# Patient Record
Sex: Female | Born: 1935 | Race: White | Hispanic: No | Smoking: Former smoker
Health system: Southern US, Community
[De-identification: ages and names within clinical notes are randomized; demographics above are authoritative.]

## PROBLEM LIST (undated history)

## (undated) DIAGNOSIS — K224 Dyskinesia of esophagus: Secondary | ICD-10-CM

## (undated) DIAGNOSIS — K222 Esophageal obstruction: Secondary | ICD-10-CM

## (undated) DIAGNOSIS — C4490 Unspecified malignant neoplasm of skin, unspecified: Secondary | ICD-10-CM

## (undated) DIAGNOSIS — Z973 Presence of spectacles and contact lenses: Secondary | ICD-10-CM

## (undated) DIAGNOSIS — E119 Type 2 diabetes mellitus without complications: Secondary | ICD-10-CM

## (undated) DIAGNOSIS — Z972 Presence of dental prosthetic device (complete) (partial): Secondary | ICD-10-CM

## (undated) DIAGNOSIS — K5190 Ulcerative colitis, unspecified, without complications: Secondary | ICD-10-CM

## (undated) DIAGNOSIS — K449 Diaphragmatic hernia without obstruction or gangrene: Secondary | ICD-10-CM

## (undated) DIAGNOSIS — K2900 Acute gastritis without bleeding: Secondary | ICD-10-CM

## (undated) DIAGNOSIS — E785 Hyperlipidemia, unspecified: Secondary | ICD-10-CM

## (undated) DIAGNOSIS — K219 Gastro-esophageal reflux disease without esophagitis: Secondary | ICD-10-CM

## (undated) DIAGNOSIS — I10 Essential (primary) hypertension: Secondary | ICD-10-CM

## (undated) DIAGNOSIS — Z86010 Personal history of colonic polyps: Secondary | ICD-10-CM

## (undated) DIAGNOSIS — K5730 Diverticulosis of large intestine without perforation or abscess without bleeding: Secondary | ICD-10-CM

## (undated) DIAGNOSIS — F0390 Unspecified dementia without behavioral disturbance: Secondary | ICD-10-CM

## (undated) HISTORY — DX: Dyskinesia of esophagus: K22.4

## (undated) HISTORY — DX: Gastro-esophageal reflux disease without esophagitis: K21.9

## (undated) HISTORY — DX: Unspecified dementia, unspecified severity, without behavioral disturbance, psychotic disturbance, mood disturbance, and anxiety: F03.90

## (undated) HISTORY — DX: Diaphragmatic hernia without obstruction or gangrene: K44.9

## (undated) HISTORY — DX: Diverticulosis of large intestine without perforation or abscess without bleeding: K57.30

## (undated) HISTORY — PX: THUMB ARTHROSCOPY: SHX2509

## (undated) HISTORY — PX: TUBAL LIGATION: SHX77

## (undated) HISTORY — PX: BUNIONECTOMY: SHX129

## (undated) HISTORY — DX: Essential (primary) hypertension: I10

## (undated) HISTORY — DX: Acute gastritis without bleeding: K29.00

## (undated) HISTORY — PX: NASAL SINUS SURGERY: SHX719

## (undated) HISTORY — DX: Ulcerative colitis, unspecified, without complications: K51.90

## (undated) HISTORY — DX: Personal history of colonic polyps: Z86.010

## (undated) HISTORY — PX: CATARACT EXTRACTION: SUR2

## (undated) HISTORY — DX: Esophageal obstruction: K22.2

## (undated) HISTORY — DX: Hyperlipidemia, unspecified: E78.5

---

## 1998-09-18 ENCOUNTER — Encounter: Payer: Self-pay | Admitting: Otolaryngology

## 1998-09-18 ENCOUNTER — Ambulatory Visit (HOSPITAL_COMMUNITY): Admission: RE | Admit: 1998-09-18 | Discharge: 1998-09-18 | Payer: Self-pay | Admitting: Otolaryngology

## 1999-02-24 ENCOUNTER — Other Ambulatory Visit: Admission: RE | Admit: 1999-02-24 | Discharge: 1999-02-24 | Payer: Self-pay | Admitting: Gastroenterology

## 1999-05-11 ENCOUNTER — Ambulatory Visit (HOSPITAL_COMMUNITY): Admission: RE | Admit: 1999-05-11 | Discharge: 1999-05-11 | Payer: Self-pay | Admitting: Obstetrics & Gynecology

## 2003-04-05 ENCOUNTER — Other Ambulatory Visit: Admission: RE | Admit: 2003-04-05 | Discharge: 2003-04-05 | Payer: Self-pay | Admitting: Family Medicine

## 2004-09-24 ENCOUNTER — Ambulatory Visit: Payer: Self-pay | Admitting: Gastroenterology

## 2006-03-30 ENCOUNTER — Ambulatory Visit: Payer: Self-pay | Admitting: Gastroenterology

## 2006-04-27 ENCOUNTER — Other Ambulatory Visit: Admission: RE | Admit: 2006-04-27 | Discharge: 2006-04-27 | Payer: Self-pay | Admitting: Family Medicine

## 2006-04-27 ENCOUNTER — Ambulatory Visit: Payer: Self-pay | Admitting: Gastroenterology

## 2006-05-17 ENCOUNTER — Encounter (INDEPENDENT_AMBULATORY_CARE_PROVIDER_SITE_OTHER): Payer: Self-pay | Admitting: Specialist

## 2006-05-17 ENCOUNTER — Ambulatory Visit: Payer: Self-pay | Admitting: Gastroenterology

## 2006-05-17 DIAGNOSIS — K449 Diaphragmatic hernia without obstruction or gangrene: Secondary | ICD-10-CM | POA: Insufficient documentation

## 2006-05-17 DIAGNOSIS — K222 Esophageal obstruction: Secondary | ICD-10-CM | POA: Insufficient documentation

## 2006-05-17 DIAGNOSIS — K5190 Ulcerative colitis, unspecified, without complications: Secondary | ICD-10-CM | POA: Insufficient documentation

## 2006-05-17 DIAGNOSIS — K5730 Diverticulosis of large intestine without perforation or abscess without bleeding: Secondary | ICD-10-CM | POA: Insufficient documentation

## 2006-06-08 ENCOUNTER — Ambulatory Visit: Payer: Self-pay | Admitting: Gastroenterology

## 2006-10-20 ENCOUNTER — Encounter: Admission: RE | Admit: 2006-10-20 | Discharge: 2006-10-20 | Payer: Self-pay | Admitting: Family Medicine

## 2007-02-06 ENCOUNTER — Ambulatory Visit: Payer: Self-pay | Admitting: Internal Medicine

## 2007-03-07 ENCOUNTER — Ambulatory Visit: Payer: Self-pay | Admitting: Internal Medicine

## 2007-03-24 ENCOUNTER — Ambulatory Visit: Payer: Self-pay | Admitting: Cardiology

## 2007-04-11 ENCOUNTER — Ambulatory Visit: Payer: Self-pay | Admitting: Internal Medicine

## 2007-06-20 ENCOUNTER — Ambulatory Visit: Payer: Self-pay | Admitting: Gastroenterology

## 2008-02-12 DIAGNOSIS — I10 Essential (primary) hypertension: Secondary | ICD-10-CM | POA: Insufficient documentation

## 2008-02-12 DIAGNOSIS — E785 Hyperlipidemia, unspecified: Secondary | ICD-10-CM | POA: Insufficient documentation

## 2008-02-12 DIAGNOSIS — K219 Gastro-esophageal reflux disease without esophagitis: Secondary | ICD-10-CM | POA: Insufficient documentation

## 2008-05-08 ENCOUNTER — Ambulatory Visit: Payer: Self-pay | Admitting: Gastroenterology

## 2008-05-14 ENCOUNTER — Ambulatory Visit: Payer: Self-pay | Admitting: Gastroenterology

## 2008-05-20 ENCOUNTER — Encounter: Payer: Self-pay | Admitting: Gastroenterology

## 2008-05-20 ENCOUNTER — Ambulatory Visit: Payer: Self-pay | Admitting: Gastroenterology

## 2008-05-23 ENCOUNTER — Telehealth: Payer: Self-pay | Admitting: Gastroenterology

## 2008-05-27 ENCOUNTER — Encounter: Payer: Self-pay | Admitting: Gastroenterology

## 2008-06-04 ENCOUNTER — Encounter: Payer: Self-pay | Admitting: Gastroenterology

## 2008-06-06 ENCOUNTER — Other Ambulatory Visit: Admission: RE | Admit: 2008-06-06 | Discharge: 2008-06-06 | Payer: Self-pay | Admitting: Family Medicine

## 2008-11-22 ENCOUNTER — Telehealth (INDEPENDENT_AMBULATORY_CARE_PROVIDER_SITE_OTHER): Payer: Self-pay | Admitting: *Deleted

## 2008-11-22 ENCOUNTER — Telehealth: Payer: Self-pay | Admitting: Gastroenterology

## 2008-12-02 ENCOUNTER — Telehealth: Payer: Self-pay | Admitting: Gastroenterology

## 2008-12-13 ENCOUNTER — Telehealth: Payer: Self-pay | Admitting: Gastroenterology

## 2009-05-30 ENCOUNTER — Telehealth: Payer: Self-pay | Admitting: Gastroenterology

## 2009-06-02 ENCOUNTER — Telehealth: Payer: Self-pay | Admitting: Gastroenterology

## 2009-06-12 ENCOUNTER — Ambulatory Visit: Payer: Self-pay | Admitting: Gastroenterology

## 2009-09-02 ENCOUNTER — Telehealth: Payer: Self-pay | Admitting: Gastroenterology

## 2009-10-17 ENCOUNTER — Emergency Department (HOSPITAL_COMMUNITY): Admission: EM | Admit: 2009-10-17 | Discharge: 2009-10-17 | Payer: Self-pay | Admitting: Family Medicine

## 2010-04-08 ENCOUNTER — Telehealth: Payer: Self-pay | Admitting: Gastroenterology

## 2010-06-17 ENCOUNTER — Encounter (INDEPENDENT_AMBULATORY_CARE_PROVIDER_SITE_OTHER): Payer: Self-pay | Admitting: *Deleted

## 2010-06-24 ENCOUNTER — Telehealth: Payer: Self-pay | Admitting: Gastroenterology

## 2010-07-20 ENCOUNTER — Encounter: Payer: Self-pay | Admitting: Gastroenterology

## 2010-07-21 ENCOUNTER — Encounter (INDEPENDENT_AMBULATORY_CARE_PROVIDER_SITE_OTHER): Payer: Self-pay | Admitting: *Deleted

## 2010-07-21 ENCOUNTER — Ambulatory Visit: Payer: Self-pay | Admitting: Gastroenterology

## 2010-09-11 ENCOUNTER — Ambulatory Visit: Payer: Self-pay | Admitting: Gastroenterology

## 2010-09-11 DIAGNOSIS — Z860100 Personal history of colon polyps, unspecified: Secondary | ICD-10-CM

## 2010-09-11 DIAGNOSIS — K2900 Acute gastritis without bleeding: Secondary | ICD-10-CM | POA: Insufficient documentation

## 2010-09-11 HISTORY — DX: Personal history of colonic polyps: Z86.010

## 2010-09-11 HISTORY — DX: Personal history of colon polyps, unspecified: Z86.0100

## 2010-09-11 LAB — CONVERTED CEMR LAB: UREASE: NEGATIVE

## 2010-09-15 ENCOUNTER — Encounter: Payer: Self-pay | Admitting: Gastroenterology

## 2010-09-29 ENCOUNTER — Telehealth: Payer: Self-pay | Admitting: Gastroenterology

## 2010-12-10 NOTE — Letter (Signed)
Summary: [facility] Instructions  [facility]  [place], [place] [address]   Phone: [phone]  Fax: [phone]       JOZLYNN PLAIA    22-Jan-1935    MRN: [id number]        Procedure Day /Date: Friday 09/11/2010     Arrival Time: 1:30pm     Procedure Time: 2:30pm    Location of Procedure:                    X  [facility] (4th Floor)  [place]   Starting 5 days prior to your procedure 09/06/2010 do not eat nuts, seeds, popcorn, corn, beans, peas,  salads, or any raw vegetables.  Do not take any fiber supplements (e.g. Metamucil, Citrucel, and Benefiber).  THE DAY BEFORE YOUR PROCEDURE         DATE: 09/10/2010  DAY: Thursday  1.  Drink clear liquids the entire day-NO SOLID FOOD  2.  Do not drink anything colored red or purple.  Avoid juices with pulp.  No orange juice.  3.  Drink at least 64 oz. (8 glasses) of fluid/clear liquids during the day to prevent dehydration and help the prep work efficiently.  CLEAR LIQUIDS INCLUDE: Water Jello Ice Popsicles Tea (sugar ok, no milk/cream) Powdered fruit flavored drinks Coffee (sugar ok, no milk/cream) Gatorade Juice: apple, white grape, white cranberry  Lemonade Clear bullion, consomm, broth Carbonated beverages (any kind) Strained chicken noodle soup Hard Candy                             4.  In the morning, mix first dose of MoviPrep solution:    Empty 1 Pouch A and 1 Pouch B into the disposable container    Add lukewarm drinking water to the top line of the container. Mix to dissolve    Refrigerate (mixed solution should be used within 24 hrs)  5.  Begin drinking the prep at 5:00 p.m. The MoviPrep container is divided by 4 marks.   Every 15 minutes drink the solution down to the next mark (approximately 8 oz) until the full liter is complete.   6.  Follow completed prep with 16 oz of clear liquid of your choice (Nothing red or purple).   Continue to drink clear liquids until bedtime.  7.  Before going to bed, mix second dose of MoviPrep solution:    Empty 1 Pouch A and 1 Pouch B into the disposable container    Add lukewarm drinking water to the top line of the container. Mix to dissolve    Refrigerate  THE DAY OF YOUR PROCEDURE      DATE: 09/11/2010 DAY: Friday  Beginning at 9:30am (5 hours before procedure):         1. Every 15 minutes, drink the solution down to the next mark (approx 8 oz) until the full liter is complete.  2. Follow completed prep with 16 oz. of clear liquid of your choice.    3. You may drink clear liquids until 12:30pm (2 HOURS BEFORE PROCEDURE).   MEDICATION INSTRUCTIONS  Unless otherwise instructed, you should take regular prescription medications with a small sip of water   as early as possible the morning of your procedure.          OTHER INSTRUCTIONS  You will need a responsible adult at least 74 years of age to accompany  you and drive you home.   This person must remain in the waiting room during your procedure.  Wear loose fitting clothing that is easily removed.  Leave jewelry and other valuables at home.  However, you may wish to bring a book to read or  an iPod/MP3 player to listen to music as you wait for your procedure to start.  Remove all body piercing jewelry and leave at home.  Total time from sign-in until discharge is approximately 2-3 hours.  You should go home directly after your procedure and rest.  You can resume normal activities the  day after your procedure.  The day of your procedure you should not:   Drive   Make legal decisions   Operate machinery   Drink alcohol   Return to work  You will receive specific instructions about eating, activities and medications before you leave.    The above instructions have been reviewed and explained to me by   _______________________    I fully understand and can verbalize these instructions  _____________________________ Date _________

## 2010-12-10 NOTE — Assessment & Plan Note (Signed)
Summary: 1 yr follow up/dfs   History of Present Illness Visit Type: Follow-up Visit Primary GI MD: Verl Blalock MD [place] Primary Provider: Serita Grammes, MD Chief Complaint: pt needs refill on Domperidone.  Pt also states she is having a lot of acid reflux on Lansoprazole History of Present Illness:   Progressive solid food dysphagia despite daily Prevacid and domperidone. 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 She denies abdominal pain, melena or hematochezia. Recent blood work has been performed at Dr. Wilburt Finlay. Last endoscopy and colonoscopy were in 2007.   GI Review of Systems    Reports acid reflux.      Denies abdominal pain, belching, bloating, chest pain, dysphagia with liquids, dysphagia with solids, heartburn, loss of appetite, nausea, vomiting, vomiting blood, weight loss, and  weight gain.        Denies anal fissure, black tarry stools, change in bowel habit, constipation, diarrhea, diverticulosis, fecal incontinence, heme positive stool, hemorrhoids, irritable bowel syndrome, jaundice, light color stool, liver problems, rectal bleeding, and  rectal pain.    Current Medications (verified): 1)  Pentasa 250 Mg  Cpcr (Mesalamine) .... Take Three By Mouth  A Day. 2)  Centrum Silver   Tabs (Multiple Vitamins-Minerals) .... One By Mouth Once Daily 3)  Calcium 500 Mg  Tabs (Calcium Carbonate) .... One By Mouth Once Daily 4)  Losartan Potassium 100 Mg Tabs (Losartan Potassium) .... Take 1 Tablet By Mouth Once A Day 5)  Domperidone 80m .... One By Mouth Three Times A Day Before Meals 6)  Lansoprazole 30 Mg Cpdr (Lansoprazole) .... Take One By Mouth Once Daily 7)  Lipitor 20 Mg Tabs (Atorvastatin Calcium) .... Take 1 Tablet By Mouth Once A Day 8)  Amlodipine Besylate 5 Mg Tabs (Amlodipine Besylate) .... Take 1 Tablet By Mouth Once A Day 9)  Vitamin C 500 Mg Tabs (Ascorbic Acid) .... Take 1 Tablet By Mouth Once A Day 10)  Vitamin D 1000 Unit  Tabs (Cholecalciferol) .... Take 1 Tablet By Mouth Once A Day 11)  Reclast 5 Mg/1043mSoln (Zoledronic Acid) .... Take One Injection Once Yearly  Allergies (verified): 1)  ! Codeine  Past History:  Past medical, surgical, family and social histories (including risk factors) reviewed for relevance to current acute and chronic problems.  Past Medical History: Reviewed history from 02/12/2008 and no changes required. 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 No FH of Colon Cancer: Family History of Thyroid Cancer: Brother ? mets to brain Family History of Diabetes: Mother, Sister  Social History: Reviewed history from 05/14/2008 and no changes required. married, 1 boy, 1 girl Occupation: semi-retired Patient is a former smoker.  Alcohol Use - no Daily Caffeine Use 1 cup/day Illicit Drug Use - no Patient does not get regular exercise.   Review of Systems       The patient complains of arthritis/joint pain.  The patient denies allergy/sinus, anemia, anxiety-new, back pain, blood in urine, breast changes/lumps, confusion, cough, coughing up blood, depression-new, fainting, fatigue, fever, headaches-new, hearing problems, heart murmur, heart rhythm changes, itching, menstrual pain, muscle pains/cramps, night sweats, nosebleeds, pregnancy symptoms, shortness of breath, skin rash, sleeping problems, sore throat, swelling of feet/legs, swollen lymph glands, thirst - excessive, urination - excessive, urination changes/pain, urine leakage,  vision changes, and voice change.         Recently prescribed Celebrex for hand arthritis.  Vital Signs:  Patient  profile:   76 year old female Height:      63 inches Weight:      139 pounds BMI:     24.71 Pulse rate:   76 / minute Pulse rhythm:   regular BP sitting:   130 / 72  (left arm) Cuff size:   regular  Vitals Entered By: Abelino Derrick CMA Deborra Medina) (July 21, 2010 11:39 AM)  Physical Exam  General:  Well developed, well nourished, no acute distress.healthy appearing.   Head:  Normocephalic and atraumatic. Eyes:  PERRLA, no icterus.exam deferred to patient's ophthalmologist.   Lungs:  Clear throughout to auscultation. Heart:  Regular rate and rhythm; no murmurs, rubs,  or bruits. Abdomen:  Soft, nontender and nondistended. No masses, hepatosplenomegaly or hernias noted. Normal bowel sounds. Rectal:  deferred until time of colonoscopy.   Msk:  arthritic changes.   Extremities:  No clubbing, cyanosis, edema or deformities noted. Neurologic:  Alert and  oriented x4;  grossly normal neurologically. Psych:  Alert and cooperative. Normal mood and affect.   Impression & Recommendations:  Problem # 1:  GERD (ICD-530.81) Assessment Deteriorated Change to Dexilant 60 mg a day and add scheduled followup endoscopy and esophageal dilatation. She also can stop her domperidone since she has had no response to this medication. Orders: Colon/Endo (Colon/Endo)  Problem # 2:  COLITIS, ULCERATIVE (ICD-556.9) Assessment: Improved Schedule Colonoscopy with dysplasia screening biopsies. Continued Pentasa at current doses. Labs requested from Dr. Raul Del office. I see no problem with her using p.o. Celebrex a conventional doses. Orders: Colon/Endo (Colon/Endo)  Patient Instructions: 1)  Copy sent to : Serita Grammes, MD 2)  Stop Domperidone and Prevacid.  3)  Try the samples of Dexilant 51m one by mouth once daily, samples provided. 4)  Your procedure has been scheduled for 09/11/2010, please follow the seperate instructions.  5)  Your prescription(s) have been sent to you pharmacy.  6)  The  medication list was reviewed and reconciled.  All changed / newly prescribed medications were explained.  A complete medication list was provided to the patient / caregiver. Prescriptions: MOVIPREP 100 GM  SOLR (PEG-KCL-NACL-NASULF-NA ASC-C) As per prep instructions.  #1 x 0   Entered by:   LBernita BuffyCMA (A[place]. Bernard   Authorized by:   DSable FeilMD [facility]  Signed by:   LBernita BuffyCMA ([place] on 07/21/2010   Method used:   [facility]ronically to        [facility]       [address] [place]  [address]      Ph: [phone]      Fax: [phone]  RxID:   [id number]

## 2010-12-10 NOTE — Progress Notes (Signed)
Summary: rx refill   Phone Note From Pharmacy Call back at [phone]   Caller: susie [facility] [place]  Call For: [facility]  Summary of Call: Patient needs rx refill for Domperidon Initial call taken by: Ronalee Red,  June 24, 2010 1:17 PM  Follow-up for Phone Call        Rx faxed to [facility] [place] at [phone].   Pt needs to sch OV.   Appt scheduled.  Follow-up by: Alberteen Spindle RN,  June 24, 2010 3:06 PM    Prescriptions: DOMPERIDONE 10MG one by mouth three times a day before meals  #100 x 3   Entered by:   Alberteen Spindle RN   Authorized by:   Sable Feil MD [facility]   Signed by:   Alberteen Spindle RN on 06/24/2010   Method used:   Printed then faxed to ...       [facility] (retail)       [address] [place], [place]  [address]       Ph: [phone] or [phone]       Fax: [phone]   RxID:   [id number]

## 2010-12-10 NOTE — Procedures (Signed)
Summary: Colonoscopy  Patient: Erica Carter Note: All result statuses are Final unless otherwise noted.  Tests: (1) Colonoscopy (COL)   COL Colonoscopy           [facility].     [place], [place]  [address]           COLONOSCOPY PROCEDURE REPORT           PATIENT:  Erica Carter, Erica Carter  MR#:  [id number]     BIRTHDATE:  [birth year], 75 yrs. old  GENDER:  female     ENDOSCOPIST:  Loralee Pacas. Sharlett Iles, MD, [facility]     REF. BY:  Shon Baton, M.D.     [name], M.D.     PROCEDURE DATE:  09/11/2010     PROCEDURE:  Colonoscopy with biopsy     ASA CLASS:  Class II     INDICATIONS:  CHRONIC ULCERATIVE COLITIS.DYSPLASIA SCREENING EXAM.           MEDICATIONS:   Fentanyl 50 mcg IV, Versed 6 mg IV           DESCRIPTION OF PROCEDURE:   After the risks benefits and     alternatives of the procedure were thoroughly explained, informed     consent was obtained.  Digital rectal exam was performed and     revealed no abnormalities.   The LB CF-H180AL [id number] endoscope     was introduced through the anus and advanced to the cecum, which     was identified by both the appendix and ileocecal valve, limited     by a redundant colon, poor preparation.    The quality of the prep     was adequate, using MoviPrep.  The instrument was then slowly     withdrawn as the colon was fully examined.     <<PROCEDUREIMAGES>>           FINDINGS:  pseudopolyps in the right colon. MULTIPLE FLAT     PSEUDOPOLYPS BIOPSIED.SEE PICTURES.NO ACTIVE COLITIS.  other     finding. ATROPHIC SCARRED MUCOSA IN TV AND LEFT     COLON.BIOPSIED.MINIMAL INFLAMMATION NOTED AND SCATTERED     PSEUDOPOLYPS ALSO NOTED.  Severe diverticulosis was found in the     sigmoid to descending colon segments.   Retroflexed views in the     rectum revealed internal hemorrhoids.    The scope was then     withdrawn from the patient and the procedure completed.           COMPLICATIONS:  None     ENDOSCOPIC  IMPRESSION:     1) Pseudopolyps in the right colon     2) Other finding     3) Severe diverticulosis in the sigmoid to descending colon     segments     4) Internal hemorrhoids     CHRONIC UC.R/O DYSPLASIA.     RECOMMENDATIONS:     1) high fiber diet     2) Await pathology results     3) Continue current medications     4) Repeat Colonoscopy in 3 years.     REPEAT EXAM:  No           ______________________________     Loralee Pacas. Sharlett Iles, MD, [facility]           CC:           n.     eSIGNED:   Loralee Pacas.  Patterson at 09/11/2010 03:07 PM           Elicia Lamp, [id number]  Note: An exclamation mark (!) indicates a result that was not dispersed into the flowsheet. Document Creation Date: 09/11/2010 3:08 PM _______________________________________________________________________  (1) Order result status: Final Collection or observation date-time: 09/11/2010 14:53 Requested date-time:  Receipt date-time:  Reported date-time:  Referring Physician:   Ordering Physician: Verl Blalock [id number]) Specimen Source:  Source: Tawanna Cooler Order Number: [id number] Lab site:   Appended Document: Colonoscopy     Procedures Next Due Date:    Colonoscopy: 09/2015

## 2010-12-10 NOTE — Progress Notes (Signed)
Summary: Lansoprazole Refill   Phone Note From Pharmacy   Summary of Call: [facility] fax received requesting refill on lansoprazole 30 mg #90. Initial call taken by: Alberteen Spindle RN,  April 08, 2010 3:47 PM    Prescriptions: LANSOPRAZOLE 30 MG CPDR (LANSOPRAZOLE) take one by mouth once daily  #90 x 4   Entered by:   Alberteen Spindle RN   Authorized by:   Sable Feil MD [facility]   Signed by:   Alberteen Spindle RN on 04/08/2010   Method used:   Electronically to        [facility] (mail-order)             ,          Ph: [phone]       Fax: [phone]   RxID:   [id number]

## 2010-12-10 NOTE — Letter (Signed)
Summary: Patient [facility] Biopsy Results  [facility]  [place], [place] [address]   Phone: [phone]  Fax: [phone]        September 15, 2010 MRN: [id number]    [place], [place]  [address]    Dear Ms. Jaworski,  I am pleased to inform you that the biopsies taken during your recent endoscopic examination did not show any evidence of cancer upon pathologic examination.  Additional information/recommendations:  __No further action is needed at this time.  Please follow-up with      your primary care physician for your other healthcare needs.  __ Please call [phone] to schedule a return visit to review      your condition.  x__ Continue with the treatment plan as outlined on the day of your      exam.  __ You should have a repeat endoscopic examination for this problem              in _ months/years.   Please call us if you are having persistent problems or have questions about your condition that have not been fully answered at this time.  Sincerely,  Sable Feil MD [facility]  This letter has been electronically signed by your physician.  Appended Document: Patient Notice-Endo Biopsy Results letter mailed

## 2010-12-10 NOTE — Progress Notes (Signed)
Summary: rx ?'s   Phone Note Call from Patient Call back at Home Phone [phone]   Caller: Patient Call For: Dr. Sharlett Iles Reason for Call: Talk to Nurse Summary of Call: ?'s regarding Pentasa refill Initial call taken by: Lucien Mons,  September 29, 2010 11:12 AM  Follow-up for Phone Call        rx resent to [facility]. Follow-up by: Bernita Buffy CMA Deborra Medina),  September 29, 2010 11:33 AM    Prescriptions: PENTASA 250 MG  CPCR (MESALAMINE) take three by mouth  a day.  #270 Capsule x 2   Entered by:   Bernita Buffy CMA ([place])   Authorized by:   Sable Feil MD [facility]   Signed by:   Bernita Buffy CMA ([place]) on 09/29/2010   Method used:   Electronically to        [facility] (retail)             ,          Ph: [phone]       Fax: [phone]   RxID:   [id number]

## 2010-12-10 NOTE — Letter (Signed)
Summary: Colonoscopy Date Change Letter  [facility]  [place], [place] [address]   Phone: [phone]  Fax: [phone]      June 17, 2010 MRN: [id number]   [place], [place]  [address]   Dear Ms. Wheeler,   Previously you were recommended to have a repeat colonoscopy around this time. Your chart was recently reviewed by Dr. Sharlett Iles of [facility]. Follow up colonoscopy is now recommended in July 2012. This revised recommendation is based on current, nationally recognized guidelines for colorectal cancer screening and polyp surveillance. These guidelines are endorsed by the [place] Task Force on Colorectal Cancer as well as numerous other major medical organizations.  Please understand that our recommendation assumes that you do not have any new symptoms such as bleeding, a change in bowel habits, anemia, or significant abdominal discomfort. If you do have any concerning GI symptoms or want to discuss the guideline recommendations, please call to arrange an office visit at your earliest convenience. Otherwise we will keep you in our reminder system and contact you 1-2 months prior to the date listed above to schedule your next colonoscopy.  Thank you,   Loralee Pacas. Sharlett Iles, M.D.  [facility] [phone]

## 2010-12-10 NOTE — Letter (Signed)
Summary: Patient Notice- Colon Biospy Results  [name]  [place], [place] [address]   Phone: [phone]  Fax: [phone]        September 15, 2010 MRN: [id number]    [place], [place]  [address]    Dear Erica Carter,  I am pleased to inform you that the biopsies taken during your recent colonoscopy did not show any evidence of cancer upon pathologic examination.  Additional information/recommendations:  __No further action is needed at this time.  Please follow-up with      your primary care physician for your other healthcare needs.  __Please call [phone] to schedule a return visit to review      your condition.  _x_Continue with the treatment plan as outlined on the day of your      exam.  _x_You should have a repeat colonoscopy examination for this problem           in 5_ years.  Please call us if you are having persistent problems or have questions about your condition that have not been fully answered at this time.  Sincerely,  Sable Feil MD [facility]   This letter has been electronically signed by your physician.  Appended Document: Patient Notice- Colon Biospy Results letter mailed

## 2010-12-10 NOTE — Procedures (Signed)
Summary: Upper Endoscopy  Patient: [name] Note: All result statuses are Final unless otherwise noted.  Tests: (1) Upper Endoscopy (EGD)   EGD Upper Endoscopy       [facility].     [place], [place]  [address]           ENDOSCOPY PROCEDURE REPORT           PATIENT:  Erica, Carter  MR#:  [id number]     BIRTHDATE:  Nov 10, 1934, 75 yrs. old  GENDER:  female           ENDOSCOPIST:  Loralee Pacas. Sharlett Iles, MD, [facility]     Referred by:           PROCEDURE DATE:  09/11/2010     PROCEDURE:  EGD with biopsy, [address], Maloney Dilation of Esophagus     ASA CLASS:  Class II     INDICATIONS:  dysphagia           MEDICATIONS:   There was residual sedation effect present from     prior procedure., Versed 2 mg IV     TOPICAL ANESTHETIC:  Exactacain Spray           DESCRIPTION OF PROCEDURE:   After the risks benefits and     alternatives of the procedure were thoroughly explained, informed     consent was obtained.  The LB GIF-H180 [id number] endoscope was     introduced through the mouth and advanced to the esophagus lower,     without limitations.  The instrument was slowly withdrawn as the     mucosa was fully examined.     <<PROCEDUREIMAGES>>           A sessile polyp was found in the antrum. PROMINANT ANTRAL     INFLAMMATORY POLYP BIOPSIED.CLO BX. ALSO DONE.  Normal duodenal     folds were noted.  The esophagus and gastroesophageal junction     were completely normal in appearance. 3 CM. HH NOTED Dilation with     maloney dilator 37m TOLERATED WELL.    Retroflexed views revealed     no abnormalities.    The scope was then withdrawn from the patient     and the procedure completed.           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     RECOMMENDATIONS:     1) Await biopsy results     2) Clear liquids  until, then soft foods rest iof day. Resume     prior diet tomorrow.     3) Rx CLO if positive     4) continue current medications           REPEAT EXAM:  No           ______________________________     DLoralee Pacas PSharlett Iles MD, FMarval Regal          CC:  KMayra Neer MD           n.     eLorrin MaisMarland Kitchen  DLoralee Pacas Patterson at 09/11/2010 03:14 PM           VElicia Carter [phone] Note: An exclamation mark (Marland Kitchen  indicates a result that was not dispersed into the flowsheet. Document Creation Date: 09/11/2010 3:14 PM _______________________________________________________________________  (1) Order result status: Final Collection or observation date-time: 09/11/2010 15:01 Requested date-time:  Receipt date-time:  Reported date-time:  Referring Physician:   Ordering Physician: Verl Blalock [id number]) Specimen Source:  Source: Tawanna Cooler Order Number: [id number] Lab site:

## 2010-12-10 NOTE — Miscellaneous (Signed)
Summary: clo test  Clinical Lists Changes  Problems: Added new problem of GASTRITIS, ACUTE W/O HEMORRHAGE (ICD-535.00) Orders: Added new Test order of TLB-H Pylori Screen Gastric Biopsy (83013-CLOTEST) - Signed

## 2011-01-12 ENCOUNTER — Telehealth: Payer: Self-pay | Admitting: Gastroenterology

## 2011-01-19 NOTE — Progress Notes (Signed)
Summary: Medication   Phone Note Call from Patient Call back at Home Phone [phone]   Caller: Patient Call For: Dr. Sharlett Iles Reason for Call: Talk to Nurse Summary of Call: Ins. will not cover [place]....needs an alternative med.  [place] Initial call taken by: Webb Laws,  January 12, 2011 4:40 PM  Follow-up for Phone Call        changed to Protonix,  Follow-up by: Bernita Buffy CMA Deborra Medina),  January 12, 2011 4:46 PM    New/Updated Medications: PANTOPRAZOLE SODIUM 40 MG TBEC (PANTOPRAZOLE SODIUM) take one by mouth once daily Prescriptions: PANTOPRAZOLE SODIUM 40 MG TBEC (PANTOPRAZOLE SODIUM) take one by mouth once daily  #90 x 3   Entered by:   Bernita Buffy CMA ([place])   Authorized by:   Sable Feil MD [facility]   Signed by:   Bernita Buffy CMA ([place]) on 01/12/2011   Method used:   Electronically to        [facility] (retail)             ,          Ph: [phone]       Fax: [phone]   RxID:   [id number]

## 2011-03-23 NOTE — Assessment & Plan Note (Signed)
[facility]                             PULMONARY OFFICE NOTE   NAME:Erica Carter, Erica Carter                      MRN:          [id number]  DATE:04/11/2007                            DOB:          [birth year]    HISTORY:  A 76 year old white female who states she has been coughing  for over 20 years.  Much better off ACE inhibitors, and on PPI therapy  when I saw her on March 07, 2007, and even better now.  She only coughs  2 or 3 times a week, typically and hour after lying down at night, but  it does not bother her.  She denies any overt sinus or reflux symptoms  presently, fevers, chills, sweats, orthopnea, PND, or leg swelling.   Her only medicine she is taking for reflux is Zegerid at bedtime.  She  does not use anymore cough suppressants.   PHYSICAL EXAMINATION:  She is a pleasant, ambulatory, white female, in  no acute distress.  She had stable vital signs.  Her blood pressure was 120/80, having taken  Benicar 40 one daily since her last visit.  HEENT:  Reveals minimal turbinate edema.  Oropharynx is clear with no  cobblestoning or evidence of postnasal drainage.  NECK:  Supple without cervical adenopathy or tenderness.  Trachea is  midline.  LUNG FIELDS:  Perfectly clear bilaterally to auscultation and  percussion.  There is no cough elicited on inspiratory or expiratory  maneuvers.  HEART:  Regular rate and rhythm without murmur, gallop, or rub.  ABDOMEN:  Soft and benign.  EXTREMITIES:  Warm without calf tenderness, cyanosis, clubbing, or  edema.   Sinus CT scan was reviewed with this patient from Mar 24, 2007, and  shows thickening only with no evidence of acute sinusitis or air fluid  levels.  Chest x-ray from February 06, 2007 reveals minimal scarring in the  right base with mild eventration of the anterior portion of the right  hemidiaphragm.   Barium swallow dated September 18, 1998 today was reviewed, and indicates  moderate esophageal  dysmotility.   Upper endoscopy was reviewed from May 17, 2006, and shows hiatal hernia  as well as a lower esophageal ring (Dr. Lyla Son).   IMPRESSION:  Almost complete resolution of cough by treating the acid  component of reflux aggressively.  I am going to recommend adding Reglan  empirically, 10 mg at bedtime to see if the additional component of  reflux, which is nonacid, can be treated more aggressively, and  eliminate the cough all together.  If not, I would consider adding  Benadryl 25 mg at bedtime for postnasal drip syndrome.   However, because reflux is the only cause for chronic cough, it actually  can cause or destabilize the other 2 problems (asthma and postnasal  drainage).  I want to treat the reflux component aggressively first, and  then add Benadryl secondly.  If the Benadryl eliminates the cough, then  it might be worth having her return here to our allergy department (Dr.  Baird Lyons) to evaluate her for possible allergic-based rhinitis.  I gave her samples of Benicar though for 6 weeks, and asked her to  return to Dr. Roderick Pee for further blood pressure management.  She  wanted to use generics, which are not available, as I understand, in the  ARB market presently, and I told her that there are many different ways  to treat blood pressure, but she probably should avoid ACE inhibitors  given her history.   Pulmonary followup can be p.r.n.     Christena Deem. Melvyn Novas, MD, [facility]  Electronically Signed    MBW/MedQ  DD: 04/11/2007  DT: 04/11/2007  Job #: [id number]   cc:   Osvaldo Human, M.D.

## 2011-03-23 NOTE — Assessment & Plan Note (Signed)
[facility] OFFICE NOTE   NAME:VADENHonestii, Marton                      MRN:          [id number]  DATE:06/20/2007                            DOB:          05/14/1935    Maura is a patient of Dr. Leanora Cover who has come in today because she  continues to have a cough felt secondary to acid reflux.  She is on  Zegerid 40 mg at bedtime and Reglan 10 mg at bedtime.  She also carries  a history of idiopathic inflammatory bowel disease, a negative  colonoscopy in July of 2007.  The patient did apparently have some  pseudopolyps noted at the time of colonoscopy, but all biopsies were  entirely normal.   She is only taking Pentasa 750 mg once a day.  She denies diarrhea,  rectal bleeding, abdominal pain, or systemic complaints.   EXAM:  VITAL SIGNS:  All normal.  General physical exam was not performed.   RECOMMENDATIONS:  1. Continue empiric low-dose immuno-salicylate therapy at current      levels.  2. Since she still has cough symptoms, which is probably an      extraesophageal manifestation of GERD, I will place her on Zegerid      40 mg twice a day 30 minutes before breakfast and supper with      Reglan at 10 mg at bedtime.  3. GI followup in 3 to 4 months' time.     Loralee Pacas. Sharlett Iles, MD, [facility], [facility]  Electronically Signed    DRP/MedQ  DD: 06/20/2007  DT: 06/21/2007  Job #: [id number]   cc:   Christena Deem. Melvyn Novas, MD, FCCP

## 2011-03-26 NOTE — Assessment & Plan Note (Signed)
[facility]                             PULMONARY OFFICE NOTE   NAME:Erica Carter, Erica Carter                      MRN:          [id number]  DATE:02/06/2007                            DOB:          Jul 06, 1935    HISTORY:  This is a 76 year old white female with sinus all her life  and coughing for about 20 years, worse when she lies down at night,  typically dry in nature, associated with acid heartburn for which she  takes Nexium daily without much help. She does have a history of  ulcerative colitis and has seen Dr. Lyla Son before per the chart,  her chart is not available today for review.   The patient states that she is tired of coughing and that is why she  is here now after all these years. It has been gradually worsening to  the point where she has trouble sleeping at night. Within an hour or 2  of lying down she begins having severe coughing paroxysms and  occasionally this takes her voice away as well. She denies any excess  sputum production, fevers, chills, sweats, chest pain, leg swelling, or  dyspnea.   She denies any change in dyspnea or sinus complaints with weather or  environmental changes or any obvious mitigating or alleviating factors.  She definitely is worse when she uses cough drops containing menthol but  denies anything that really else that obviously triggers the cough or  alleviates it.   PAST MEDICAL HISTORY:  Significant for hypertension, for which she is  presently on ACE inhibitors but says these have only been present for a  few years. She is also status post remote sinus surgery, tubal ligation,  and has history of ulcerative colitis.   ALLERGIES:  CODEINE.   MEDICATIONS:  Include; Pentasa, Nexium, lisinopril, Lipitor, Evista,  centrum silver, and calcium.   SOCIAL HISTORY:  She quit smoking 15 years ago. She works as a Scientist, clinical (histocompatibility and immunogenetics) at  [employer] with no unusual travel, pet, or hobby exposure.   FAMILY  HISTORY:  Positive for allergies in her sister. Otherwise  negative for respiratory disease.   REVIEW OF SYSTEMS:  Negative and taken in detail in the worksheet and  negative except outlined above.   PHYSICAL EXAMINATION:  This is a slightly anxious white female in no  acute distress. She is afebrile, stable vital signs.  HEENT: Significant for classic voice fatigue with squeaky quality voice.  Nasal turbinates reveal minimal nonspecific edema/erythema with no  pallor, polyps or cyanosis. Ear canals clear bilaterally.  NECK: Supple without Cervical adenopathy or tenderness, dentition was  intact.  LUNG FIELDS: Completely clear bilaterally to auscultation  and  percussion, with no cough elicited on inspiratory or expiratory  respirations.  There is a regular rate and rhythm without murmur, gallop, rub.  ABDOMEN: Soft, benign.  EXTREMITIES: Warm without calf tenderness, cyanosis, clubbing, or edema.   Chest x-ray is pending.   IMPRESSION:  Chronic rhinitis with perhaps even an element of sinusitis  causing nocturnal cough much worse in the last year or 2, probably due  to the coexistence of reflux and ACE inhibitors. This is a perfect  storm for upper airway instability in that the rhinitis/sinusitis and  reflux serve to inflame the upper airway and the inflammation goes  unchecked because  lisinopril interferes with bradykinin a potent  mediator of upper airway inflammation. Therefore there is no right  answer as to what is causing her cough because the 3 are combining  together to destabilize the upper airway in a synergistic fashion. For  now I thought I would it best to maximize  treatment with reflux by  asking her to take Nexium perfectly regularly before meals twice daily  (and reviewed a diet with her) switch lisinopril to Benicar 40 mg per  day and have her come back with a sinus CT scan if not impressed with  the benefit from the above changes.     Christena Deem. Melvyn Novas, MD,  [facility]  Electronically Signed    MBW/MedQ  DD: 02/06/2007  DT: 02/06/2007  Job #: [id number]   cc:   Osvaldo Human, M.D.

## 2011-03-26 NOTE — Assessment & Plan Note (Signed)
[place] OFFICE NOTE   HEILEY, SHAIKH                      MRN:          [id number]  DATE:06/08/2006                            DOB:          03-23-1935    Erica Carter comes in.  We reviewed her recent colonoscopy and upper endoscopic  examination where I dilated her.  She says she is better since I dilated  here.  Swallowing has been better.  She has been taking Nexium but her main  problem is paying for the medication unfortunately.  I told her since the  biopsies of her colon did not reflect significant inflammatory process,  although there was certainly some old chronic changes but there was no  dysplastic process that would be okay if we cut her down to two Pentasa  b.i.d.  Hopefully this will be satisfactory for her.  She should also  continue on her other medications including the Nexium.  She is taking one  every day in the morning before breakfast.  I told her we could certainly  refill these as needed and I want to see her back within a year unless she  has further difficulty.   IMPRESSION:  1.  Chronic ulcerative colitis.  2.  Gastroesophageal reflux disease with esophageal ring, chronic reflux and      gastritis.   RECOMMENDATIONS:  She is to continue her medications as is.                                   Clarene Reamer, MD   SML/MedQ  DD:  06/08/2006  DT:  06/08/2006  Job #:  [id number]   cc:   Osvaldo Human, MD

## 2011-03-26 NOTE — Assessment & Plan Note (Signed)
[facility]                             PULMONARY OFFICE NOTE   NAME:VADENVernesha, Talbot                      MRN:          [id number]  DATE:03/07/2007                            DOB:          07-May-1935    PULMONARY EXTENDED FOLLOW-UP OFFICE VISIT:   HISTORY:  A 76 year old white female reports she has been coughing for  20 years, better after her last visit here on March 31.  Off of ACE  inhibitors and on PPI therapy regularly.  Initially, at a dose of b.i.d.  Nexium; however, she improved and reduced the Nexium dose down to one  daily and then within the next week, developed a cold with sore  throat, congested cough with yellow mucus production, and nasal  congestion.  She denies any increased dyspnea, pleuritic pain, fevers,  chills, sweats, orthopnea, PND, or leg swelling.   PHYSICAL EXAMINATION:  GENERAL:  She is a pleasant, ambulatory white  female in no acute distress.  VITAL SIGNS:  She is afebrile with normal vital signs.  HEENT:  Unremarkable.  Her oropharynx is clear.  LUNGS:  Perfectly clear bilaterally to auscultation and percussion.  HEART:  Regular rhythm without murmur, rub or gallop.  ABDOMEN:  Soft, benign.  EXTREMITIES:  Warm without calf tenderness, clubbing, cyanosis or edema.   IMPRESSION:  Chronic cough dating back 20 years.  Better but not  completely resolved, after eliminating ACE inhibitors and initiating  high-dose proton pump inhibitor therapy.  My instructions specifically  were for her to maintain the high doses of proton pump inhibitor until  we were sure that the ACE inhibitor was out of her system and that acid  reflux was not a primary driving force behind the cough.  She admits  that when she does take Nexium, she has a hard time remembering to take  it before meals; therefore, I am going to change her instructions today  and gave her in writing the following recommendations:   RECOMMENDATIONS:  1. Continue acid  suppression in the form of Zegerid 40 mg at bedtime.  2. Continue off of ACE inhibitors.  3. Benicar 40 mg daily, noting that her blood pressure is excellent on      this regimen.  4. Control cyclical cough, recommending Mucinex DM 1-2 every 12 hours.  5. Supplement with tramadol 50 mg every 4 hours.  6. Prednisone 10 mg tablets, #14, to be tapered off over 6 days.  7. As she does have apparent acute rhinitis with congestive cough, I      recommended switching to Mucinex DM, as noted above, but also gave      her a 7-day course of doxycycline with the      recommendation that she call back for a sinus CT scan if not seeing      significant improvement on empiric doxycycline b.i.d. for 7 days.     Christena Deem. Melvyn Novas, MD, [facility]  Electronically Signed    MBW/MedQ  DD: 03/07/2007  DT: 03/08/2007  Job #: [id number]   cc:   Osvaldo Human, M.D.

## 2011-06-28 ENCOUNTER — Other Ambulatory Visit: Payer: Self-pay | Admitting: Gastroenterology

## 2011-10-20 ENCOUNTER — Other Ambulatory Visit: Payer: Self-pay | Admitting: Gastroenterology

## 2011-10-22 ENCOUNTER — Other Ambulatory Visit: Payer: Self-pay | Admitting: Gastroenterology

## 2011-10-22 MED ORDER — MESALAMINE ER 250 MG PO CPCR: ORAL_CAPSULE | ORAL | Status: DC

## 2011-10-22 NOTE — Telephone Encounter (Signed)
Advised pt she needs ov and i will send a one month supply.

## 2011-10-26 ENCOUNTER — Encounter: Payer: Self-pay | Admitting: Gastroenterology

## 2011-10-26 ENCOUNTER — Other Ambulatory Visit (INDEPENDENT_AMBULATORY_CARE_PROVIDER_SITE_OTHER): Payer: Medicare Other

## 2011-10-26 ENCOUNTER — Ambulatory Visit (INDEPENDENT_AMBULATORY_CARE_PROVIDER_SITE_OTHER): Payer: Medicare Other | Admitting: Gastroenterology

## 2011-10-26 VITALS — BP 128/72 | HR 80 | Ht 62.0 in | Wt 134.0 lb

## 2011-10-26 DIAGNOSIS — K5190 Ulcerative colitis, unspecified, without complications: Secondary | ICD-10-CM

## 2011-10-26 DIAGNOSIS — Z79899 Other long term (current) drug therapy: Secondary | ICD-10-CM

## 2011-10-26 DIAGNOSIS — Z8719 Personal history of other diseases of the digestive system: Secondary | ICD-10-CM

## 2011-10-26 LAB — IBC PANEL
Iron: 50 ug/dL (ref 42–145)
Saturation Ratios: 14.3 % — ABNORMAL LOW (ref 20.0–50.0)
Transferrin: 250.6 mg/dL (ref 212.0–360.0)

## 2011-10-26 LAB — FERRITIN: Ferritin: 22.3 ng/mL (ref 10.0–291.0)

## 2011-10-26 MED ORDER — MESALAMINE ER 250 MG PO CPCR: ORAL_CAPSULE | ORAL | Status: DC

## 2011-10-26 NOTE — Patient Instructions (Signed)
Your prescription(s) have been sent to you pharmacy.  Please go to the basement today for your labs.

## 2011-10-26 NOTE — Progress Notes (Signed)
This is a very nice 76 year old Caucasian female with chronic ulcerative colitis in remission on Pentasa 750 mg a day. In fact, she has chronic constipation and uses a herbal colon cleanser with good response. She specifically denies rectal bleeding, abdominal pain, or new systemic complaints. Patient does take Protonix 40 mg a day for chronic GERD. She is up-to-date her endoscopy today if colonoscopy, and has no history of colonic dysplasia. Patient takes blood pressure medication and a variety of vitamin and mineral supplements.  Current Medications, Allergies, Past Medical History, Past Surgical History, Family History and Social History were reviewed in [employer] record.  Pertinent Review of Systems Negative   Physical Exam: Awake and alert no acute distress appearing her stated age. I cannot appreciate stigmata of chronic liver disease. Chest is clear, she is in a regular rhythm without murmurs gallops or rubs. There is no abdominal distention, organomegaly, masses or tenderness. Bowel sounds are normal. Peripheral extremities are unremarkable and mental status is normal    Assessment and Plan: Ulcerative colitis in remission, continue current medications. Her GERD is well controlled with PPI therapy which we also will continue. We will see her yearly or when necessary as needed. Encounter Diagnoses  Name Primary?  . Ulcerative colitis, unspecified Yes  . History of gastroesophageal reflux (GERD)

## 2011-10-27 ENCOUNTER — Telehealth: Payer: Self-pay | Admitting: *Deleted

## 2011-10-27 NOTE — Telephone Encounter (Signed)
Message copied by Sheral Flow on Wed Oct 27, 2011  9:05 AM ------      Message from: Sable Feil      Created: Tue Oct 26, 2011  5:31 PM       B12 SHOTS ASAP AND CHRONIC RX.Marland KitchenMarland Kitchen

## 2011-10-29 ENCOUNTER — Other Ambulatory Visit: Payer: Self-pay | Admitting: Gastroenterology

## 2011-10-29 DIAGNOSIS — E538 Deficiency of other specified B group vitamins: Secondary | ICD-10-CM

## 2011-10-29 MED ORDER — CYANOCOBALAMIN 1000 MCG/ML IJ SOLN
1000.0000 ug | INTRAMUSCULAR | Status: AC
  Administered 2011-11-04 – 2011-11-18 (×3): 1000 ug via INTRAMUSCULAR

## 2011-10-29 NOTE — Telephone Encounter (Signed)
Pt aware.

## 2011-11-04 ENCOUNTER — Ambulatory Visit (INDEPENDENT_AMBULATORY_CARE_PROVIDER_SITE_OTHER): Payer: Medicare Other | Admitting: Gastroenterology

## 2011-11-04 DIAGNOSIS — E538 Deficiency of other specified B group vitamins: Secondary | ICD-10-CM

## 2011-11-11 ENCOUNTER — Ambulatory Visit (INDEPENDENT_AMBULATORY_CARE_PROVIDER_SITE_OTHER): Payer: Medicare Other | Admitting: Gastroenterology

## 2011-11-11 DIAGNOSIS — E538 Deficiency of other specified B group vitamins: Secondary | ICD-10-CM

## 2011-11-17 ENCOUNTER — Other Ambulatory Visit: Payer: Self-pay | Admitting: *Deleted

## 2011-11-17 MED ORDER — PANTOPRAZOLE SODIUM 40 MG PO TBEC: 40.0000 mg | DELAYED_RELEASE_TABLET | Freq: Every day | ORAL | Status: DC

## 2011-11-18 ENCOUNTER — Ambulatory Visit (INDEPENDENT_AMBULATORY_CARE_PROVIDER_SITE_OTHER): Payer: Medicare Other | Admitting: Gastroenterology

## 2011-11-18 DIAGNOSIS — E538 Deficiency of other specified B group vitamins: Secondary | ICD-10-CM

## 2011-12-16 ENCOUNTER — Ambulatory Visit (INDEPENDENT_AMBULATORY_CARE_PROVIDER_SITE_OTHER): Payer: Medicare Other | Admitting: Gastroenterology

## 2011-12-16 DIAGNOSIS — E538 Deficiency of other specified B group vitamins: Secondary | ICD-10-CM

## 2011-12-16 MED ORDER — CYANOCOBALAMIN 1000 MCG/ML IJ SOLN
1000.0000 ug | INTRAMUSCULAR | Status: AC
  Administered 2011-12-16 – 2012-05-25 (×6): 1000 ug via INTRAMUSCULAR

## 2012-01-20 ENCOUNTER — Ambulatory Visit (INDEPENDENT_AMBULATORY_CARE_PROVIDER_SITE_OTHER): Payer: Medicare Other | Admitting: Gastroenterology

## 2012-01-20 DIAGNOSIS — E538 Deficiency of other specified B group vitamins: Secondary | ICD-10-CM

## 2012-02-24 ENCOUNTER — Ambulatory Visit (INDEPENDENT_AMBULATORY_CARE_PROVIDER_SITE_OTHER): Payer: Medicare Other | Admitting: Gastroenterology

## 2012-02-24 DIAGNOSIS — E538 Deficiency of other specified B group vitamins: Secondary | ICD-10-CM

## 2012-02-28 ENCOUNTER — Other Ambulatory Visit: Payer: Self-pay | Admitting: Gastroenterology

## 2012-02-28 MED ORDER — MESALAMINE ER 250 MG PO CPCR: ORAL_CAPSULE | ORAL | Status: DC

## 2012-02-28 NOTE — Telephone Encounter (Signed)
rx sent, pt aware.

## 2012-03-23 ENCOUNTER — Ambulatory Visit (INDEPENDENT_AMBULATORY_CARE_PROVIDER_SITE_OTHER): Payer: Medicare Other | Admitting: Gastroenterology

## 2012-03-23 DIAGNOSIS — E538 Deficiency of other specified B group vitamins: Secondary | ICD-10-CM

## 2012-03-29 ENCOUNTER — Telehealth: Payer: Self-pay | Admitting: Gastroenterology

## 2012-03-29 NOTE — Telephone Encounter (Signed)
Pt c/o worsening reflux no matter what she eats. She remains on Pantoprazole. Pt given an appt with Dr Sharlett Iles on 03/31/12 at 08:45am; pt stated understanding.

## 2012-03-29 NOTE — Telephone Encounter (Signed)
lmom for pt to call back. Hx of UC, GERD on Pentasa and Prevacid. Last OV 10/26/11.

## 2012-03-30 ENCOUNTER — Encounter: Payer: Self-pay | Admitting: *Deleted

## 2012-03-31 ENCOUNTER — Ambulatory Visit (INDEPENDENT_AMBULATORY_CARE_PROVIDER_SITE_OTHER): Payer: Medicare Other | Admitting: Gastroenterology

## 2012-03-31 ENCOUNTER — Encounter: Payer: Self-pay | Admitting: Gastroenterology

## 2012-03-31 DIAGNOSIS — R109 Unspecified abdominal pain: Secondary | ICD-10-CM

## 2012-03-31 DIAGNOSIS — K5190 Ulcerative colitis, unspecified, without complications: Secondary | ICD-10-CM

## 2012-03-31 DIAGNOSIS — K219 Gastro-esophageal reflux disease without esophagitis: Secondary | ICD-10-CM

## 2012-03-31 DIAGNOSIS — K3184 Gastroparesis: Secondary | ICD-10-CM

## 2012-03-31 MED ORDER — AMBULATORY NON FORMULARY MEDICATION: Status: DC

## 2012-03-31 MED ORDER — SUCRALFATE 1 GM/10ML PO SUSP: ORAL | Status: DC

## 2012-03-31 NOTE — Patient Instructions (Signed)
Your abdominal ultrasound is scheduled for 04/05/2012 please arrive at 8:15am [facility], make sure you have nothing to eat or drink after midnight.  Take Carafate 2 tsp after meals and at bedtime, [facility]. Take Domperidone before meals and at bedtime, rx sent to [facility] [phone]. Make an office visit to follow up with Dr Sharlett Iles in 1 month.

## 2012-03-31 NOTE — Progress Notes (Signed)
This is a very pleasant 77 year old Caucasian female who has mild ulcerative colitis in remission on Pentasa therapy. She also has chronic acid reflux with documented gastroparesis, but for some reason is off of her domperidone. One month ago she had nausea and vomiting and diarrhea for 24 hours, and has had resultant burning substernal chest pain and continue regurgitation without dysphagia or any other hepatobiliary complaints. She denies right upper quadrant pain or lower gastrointestinal problems. Her appetite is good her weight is stable. Her medications include daily Protonix 40 mg and Pentasa 3 capsules once a day. Endoscopy was performed several years ago and was otherwise fairly unremarkable except for some gastric polyposis. Biopsies for H. pylori were negative. She did have an esophageal stricture that was dilated.  Current Medications, Allergies, Past Medical History, Past Surgical History, Family History and Social History were reviewed in [employer] record.  Pertinent Review of Systems Negative... no history of Raynaud's phenomenum or collagen vascular disease. She is on medications for well-controlled hypertension and hyperlipidemia.   Physical Exam: Healthy-appearing patient in no distress. Low pressure 132/60, pulse 60 and regular, and weight 131 pounds. I cannot appreciate stigmata of chronic liver disease. Chest is clear and she is a regular rhythm without murmurs gallops or rubs. I cannot appreciate hepatosplenomegaly, abdominal masses, tenderness, or significant succussion splash. Peripheral extremities are unremarkable and mental status is normal.    Assessment and Plan: Chronic GERD with recent exacerbation by probable Norovirus infection. She does have a history of gastroparesis which also is probably exacerbated. I have restarted domperidone 10 mg 3 times a day before meals with bedtime dose if needed. Also have placed her on liquid Carafate PC and at  bedtime to be tapered as tolerated, and have changed her to Dexilant 60 mg every morning. Upper abdominal ultrasound exam order to exclude cholelithiasis. She otherwise is to continue her medications as listed and reviewed. She may need followup endoscopy depending on her clinical course. She does have rather severe B12 deficiency, and is on parenteral replacement therapy at this time. Her ulcerative colitis is in remission and we will continue amino salicylates at current doses . Encounter Diagnoses  Name Primary?  . Abdominal pain Yes  . Gastroparesis

## 2012-04-05 ENCOUNTER — Telehealth: Payer: Self-pay | Admitting: Gastroenterology

## 2012-04-05 ENCOUNTER — Ambulatory Visit (HOSPITAL_COMMUNITY)
Admission: RE | Admit: 2012-04-05 | Discharge: 2012-04-05 | Disposition: A | Discharge status: Home / Self Care | Payer: Medicare Other | Source: Ambulatory Visit | Attending: Gastroenterology | Admitting: Gastroenterology

## 2012-04-05 DIAGNOSIS — K3184 Gastroparesis: Secondary | ICD-10-CM

## 2012-04-05 DIAGNOSIS — R109 Unspecified abdominal pain: Secondary | ICD-10-CM | POA: Insufficient documentation

## 2012-04-05 NOTE — Telephone Encounter (Signed)
Number busy

## 2012-04-05 NOTE — Telephone Encounter (Signed)
rx already sent

## 2012-04-25 ENCOUNTER — Ambulatory Visit (INDEPENDENT_AMBULATORY_CARE_PROVIDER_SITE_OTHER): Payer: Medicare Other | Admitting: Gastroenterology

## 2012-04-25 ENCOUNTER — Other Ambulatory Visit: Payer: Self-pay | Admitting: Physician Assistant

## 2012-04-25 DIAGNOSIS — H9319 Tinnitus, unspecified ear: Secondary | ICD-10-CM

## 2012-04-25 DIAGNOSIS — E538 Deficiency of other specified B group vitamins: Secondary | ICD-10-CM

## 2012-04-28 ENCOUNTER — Ambulatory Visit
Admission: RE | Admit: 2012-04-28 | Discharge: 2012-04-28 | Disposition: A | Discharge status: Home / Self Care | Payer: Medicare Other | Source: Ambulatory Visit | Attending: Physician Assistant | Admitting: Physician Assistant

## 2012-04-28 DIAGNOSIS — H9319 Tinnitus, unspecified ear: Secondary | ICD-10-CM

## 2012-04-28 MED ORDER — GADOBENATE DIMEGLUMINE 529 MG/ML IV SOLN
12.0000 mL | Freq: Once | INTRAVENOUS | Status: AC | PRN
  Administered 2012-04-28: 12 mL via INTRAVENOUS

## 2012-05-01 ENCOUNTER — Other Ambulatory Visit: Payer: Self-pay | Admitting: Otolaryngology

## 2012-05-01 DIAGNOSIS — I729 Aneurysm of unspecified site: Secondary | ICD-10-CM

## 2012-05-02 ENCOUNTER — Ambulatory Visit (INDEPENDENT_AMBULATORY_CARE_PROVIDER_SITE_OTHER): Payer: Medicare Other | Admitting: Gastroenterology

## 2012-05-02 ENCOUNTER — Encounter: Payer: Self-pay | Admitting: Gastroenterology

## 2012-05-02 VITALS — BP 132/60 | HR 60 | Ht 62.0 in | Wt 131.0 lb

## 2012-05-02 DIAGNOSIS — K219 Gastro-esophageal reflux disease without esophagitis: Secondary | ICD-10-CM

## 2012-05-02 DIAGNOSIS — E538 Deficiency of other specified B group vitamins: Secondary | ICD-10-CM

## 2012-05-02 DIAGNOSIS — K5190 Ulcerative colitis, unspecified, without complications: Secondary | ICD-10-CM

## 2012-05-02 MED ORDER — DEXLANSOPRAZOLE 60 MG PO CPDR: 60.0000 mg | DELAYED_RELEASE_CAPSULE | Freq: Every day | ORAL | Status: DC

## 2012-05-02 NOTE — Progress Notes (Signed)
This is a 77 year old Caucasian female with chronic GERD who continues with reflux symptoms mostly consisting of excessive mucous in her throat despite daily Protonix. There is no history of dysphagia or other gastrointestinal symptoms. She has chronic ulcerative colitis and is on daily by mouth aminosalicylate. Last colonoscopy a year and a half ago showed multiple pseudopolyps but no dysplasia. She is having regular bowel movements without melena or hematochezia. Her appetite is good her weight is stable. She had profound B12 deficiency and has been on parenteral replacement therapy.  Current Medications, Allergies, Past Medical History, Past Surgical History, Family History and Social History were reviewed in [employer] record.  Pertinent Review of Systems Negative   Physical Exam: Vital signs all normal. Abdominal exam shows no organomegaly, masses or tenderness. Bowel sounds are normal. Chest is clear and she is a regular rhythm without murmurs gallops or rubs. I cannot appreciate stigmata of chronic liver disease.    Assessment and Plan: I am not sure that her current symptoms are reflux related or from postnasal drip associated with chronic allergic URI symptoms. I will try her on Dexilant 60 mg a day for 2 weeks to see if this makes a difference while continuing a standard antireflux regime. I have urged her to continue her oral amino salicylates, and she needs every 3 year dysplasia screening colonoscopy. I have decreased her Carafate when necessary dosage. She is to continue her monthly B12 parenteral replacement therapy. We may need to consider eosinophilic esophagitis if her symptoms worsen. However, she is hasn't undergo repeat endoscopic exam at this time. She has no history of specific food allergies. No diagnosis found.

## 2012-05-02 NOTE — Patient Instructions (Addendum)
We have given you samples of the following medication to take: Dexilant (in place of pantoprazole) Please call us back in 2 weeks to let us know how the [place] works for you at [phone]. Ask to speak to [facility]. CC:Dr [name]

## 2012-05-04 ENCOUNTER — Ambulatory Visit
Admission: RE | Admit: 2012-05-04 | Discharge: 2012-05-04 | Disposition: A | Discharge status: Home / Self Care | Payer: Medicare Other | Source: Ambulatory Visit | Attending: Otolaryngology | Admitting: Otolaryngology

## 2012-05-04 DIAGNOSIS — I729 Aneurysm of unspecified site: Secondary | ICD-10-CM

## 2012-05-05 ENCOUNTER — Other Ambulatory Visit: Payer: Medicare Other

## 2012-05-16 ENCOUNTER — Other Ambulatory Visit: Payer: Medicare Other

## 2012-05-25 ENCOUNTER — Ambulatory Visit (INDEPENDENT_AMBULATORY_CARE_PROVIDER_SITE_OTHER): Payer: Medicare Other | Admitting: Gastroenterology

## 2012-05-25 DIAGNOSIS — E538 Deficiency of other specified B group vitamins: Secondary | ICD-10-CM

## 2012-06-26 ENCOUNTER — Ambulatory Visit (INDEPENDENT_AMBULATORY_CARE_PROVIDER_SITE_OTHER): Payer: Medicare Other | Admitting: Gastroenterology

## 2012-06-26 DIAGNOSIS — E538 Deficiency of other specified B group vitamins: Secondary | ICD-10-CM

## 2012-06-26 MED ORDER — CYANOCOBALAMIN 1000 MCG/ML IJ SOLN
1000.0000 ug | Freq: Once | INTRAMUSCULAR | Status: AC
  Administered 2012-06-26: 1000 ug via INTRAMUSCULAR

## 2012-07-27 ENCOUNTER — Ambulatory Visit (INDEPENDENT_AMBULATORY_CARE_PROVIDER_SITE_OTHER): Payer: Medicare Other | Admitting: Gastroenterology

## 2012-07-27 DIAGNOSIS — E538 Deficiency of other specified B group vitamins: Secondary | ICD-10-CM

## 2012-07-27 MED ORDER — CYANOCOBALAMIN 1000 MCG/ML IJ SOLN
1000.0000 ug | Freq: Once | INTRAMUSCULAR | Status: AC
  Administered 2012-07-27: 1000 ug via INTRAMUSCULAR

## 2012-08-28 ENCOUNTER — Ambulatory Visit (INDEPENDENT_AMBULATORY_CARE_PROVIDER_SITE_OTHER): Payer: Medicare Other | Admitting: Gastroenterology

## 2012-08-28 DIAGNOSIS — E538 Deficiency of other specified B group vitamins: Secondary | ICD-10-CM

## 2012-08-28 MED ORDER — CYANOCOBALAMIN 1000 MCG/ML IJ SOLN
1000.0000 ug | Freq: Once | INTRAMUSCULAR | Status: AC
  Administered 2012-08-28: 1000 ug via INTRAMUSCULAR

## 2012-09-28 ENCOUNTER — Ambulatory Visit (INDEPENDENT_AMBULATORY_CARE_PROVIDER_SITE_OTHER): Payer: Medicare Other | Admitting: Gastroenterology

## 2012-09-28 DIAGNOSIS — E538 Deficiency of other specified B group vitamins: Secondary | ICD-10-CM

## 2012-09-28 MED ORDER — CYANOCOBALAMIN 1000 MCG/ML IJ SOLN
1000.0000 ug | INTRAMUSCULAR | Status: AC
  Administered 2012-09-28 – 2013-01-01 (×2): 1000 ug via INTRAMUSCULAR

## 2012-10-30 ENCOUNTER — Ambulatory Visit (INDEPENDENT_AMBULATORY_CARE_PROVIDER_SITE_OTHER): Payer: Medicare Other | Admitting: Gastroenterology

## 2012-10-30 DIAGNOSIS — E538 Deficiency of other specified B group vitamins: Secondary | ICD-10-CM

## 2012-10-30 MED ORDER — CYANOCOBALAMIN 1000 MCG/ML IJ SOLN
1000.0000 ug | Freq: Once | INTRAMUSCULAR | Status: AC
  Administered 2012-10-30: 1000 ug via INTRAMUSCULAR

## 2012-11-30 ENCOUNTER — Ambulatory Visit (INDEPENDENT_AMBULATORY_CARE_PROVIDER_SITE_OTHER): Payer: Medicare Other | Admitting: Gastroenterology

## 2012-11-30 DIAGNOSIS — E538 Deficiency of other specified B group vitamins: Secondary | ICD-10-CM

## 2012-11-30 MED ORDER — CYANOCOBALAMIN 1000 MCG/ML IJ SOLN
1000.0000 ug | Freq: Once | INTRAMUSCULAR | Status: AC
  Administered 2012-11-30: 1000 ug via INTRAMUSCULAR

## 2012-12-29 ENCOUNTER — Other Ambulatory Visit: Payer: Self-pay | Admitting: *Deleted

## 2012-12-29 MED ORDER — PANTOPRAZOLE SODIUM 40 MG PO TBEC: 40.0000 mg | DELAYED_RELEASE_TABLET | Freq: Every day | ORAL | Status: DC

## 2013-01-01 ENCOUNTER — Ambulatory Visit (INDEPENDENT_AMBULATORY_CARE_PROVIDER_SITE_OTHER): Payer: Medicare Other | Admitting: Gastroenterology

## 2013-01-01 DIAGNOSIS — E538 Deficiency of other specified B group vitamins: Secondary | ICD-10-CM

## 2013-01-03 ENCOUNTER — Encounter: Payer: Self-pay | Admitting: *Deleted

## 2013-01-31 ENCOUNTER — Telehealth: Payer: Self-pay | Admitting: *Deleted

## 2013-01-31 MED ORDER — MESALAMINE ER 250 MG PO CPCR: ORAL_CAPSULE | ORAL | Status: DC

## 2013-01-31 NOTE — Telephone Encounter (Signed)
PATIENT NOTIFIED MEDICATION WAS SENT

## 2013-02-01 NOTE — Telephone Encounter (Signed)
Opened in error

## 2013-09-12 ENCOUNTER — Ambulatory Visit
Admission: RE | Admit: 2013-09-12 | Discharge: 2013-09-12 | Disposition: A | Discharge status: Home / Self Care | Payer: Medicare Other | Source: Ambulatory Visit | Attending: Family Medicine | Admitting: Family Medicine

## 2013-09-12 ENCOUNTER — Other Ambulatory Visit: Payer: Self-pay | Admitting: Family Medicine

## 2013-09-12 DIAGNOSIS — M2550 Pain in unspecified joint: Secondary | ICD-10-CM

## 2013-12-03 ENCOUNTER — Telehealth: Payer: Self-pay | Admitting: Gastroenterology

## 2013-12-03 ENCOUNTER — Telehealth: Payer: Self-pay | Admitting: *Deleted

## 2013-12-03 MED ORDER — MESALAMINE ER 250 MG PO CPCR: ORAL_CAPSULE | ORAL | Status: DC

## 2013-12-03 NOTE — Telephone Encounter (Signed)
Patient called back, appointment has been made RX was sent, patient verbalized understanding to keep appointment for further refills

## 2013-12-03 NOTE — Telephone Encounter (Signed)
Left message for patient to call office back. Patient needs an office visit for further refills.

## 2013-12-18 ENCOUNTER — Ambulatory Visit (INDEPENDENT_AMBULATORY_CARE_PROVIDER_SITE_OTHER): Payer: Medicare Other | Admitting: Gastroenterology

## 2013-12-18 ENCOUNTER — Encounter: Payer: Self-pay | Admitting: Gastroenterology

## 2013-12-18 VITALS — BP 120/70 | HR 88 | Ht 62.0 in | Wt 125.6 lb

## 2013-12-18 DIAGNOSIS — K219 Gastro-esophageal reflux disease without esophagitis: Secondary | ICD-10-CM

## 2013-12-18 DIAGNOSIS — E538 Deficiency of other specified B group vitamins: Secondary | ICD-10-CM

## 2013-12-18 DIAGNOSIS — K5190 Ulcerative colitis, unspecified, without complications: Secondary | ICD-10-CM

## 2013-12-18 MED ORDER — MESALAMINE ER 250 MG PO CPCR: ORAL_CAPSULE | ORAL | Status: DC

## 2013-12-18 MED ORDER — PANTOPRAZOLE SODIUM 40 MG PO TBEC: 40.0000 mg | DELAYED_RELEASE_TABLET | Freq: Every day | ORAL | Status: DC

## 2013-12-18 NOTE — Patient Instructions (Addendum)
We have sent the following medications to your pharmacy for you to pick up at your convenience: Pentasa  Protonix  You will be due for a recall colonoscopy in May 2015 with Dr Hilarie Fredrickson. We will send you a reminder in the mail when it gets closer to that time.

## 2013-12-18 NOTE — Progress Notes (Signed)
This is a 79 year old Caucasian female who has had ulcerative colitis for over 20 years.  She currently is in remission on mesalamine 250 mg CR 3 capsules daily.  She denies any gastrointestinal complaints at this time is having regular bowel movements without melena or hematochezia.  She also denies lower abdominal pain, upper GI or hepatobiliary complaints.  She is on Dexilant 60 mg a day for chronic GERD which seems to be controlling her symptoms.  She is followed by Dr. Brigitte Pulse in primary care.  Last colonoscopy was in November of 2011.  Multiple pseudopolyps and chronic scarring of the colon was noted the time of that exam.  There's been no previous evidence of dysplasia on pathology exam of multiple biopsies..  Her appetite is good, and her weight is stable.  Current Medications, Allergies, Past Medical History, Past Surgical History, Family History and Social History were reviewed in [employer] record.  ROS: All systems were reviewed and are negative unless otherwise stated in the HPI.          Physical Exam: Blood pressure 120/70, pulse 88 and regular, and weight 125 with a BMI of 22.97.  She is a former smoker.  I cannot appreciate stigmata of chronic liver disease.  Chest is clear she appear to be in a regular rhythm without murmurs gallops or rubs.  There is no hepatosplenomegaly, abdominal masses, or tenderness.  Bowel sounds are normal.  Mental status is normal.    Assessment and Plan: We will continue her daily Dexilant and I have reviewed and have reflux maneuvers with the patient.  She seems to be in remission terms of her inflammatory bowel disease, but she is due for dysplasia screening which I have scheduled at her convenience.  I will turn care over to Dr. Hilarie Fredrickson upon my retirement.  She has a labs done by primary care at yearly, and these apparently have been normal.  She is to continue other medications as listed and reviewed.  I've advised to continue  monthly B12 injections because of her severely low serum level II years ago of 96.  CC Dr Lutricia Feil At [facility]

## 2014-03-21 ENCOUNTER — Encounter: Payer: Self-pay | Admitting: Internal Medicine

## 2014-03-25 ENCOUNTER — Ambulatory Visit (INDEPENDENT_AMBULATORY_CARE_PROVIDER_SITE_OTHER): Payer: Medicare Other | Admitting: Internal Medicine

## 2014-03-25 ENCOUNTER — Encounter: Payer: Self-pay | Admitting: Internal Medicine

## 2014-03-25 ENCOUNTER — Ambulatory Visit: Payer: Medicare Other | Admitting: Internal Medicine

## 2014-03-25 VITALS — BP 116/72 | HR 76 | Ht 62.0 in | Wt 126.4 lb

## 2014-03-25 DIAGNOSIS — IMO0001 Reserved for inherently not codable concepts without codable children: Secondary | ICD-10-CM

## 2014-03-25 DIAGNOSIS — K5190 Ulcerative colitis, unspecified, without complications: Secondary | ICD-10-CM

## 2014-03-25 DIAGNOSIS — Z1211 Encounter for screening for malignant neoplasm of colon: Secondary | ICD-10-CM

## 2014-03-25 DIAGNOSIS — K219 Gastro-esophageal reflux disease without esophagitis: Secondary | ICD-10-CM

## 2014-03-25 DIAGNOSIS — R1110 Vomiting, unspecified: Secondary | ICD-10-CM

## 2014-03-25 MED ORDER — MESALAMINE ER 250 MG PO CPCR: ORAL_CAPSULE | ORAL | Status: DC

## 2014-03-25 MED ORDER — PANTOPRAZOLE SODIUM 40 MG PO TBEC: 40.0000 mg | DELAYED_RELEASE_TABLET | Freq: Every day | ORAL | Status: DC

## 2014-03-25 MED ORDER — MOVIPREP 100 G PO SOLR: ORAL | Status: DC

## 2014-03-25 NOTE — Progress Notes (Signed)
Patient ID: Erica Carter, female   DOB: 1935-10-26, 79 y.o.   MRN: [id number] HPI: Erica Carter is a 79 yo female with PMH of long-standing ulcerative colitis diagnosed greater than 20 years ago, GERD, Schatzki's ring, and gastric polyps who is seen in followup. She has been followed and was last seen earlier this year by Dr. Sharlett Iles prior to his retirement.  She reports today she is feeling well. Her colitis continues to be in remission. She reports her bowel movements are regular without blood or melena. She denies diarrhea. She is taking an over-the-counter probiotic which is helped with regularity significantly. She denies abdominal pain. She is using B12 injections monthly for long-standing B12 deficiency. Reports good appetite but is getting over a cold which has affected her smell and taste. She reports regurgitation of food and fluid but no heartburn. She also denies dysphagia or odynophagia. No nausea or vomiting. No early satiety. She has taken pantoprazole 40 mg daily. She was given a sample of Dexilant in the past but is not taking this. She's also had Carafate on her med list but stopped it as she didn't find any improvement. She reports a stable weight recently. She denies hepatobiliary complaint. No fevers or chills. No new rashes, joint pains or mouth sores.  She is taking Pentasa 750 mg once daily  Past Medical History  Diagnosis Date  . Other and unspecified hyperlipidemia   . Unspecified essential hypertension   . Esophageal reflux   . Stricture and stenosis of esophagus   . Hiatal hernia   . Ulcerative colitis, unspecified   . Diverticulosis of colon (without mention of hemorrhage)   . Acute gastritis without mention of hemorrhage   . Personal history of colonic polyps 09/11/2010    hyperplasic    Past Surgical History  Procedure Laterality Date  . Nasal sinus surgery    . Tubal ligation    . Cataract extraction    . Thumb arthroscopy      Current Outpatient  Prescriptions  Medication Sig Dispense Refill  . AMBULATORY NON FORMULARY MEDICATION Domperidone 32m take one tablet by mouth three times a day before meals and at bedtime  240 tablet  3  . amLODipine (NORVASC) 5 MG tablet Take 5 mg by mouth daily.        .Marland Kitchenaspirin 81 MG tablet Take 81 mg by mouth daily.      .Marland Kitchenatorvastatin (LIPITOR) 20 MG tablet Take 20 mg by mouth daily.       . calcium gluconate 500 MG tablet Take 500 mg by mouth daily.        . cholecalciferol (VITAMIN D) 1000 UNITS tablet Take 1,000 Units by mouth daily.        .Marland Kitchenlosartan (COZAAR) 100 MG tablet Take 100 mg by mouth daily.        . mesalamine (PENTASA) 250 MG CR capsule Take three capsules by mouth once a day  270 capsule  4  . Misc Natural Products (COLON CARE PO) Take 1 tablet by mouth daily.        . Multiple Vitamins-Minerals (CENTRUM SILVER PO) Take 1 tablet by mouth daily.        . pantoprazole (PROTONIX) 40 MG tablet Take 1 tablet (40 mg total) by mouth daily.  90 tablet  6  . sucralfate (CARAFATE) 1 GM/10ML suspension Take 2 tsp after meals and at bedtime  3600 mL  1  . vitamin C (ASCORBIC ACID) 500 MG tablet Take  500 mg by mouth daily.        . zoledronic acid (RECLAST) 5 MG/100ML SOLN Inject 5 mg into the vein once.        Marland Kitchen dexlansoprazole (DEXILANT) 60 MG capsule Take 1 capsule (60 mg total) by mouth daily.  20 capsule  0  . MOVIPREP 100 G SOLR Use per prep instruction  1 kit  0   No current facility-administered medications for this visit.    Allergies  Allergen Reactions  . Codeine     REACTION: Mouth swells    Family History  Problem Relation Age of Onset  . Colon cancer Neg Hx   . Thyroid cancer Brother     mets to brain  . Diabetes Mother   . Diabetes Sister     History  Substance Use Topics  . Smoking status: Former Research scientist (life sciences)  . Smokeless tobacco: Never Used  . Alcohol Use: No    ROS: As per history of present illness, otherwise negative  BP 116/72  Pulse 76  Ht _0  (1.575 m)   Wt 126 lb 6.4 oz (57.335 kg)  BMI 23.11 kg/m2 Constitutional: Well-developed and well-nourished. No distress. HEENT: Normocephalic and atraumatic. Oropharynx is clear and moist. No oropharyngeal exudate. Conjunctivae are normal.  No scleral icterus. Neck: Neck supple. Trachea midline. Cardiovascular: Normal rate, regular rhythm and intact distal pulses. No M/R/G Pulmonary/chest: Effort normal and breath sounds normal. No wheezing, rales or rhonchi. Abdominal: Soft, nontender, nondistended. Bowel sounds active throughout. There are no masses palpable. No hepatosplenomegaly. Extremities: no clubbing, cyanosis, or edema Lymphadenopathy: No cervical adenopathy noted. Neurological: Alert and oriented to person place and time. Skin: Skin is warm and dry. No rashes noted. Psychiatric: Normal mood and affect. Behavior is normal.  Colonoscopy -- 2011 -- quiescent colitis no dysplasia, fragments of inflammatory pseudopolyp. repeat colonoscopy recommended 2 years EGD  -- November 2011 -- sessile antral polyp which appeared inflammatory, normal duodenal folds, normal esophagus. 3 cm hiatal hernia. Pathology result = hyperplastic polyp, negative for H. pylori. Negative for dysplasia.   ASSESSMENT/PLAN: 79 yo female with PMH of long-standing ulcerative colitis diagnosed greater than 20 years ago, GERD, Schatzki's ring, and gastric polyps who is seen in followup.  1.  UC, long-standing -- her ulcerative colitis has been in clinical remission and biopsies showed quiescent colitis in 2011. She has been on Pentasa 750 mg daily. We will continue this medication and dose for now. I have recommended surveillance colonoscopy with IBD surveillance biopsies given her long-standing colitis. We discussed the test including risks and benefits and she is agreeable to proceed  2.  GERD/regurgitation -- she is not having heartburn nor any alarm symptoms in the upper GI tract. It seems that her PPI is working well but she is  having regurgitation. We discussed how PPI stops acid reflux but not necessarily regurgitation. She does have a hiatal hernia which also makes regurgitation easier. I have advised her to avoid eating or drinking within 90 minutes of lying down, proximal head of her bed, and follow GERD diet.  There is no pharmacologic therapy or endoscopic therapy to prevent regurgitation. We did discuss Nissen, but she wishes to defer this due to her age, which I think is very reasonable.  She will continue pantoprazole 40 mg daily. If her symptoms worsen as that she notify me if she voices understanding

## 2014-03-25 NOTE — Patient Instructions (Signed)
You have been scheduled for a colonoscopy with propofol. Please follow written instructions given to you at your visit today.  Please pick up your prep kit at the pharmacy within the next 1-3 days. If you use inhalers (even only as needed), please bring them with you on the day of your procedure. Your physician has requested that you go to www.startemmi.com and enter the access code given to you at your visit today. This web site gives a general overview about your procedure. However, you should still follow specific instructions given to you by our office regarding your preparation for the procedure.  We have sent the following medications to your pharmacy for you to pick up at your convenience: Protonix, Pentasa  Diet for Gastroesophageal Reflux Disease, Adult Reflux (acid reflux) is when acid from your stomach flows up into the esophagus. When acid comes in contact with the esophagus, the acid causes irritation and soreness (inflammation) in the esophagus. When reflux happens often or so severely that it causes damage to the esophagus, it is called gastroesophageal reflux disease (GERD). Nutrition therapy can help ease the discomfort of GERD. FOODS OR DRINKS TO AVOID OR LIMIT  Smoking or chewing tobacco. Nicotine is one of the most potent stimulants to acid production in the gastrointestinal tract.  Caffeinated and decaffeinated coffee and black tea.  Regular or low-calorie carbonated beverages or energy drinks (caffeine-free carbonated beverages are allowed).   Strong spices, such as black pepper, white pepper, red pepper, cayenne, curry powder, and chili powder.  Peppermint or spearmint.  Chocolate.  High-fat foods, including meats and fried foods. Extra added fats including oils, butter, salad dressings, and nuts. Limit these to less than 8 tsp per day.  Fruits and vegetables if they are not tolerated, such as citrus fruits or tomatoes.  Alcohol.  Any food that seems to aggravate  your condition. If you have questions regarding your diet, call your caregiver or a registered dietitian. OTHER THINGS THAT MAY HELP GERD INCLUDE:   Eating your meals slowly, in a relaxed setting.  Eating 5 to 6 small meals per day instead of 3 large meals.  Eliminating food for a period of time if it causes distress.  Not lying down until 3 hours after eating a meal.  Keeping the head of your bed raised 6 to 9 inches (15 to 23 cm) by using a foam wedge or blocks under the legs of the bed. Lying flat may make symptoms worse.  Being physically active. Weight loss may be helpful in reducing reflux in overweight or obese adults.  Wear loose fitting clothing EXAMPLE MEAL PLAN This meal plan is approximately 2,000 calories based on [employer] meal planning guidelines. Breakfast   cup cooked oatmeal.  1 cup strawberries.  1 cup low-fat milk.  1 oz almonds. Snack  1 cup cucumber slices.  6 oz yogurt (made from low-fat or fat-free milk). Lunch  2 slice whole-wheat bread.  2 oz sliced [place].  2 tsp mayonnaise.  1 cup blueberries.  1 cup snap peas. Snack  6 whole-wheat crackers.  1 oz string cheese. Dinner   cup brown rice.  1 cup mixed veggies.  1 tsp olive oil.  3 oz grilled fish. Document Released: 10/25/2005 Document Revised: 01/17/2012 Document Reviewed: 09/10/2011 [facility] Patient Information 2014 [place], [place].

## 2014-03-29 ENCOUNTER — Other Ambulatory Visit: Payer: Self-pay | Admitting: Internal Medicine

## 2014-03-29 ENCOUNTER — Encounter: Payer: Self-pay | Admitting: Internal Medicine

## 2014-03-29 ENCOUNTER — Ambulatory Visit (AMBULATORY_SURGERY_CENTER): Payer: Medicare Other | Admitting: Internal Medicine

## 2014-03-29 VITALS — BP 147/83 | HR 66 | Temp 97.6°F | Resp 14 | Ht 62.0 in | Wt 126.0 lb

## 2014-03-29 DIAGNOSIS — K5190 Ulcerative colitis, unspecified, without complications: Secondary | ICD-10-CM

## 2014-03-29 DIAGNOSIS — D126 Benign neoplasm of colon, unspecified: Secondary | ICD-10-CM

## 2014-03-29 DIAGNOSIS — K6389 Other specified diseases of intestine: Secondary | ICD-10-CM

## 2014-03-29 MED ORDER — SODIUM CHLORIDE 0.9 % IV SOLN: 500.0000 mL | INTRAVENOUS | Status: DC

## 2014-03-29 NOTE — Progress Notes (Signed)
Procedure ends, to recovery, report given and VSS. 

## 2014-03-29 NOTE — Op Note (Signed)
[facility]  [address] [place], [address]   COLONOSCOPY PROCEDURE REPORT  PATIENT: Erica Carter, Erica Carter  MR#: [id number] BIRTHDATE: September 14, 1935 , 79  yrs. old GENDER: Female ENDOSCOPIST: Jerene Bears, MD PROCEDURE DATE:  03/29/2014 PROCEDURE:   Colonoscopy with biopsy, Colonoscopy with cold biopsy polypectomy, and Colonoscopy with snare polypectomy First Screening Colonoscopy - Avg.  risk and is 50 yrs.  old or older - No.  Prior Negative Screening - Now for repeat screening. N/A  History of Adenoma - Now for follow-up colonoscopy & has been > or = to 3 yrs.  N/A  Polyps Removed Today? Yes. ASA CLASS:   Class III INDICATIONS:elevated risk screening and High risk patient with previously diagnosed UC pancolitis 8+ years, last colonoscopy Nov 2011 (quiescent colitis). MEDICATIONS: MAC sedation, administered by CRNA and propofol (Diprivan) 3579m IV  DESCRIPTION OF PROCEDURE:   After the risks benefits and alternatives of the procedure were thoroughly explained, informed consent was obtained.  A digital rectal exam revealed no rectal mass.   The LB [id number] endoscope was introduced through the anus and advanced to the cecum, which was identified by both the appendix and ileocecal valve. No adverse events experienced. The quality of the prep was Moviprep fair only after copious irrigation and lavage. The instrument was then slowly withdrawn as the colon was fully examined.    COLON FINDINGS: Moderate melanosis was found throughout the entire examined colon.   Six sessile polyps ranging between 3-742min size were found at the cecum (1), in the ascending colon (1), descending colon (2), and sigmoid colon (2).  Polypectomy was performed with cold forceps (2) and using cold snare (4).  All resections were complete and all polyp tissue was completely retrieved.   Mild diverticulosis was noted in the descending colon and sigmoid colon. There was no evidence of  active colitis in any segment of colon. Surveillance biopsies were performed in 4-quadrant fashion every 10 cm and placed into right and left colon jars.  There was persistent oozing from a single biopsy site in the descending colon and thus 1 hemostatic clip was placed with complete cessation of bleeding. Retroflexed views revealed no abnormalities. The time to cecum=9 minutes 17 seconds.  Withdrawal time=22 minutes 22 seconds.  The scope was withdrawn and the procedure completed. COMPLICATIONS: There were no complications.  ENDOSCOPIC IMPRESSION: 1.   Moderate melanosis was found throughout the entire examined colon 2.   Six sessile polyps ranging between 3-79m95mn size were found at the cecum, in the ascending colon, descending colon, and sigmoid colon; Polypectomy was performed with cold forceps and using cold snare 3.   Mild diverticulosis was noted in the descending colon and sigmoid colon 4.   There was no evidence of active colitis.  Surveillance biopsies were performed in 4-quadrant fashion every 10 cm and placed into right and left colon jars.  Clip on site of persist oozing after biopsy with cold forceps (descending colon)  RECOMMENDATIONS: 1.  Await pathology results 2.  Continue current medications including Pentasa at current dose. 3.  Office visit in 3 months   eSigned:  JayJerene BearsD 03/29/2014 4:35 PM   cc: The Patient and KimMayra NeerD   PATIENT NAME:  VadFiana, Gladu#: [id number]

## 2014-03-29 NOTE — Patient Instructions (Signed)
YOU HAD AN ENDOSCOPIC PROCEDURE TODAY AT [facility]: Refer to the procedure report that was given to you for any specific questions about what was found during the examination.  If the procedure report does not answer your questions, please call your gastroenterologist to clarify.  If you requested that your care partner not be given the details of your procedure findings, then the procedure report has been included in a sealed envelope for you to review at your convenience later.  YOU SHOULD EXPECT: Some feelings of bloating in the abdomen. Passage of more gas than usual.  Walking can help get rid of the air that was put into your GI tract during the procedure and reduce the bloating. If you had a lower endoscopy (such as a colonoscopy or flexible sigmoidoscopy) you may notice spotting of blood in your stool or on the toilet paper. If you underwent a bowel prep for your procedure, then you may not have a normal bowel movement for a few days.  DIET: Your first meal following the procedure should be a light meal and then it is ok to progress to your normal diet.  A half-sandwich or bowl of soup is an example of a good first meal.  Heavy or fried foods are harder to digest and may make you feel nauseous or bloated.  Likewise meals heavy in dairy and vegetables can cause extra gas to form and this can also increase the bloating.  Drink plenty of fluids but you should avoid alcoholic beverages for 24 hours.  ACTIVITY: Your care partner should take you home directly after the procedure.  You should plan to take it easy, moving slowly for the rest of the day.  You can resume normal activity the day after the procedure however you should NOT DRIVE or use heavy machinery for 24 hours (because of the sedation medicines used during the test).    SYMPTOMS TO REPORT IMMEDIATELY: A gastroenterologist can be reached at any hour.  During normal business hours, 8:30 AM to 5:00 PM Monday through Friday,  call [phone].  After hours and on weekends, please call the GI answering service at [phone] who will take a message and have the physician on call contact you.   Following lower endoscopy (colonoscopy or flexible sigmoidoscopy):  Excessive amounts of blood in the stool  Significant tenderness or worsening of abdominal pains  Swelling of the abdomen that is new, acute  Fever of 100F or higher  FOLLOW UP: If any biopsies were taken you will be contacted by phone or by letter within the next 1-3 weeks.  Call your gastroenterologist if you have not heard about the biopsies in 3 weeks.  Our staff will call the home number listed on your records the next business day following your procedure to check on you and address any questions or concerns that you may have at that time regarding the information given to you following your procedure. This is a courtesy call and so if there is no answer at the home number and we have not heard from you through the emergency physician on call, we will assume that you have returned to your regular daily activities without incident.  SIGNATURES/CONFIDENTIALITY: You and/or your care partner have signed paperwork which will be entered into your electronic medical record.  These signatures attest to the fact that that the information above on your After Visit Summary has been reviewed and is understood.  Full responsibility of the confidentiality of this  discharge information lies with you and/or your care-partner.  Recommendations Await pathology results Continue current medications including Pentasa at current dose Office visit in 3 months

## 2014-03-29 NOTE — Progress Notes (Signed)
Called to room to assist during endoscopic procedure.  Patient ID and intended procedure confirmed with present staff. Received instructions for my participation in the procedure from the performing physician.  

## 2014-04-02 ENCOUNTER — Telehealth: Payer: Self-pay

## 2014-04-02 NOTE — Telephone Encounter (Signed)
  Follow up Call-  Call back number 03/29/2014  Post procedure Call Back phone  # [phone]  Permission to leave phone message Yes     Patient questions:  Do you have a fever, pain , or abdominal swelling? no Pain Score  0 *  Have you tolerated food without any problems? yes  Have you been able to return to your normal activities? yes  Do you have any questions about your discharge instructions: Diet   no Medications  no Follow up visit  no  Do you have questions or concerns about your Care? no  Actions: * If pain score is 4 or above: No action needed, pain <4.

## 2014-04-11 ENCOUNTER — Encounter: Payer: Self-pay | Admitting: Internal Medicine

## 2014-04-18 ENCOUNTER — Telehealth: Payer: Self-pay | Admitting: Internal Medicine

## 2014-04-18 NOTE — Telephone Encounter (Signed)
All questions answered about pathology report.  She will call back for any additional questions or concerns

## 2014-06-07 ENCOUNTER — Ambulatory Visit (INDEPENDENT_AMBULATORY_CARE_PROVIDER_SITE_OTHER): Payer: Medicare Other | Admitting: Family Medicine

## 2014-06-07 VITALS — BP 126/74 | HR 83 | Temp 98.1°F | Resp 16 | Ht 61.0 in | Wt 123.0 lb

## 2014-06-07 DIAGNOSIS — IMO0002 Reserved for concepts with insufficient information to code with codable children: Secondary | ICD-10-CM

## 2014-06-07 DIAGNOSIS — T148XXA Other injury of unspecified body region, initial encounter: Secondary | ICD-10-CM

## 2014-06-07 MED ORDER — MUPIROCIN 2 % EX OINT: TOPICAL_OINTMENT | CUTANEOUS | Status: DC

## 2014-06-07 NOTE — Progress Notes (Signed)
Chief Complaint:  Chief Complaint  Patient presents with  . Laceration    left leg, today    HPI: Erica Carter is a 79 y.o. female who is here for  Left leg laceration, she fell today about 2 pm and fell on [address] [place] and has a scrape that would not stop bleeding until she got here on her lower left leg. . She does not hurt, she has some swellign and numbness and tingling. No blood thinners UTD on her tetanus``  Past Medical History  Diagnosis Date  . Other and unspecified hyperlipidemia   . Unspecified essential hypertension   . Esophageal reflux   . Stricture and stenosis of esophagus   . Hiatal hernia   . Ulcerative colitis, unspecified   . Diverticulosis of colon (without mention of hemorrhage)   . Acute gastritis without mention of hemorrhage   . Personal history of colonic polyps 09/11/2010    hyperplasic   Past Surgical History  Procedure Laterality Date  . Nasal sinus surgery    . Tubal ligation    . Cataract extraction    . Thumb arthroscopy     History   Social History  . Marital Status: Married    Spouse Name: N/A    Number of Children: 2  . Years of Education: N/A   Social History Main Topics  . Smoking status: Former Research scientist (life sciences)  . Smokeless tobacco: Never Used  . Alcohol Use: No  . Drug Use: No  . Sexual Activity: None   Other Topics Concern  . None   Social History Narrative  . None   Family History  Problem Relation Age of Onset  . Colon cancer Neg Hx   . Thyroid cancer Brother     mets to brain  . Diabetes Mother   . Diabetes Sister    Allergies  Allergen Reactions  . Codeine     REACTION: Mouth swells   Prior to Admission medications   Medication Sig Start Date End Date Taking? Authorizing Provider  AMBULATORY NON FORMULARY MEDICATION Domperidone 66m take one tablet by mouth three times a day before meals and at bedtime 03/31/12   DSable Feil MD  amLODipine (NORVASC) 5 MG tablet Take 5 mg by mouth daily.       Historical Provider, MD  aspirin 81 MG tablet Take 81 mg by mouth daily.    Historical Provider, MD  atorvastatin (LIPITOR) 20 MG tablet Take 20 mg by mouth daily.     Historical Provider, MD  calcium gluconate 500 MG tablet Take 500 mg by mouth daily.      Historical Provider, MD  cholecalciferol (VITAMIN D) 1000 UNITS tablet Take 1,000 Units by mouth daily.      Historical Provider, MD  dexlansoprazole (DEXILANT) 60 MG capsule Take 1 capsule (60 mg total) by mouth daily. 05/02/12 06/01/12  DSable Feil MD  losartan (COZAAR) 100 MG tablet Take 100 mg by mouth daily.      Historical Provider, MD  mesalamine (PENTASA) 250 MG CR capsule Take three capsules by mouth once a day 03/25/14   JJerene Bears MD  Misc Natural Products (COLON CARE PO) Take 1 tablet by mouth daily.      Historical Provider, MD  Multiple Vitamins-Minerals (CENTRUM SILVER PO) Take 1 tablet by mouth daily.      Historical Provider, MD  pantoprazole (PROTONIX) 40 MG tablet Take 1 tablet (40 mg total) by mouth daily. 03/25/14  Jerene Bears, MD  vitamin C (ASCORBIC ACID) 500 MG tablet Take 500 mg by mouth daily.      Historical Provider, MD     ROS: The patient denies fevers, chills, night sweats, unintentional weight loss, chest pain, palpitations, wheezing, dyspnea on exertion, nausea, vomiting, abdominal pain, dysuria, hematuria, melena, numbness, weakness, or tingling.  All other systems have been reviewed and were otherwise negative with the exception of those mentioned in the HPI and as above.    PHYSICAL EXAM: Filed Vitals:   06/07/14 1753  BP: 126/74  Pulse: 83  Temp: 98.1 F (36.7 C)  Resp: 16   Filed Vitals:   06/07/14 1753  Height: 5' 1"  (1.549 m)  Weight: 123 lb (55.792 kg)   Body mass index is 23.25 kg/(m^2).  General: Alert, no acute distress HEENT:  Normocephalic, atraumatic, oropharynx patent. EOMI, PERRLA Cardiovascular:  Regular rate and rhythm, no rubs murmurs or gallops.  No Carotid bruits,  radial pulse intact. No pedal edema.  Respiratory: Clear to auscultation bilaterally.  No wheezes, rales, or rhonchi.  No cyanosis, no use of accessory musculature GI: No organomegaly, abdomen is soft and non-tender, positive bowel sounds.  No masses. Skin: + skin tear/abrasion  Neurologic: Facial musculature symmetric. Psychiatric: Patient is appropriate throughout our interaction. Lymphatic: No cervical lymphadenopathy Musculoskeletal: Gait intact. 5/5 strength, no e.o fracture, gait is normal Hip internal and external rotation normal Knee ROM nl   LABS: Results for orders placed in visit on 10/26/11  FERRITIN      Result Value Ref Range   Ferritin 22.3  10.0 - 291.0 ng/mL  FOLATE      Result Value Ref Range   Folate >24.8  >5.9 ng/mL  VITAMIN B12      Result Value Ref Range   Vitamin B-12 96 (*) 211 - 911 pg/mL  IBC PANEL      Result Value Ref Range   Iron 50  42 - 145 ug/dL   Transferrin 250.6  212.0 - 360.0 mg/dL   Saturation Ratios 14.3 (*) 20.0 - 50.0 %     EKG/XRAY:   Primary read interpreted by Dr. Marin Comment at [facility].   ASSESSMENT/PLAN: Encounter Diagnoses  Name Primary?  . Abrasion Yes  . Skin tear    Cleaned with surgical scrub, Cut torn skin that was potentially going to not be viable Apply bactroban, nonadhesive dressing, curlex Woudn care as directed Rx bactroban F/u prn, infection precautions given  Gross sideeffects, risk and benefits, and alternatives of medications d/w patient. Patient is aware that all medications have potential sideeffects and we are unable to predict every sideeffect or drug-drug interaction that may occur.  LE, [place], DO 06/09/2014 11:53 AM

## 2014-06-16 ENCOUNTER — Ambulatory Visit (INDEPENDENT_AMBULATORY_CARE_PROVIDER_SITE_OTHER): Payer: Medicare Other | Admitting: Emergency Medicine

## 2014-06-16 VITALS — BP 124/62 | HR 77 | Temp 98.1°F | Resp 20 | Ht 60.79 in | Wt 125.1 lb

## 2014-06-16 DIAGNOSIS — H1030 Unspecified acute conjunctivitis, unspecified eye: Secondary | ICD-10-CM

## 2014-06-16 MED ORDER — TOBRAMYCIN 0.3 % OP SOLN: 2.0000 [drp] | Freq: Four times a day (QID) | OPHTHALMIC | Status: DC

## 2014-06-16 NOTE — Progress Notes (Signed)
[facility] [address], [place] [address] [phone]  Date:  06/16/2014   Name:  Erica Carter   DOB:  [birth year]   MRN:  [id number]  PCP:  [name], MD    Chief Complaint: Conjunctivitis   History of Present Illness:  Erica Carter is a 79 y.o. very pleasant female patient who presents with the following:  Friday night developed a red right eye.  Watery drainage Saturday and Sunday glued shut.  No pain.  Some blurred vision.  No foreign body sensation.   No history of injury.  No contact lenses. No cough or coryza. No sick contacts. No improvement with over the counter medications or other home remedies.  Denies other complaint or health concern today.   Patient Active Problem List   Diagnosis Date Noted  . History of gastroesophageal reflux (GERD) 10/26/2011  . GASTRITIS, ACUTE W/O HEMORRHAGE 09/11/2010  . HYPERLIPIDEMIA 02/12/2008  . HYPERTENSION 02/12/2008  . GERD 02/12/2008  . 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 HIATAL HERNIA 05/17/2006  . COLITIS, ULCERATIVE 05/17/2006  . DIVERTICULOSIS, COLON 05/17/2006    Past Medical History  Diagnosis Date  . Other and unspecified hyperlipidemia   . Unspecified essential hypertension   . Esophageal reflux   . Stricture and stenosis of esophagus   . Hiatal hernia   . Ulcerative colitis, unspecified   . Diverticulosis of colon (without mention of hemorrhage)   . Acute gastritis without mention of hemorrhage   . Personal history of colonic polyps 09/11/2010    hyperplasic    Past Surgical History  Procedure Laterality Date  . Nasal sinus surgery    . Tubal ligation    . Cataract extraction    . Thumb arthroscopy      History  Substance Use Topics  . Smoking status: Former Research scientist (life sciences)  . Smokeless tobacco: Never Used  . Alcohol Use: No    Family History  Problem Relation Age of Onset  . Colon cancer Neg Hx   . Thyroid cancer Brother     mets to brain  . Diabetes Mother   . Diabetes  Sister     Allergies  Allergen Reactions  . Codeine     REACTION: Mouth swells    Medication list has been reviewed and updated.  Current Outpatient Prescriptions on File Prior to Visit  Medication Sig Dispense Refill  . AMBULATORY NON FORMULARY MEDICATION Domperidone 84m take one tablet by mouth three times a day before meals and at bedtime  240 tablet  3  . amLODipine (NORVASC) 5 MG tablet Take 5 mg by mouth daily.        .Marland Kitchenaspirin 81 MG tablet Take 81 mg by mouth daily.      .Marland Kitchenatorvastatin (LIPITOR) 20 MG tablet Take 20 mg by mouth daily.       . calcium gluconate 500 MG tablet Take 500 mg by mouth daily.        . cholecalciferol (VITAMIN D) 1000 UNITS tablet Take 1,000 Units by mouth daily.        .Marland Kitchendexlansoprazole (DEXILANT) 60 MG capsule Take 1 capsule (60 mg total) by mouth daily.  20 capsule  0  . losartan (COZAAR) 100 MG tablet Take 100 mg by mouth daily.        . mesalamine (PENTASA) 250 MG CR capsule Take three capsules by mouth once a day  270 capsule  4  . Misc Natural Products (COLON CARE PO) Take 1  tablet by mouth daily.        . Multiple Vitamins-Minerals (CENTRUM SILVER PO) Take 1 tablet by mouth daily.        . mupirocin ointment (BACTROBAN) 2 % Apply to affected area once daily  30 g  0  . pantoprazole (PROTONIX) 40 MG tablet Take 1 tablet (40 mg total) by mouth daily.  90 tablet  6  . vitamin C (ASCORBIC ACID) 500 MG tablet Take 500 mg by mouth daily.         No current facility-administered medications on file prior to visit.    Review of Systems:  As per HPI, otherwise negative.    Physical Examination: Filed Vitals:   06/16/14 1214  BP: 124/62  Pulse: 77  Temp: 98.1 F (36.7 C)  Resp: 20   Filed Vitals:   06/16/14 1214  Height: 5' 0.79" (1.544 m)  Weight: 125 lb 2 oz (56.756 kg)   Body mass index is 23.81 kg/(m^2). Ideal Body Weight: Weight in (lb) to have BMI = 25: 131.1   GEN: WDWN, NAD, Non-toxic, Alert & Oriented x 3 HEENT:  Atraumatic, Normocephalic.  Right eye conjunctival injection and drainage.  No FB.  PRRERLA EOMI Ears and Nose: No external deformity. EXTR: No clubbing/cyanosis/edema NEURO: Normal gait.  PSYCH: Normally interactive. Conversant. Not depressed or anxious appearing.  Calm demeanor.    Assessment and Plan: Conjunctivitis tobrex  Signed,  Ellison Carwin, MD

## 2014-06-16 NOTE — Patient Instructions (Signed)

## 2015-01-30 ENCOUNTER — Encounter: Payer: Self-pay | Admitting: Internal Medicine

## 2015-01-30 ENCOUNTER — Ambulatory Visit (INDEPENDENT_AMBULATORY_CARE_PROVIDER_SITE_OTHER): Payer: Medicare Other | Admitting: Internal Medicine

## 2015-01-30 VITALS — BP 132/70 | HR 76 | Ht 60.5 in | Wt 128.0 lb

## 2015-01-30 DIAGNOSIS — K5190 Ulcerative colitis, unspecified, without complications: Secondary | ICD-10-CM | POA: Diagnosis not present

## 2015-01-30 DIAGNOSIS — K219 Gastro-esophageal reflux disease without esophagitis: Secondary | ICD-10-CM | POA: Diagnosis not present

## 2015-01-30 MED ORDER — RANITIDINE HCL 150 MG PO TABS: 150.0000 mg | ORAL_TABLET | Freq: Two times a day (BID) | ORAL | Status: DC

## 2015-01-30 MED ORDER — MESALAMINE ER 250 MG PO CPCR: ORAL_CAPSULE | ORAL | Status: DC

## 2015-01-30 NOTE — Patient Instructions (Signed)
Please discontinue Pantoprazole.  We have sent the following medications to your pharmacy for you to pick up at your convenience: Ranitidine 150 mg twice daily Pentasa 3 capsules once daily  Please call our office should you find the ranitidine is less effective for your heartburn than the pantoprazole.  Please follow up with Dr Hilarie Fredrickson in 1 year.  CC:Dr Brigitte Pulse

## 2015-01-30 NOTE — Progress Notes (Signed)
Subjective:    Patient ID: Erica Carter, female    DOB: 19-Dec-1934, 80 y.o.   MRN: [id number]  HPI Erica Carter is a 80 year old female with a past medical history of long-standing ulcerative colitis diagnosed greater than 20 years ago, GERD, Schatzki's ring who seen in follow-up. She was last seen in May 2015. After this visit she return for surveillance colonoscopy on 03/29/2014. This showed moderate melanosis throughout the entire examined colon. There were 6 polyps ranging in size from 3-7 mm removed from the cecum, a cine colon, descending colon and sigmoid. There was mild diverticulosis in the left colon. There was no evidence of active colitis though surveillance biopsies were performed. None of the polyps were adenomatous. Surveillance biopsies showed melanosis but no active inflammation or dysplasia.  She has been maintained on Pentasa 250 mg 3 tablets once daily. She also takes pantoprazole 40 mg daily for reflux. She is using B12 injections monthly for long-standing B12 deficiency. Overall she reports she is feeling well. She's had no symptoms of active colitis. She does use an over-the-counter laxative called "Colon Care" which she reports works very well. (This is likely the cause of melanosis). She denies rectal bleeding or melena. Heartburn has been mostly controlled with pantoprazole 40 mg daily. She admits to very occasional breakthrough reflux for which she will use Tums. No dysphagia or odynophagia.   Review of Systems As per history of present illness, otherwise negative  Current Medications, Allergies, Past Medical History, Past Surgical History, Family History and Social History were reviewed in [employer] record.     Objective:   Physical Exam BP 132/70 mmHg  Pulse 76  Ht 5' 0.5" (1.537 m)  Wt 128 lb (58.06 kg)  BMI 24.58 kg/m2 Constitutional: Well-developed and well-nourished. No distress. HEENT: Normocephalic and atraumatic.  Conjunctivae are  normal.  No scleral icterus. Neck: Neck supple. Trachea midline. Cardiovascular: Normal rate, regular rhythm and intact distal pulses. No M/R/G Pulmonary/chest: Effort normal and breath sounds normal. No wheezing, rales or rhonchi. Abdominal: Soft, nontender, nondistended. Bowel sounds active throughout.  Extremities: no clubbing, cyanosis, or edema Neurological: Alert and oriented to person place and time. Skin: Skin is warm and dry. No rashes noted. Psychiatric: Normal mood and affect. Behavior is normal.  Colonoscopy from May 2015 reviewed with the patient  Colonoscopy -- 2011 -- quiescent colitis no dysplasia, fragments of inflammatory pseudopolyp. repeat colonoscopy recommended 2 years EGD -- November 2011 -- sessile antral polyp which appeared inflammatory, normal duodenal folds, normal esophagus. 3 cm hiatal hernia. Pathology result = hyperplastic polyp, negative for H. pylori. Negative for dysplasia.     Assessment & Plan:  80 year old female with a past medical history of long-standing ulcerative colitis diagnosed greater than 20 years ago, GERD, Schatzki's ring who seen in follow-up.  1. U.C., long-standing, now in remission -- she has maintained clinical and endoscopic/histologic remission. She is only taking Pentasa and I'm not sure that this is still necessary. She feels that this medication is necessary and would like to continue. I see little downside to continuing this medication. I have reminded her to have her renal function checked annually by primary care. Continue Pentasa 750 mg daily. --We discussed how normally colonoscopy is performed for surveillance every 12-24 months in patients with long-standing colitis. She requests discontinuation of surveillance based on age and I think this is reasonable given her age at 88 years and also the fact that there has been no active colitis seen recently  either endoscopically or histologically. --Annual flu vaccine recommended, she  reports she has had Pneumovax  2. GERD/regurgitation -- well-controlled. She has read recently about the long-term potential side effects of PPI. She requests another option. Will change to ranitidine 150 mg twice a day. I asked that she notify us if she has return of heartburn or reflux symptoms. She voices understanding. GERD diet and lifestyle reviewed  Return in one year, sooner if necessary

## 2015-03-03 ENCOUNTER — Other Ambulatory Visit: Payer: Self-pay | Admitting: Internal Medicine

## 2015-11-14 DIAGNOSIS — E538 Deficiency of other specified B group vitamins: Secondary | ICD-10-CM | POA: Diagnosis not present

## 2015-11-24 ENCOUNTER — Encounter: Payer: Self-pay | Admitting: Gastroenterology

## 2015-12-12 DIAGNOSIS — E538 Deficiency of other specified B group vitamins: Secondary | ICD-10-CM | POA: Diagnosis not present

## 2015-12-18 ENCOUNTER — Telehealth: Payer: Self-pay | Admitting: Internal Medicine

## 2015-12-18 DIAGNOSIS — K5190 Ulcerative colitis, unspecified, without complications: Secondary | ICD-10-CM

## 2015-12-18 NOTE — Telephone Encounter (Signed)
Patient needs to schedule an appointment. Left voicemail for patient to call back.

## 2015-12-19 MED ORDER — MESALAMINE ER 250 MG PO CPCR: ORAL_CAPSULE | ORAL | Status: DC

## 2015-12-19 NOTE — Telephone Encounter (Signed)
Rx sent to pharmacy. Patient scheduled for 02/05/16 appointment in office. She verbalizes understanding.

## 2016-01-06 ENCOUNTER — Emergency Department (HOSPITAL_COMMUNITY)
Admission: EM | Admit: 2016-01-06 | Discharge: 2016-01-06 | Disposition: A | Discharge status: Home / Self Care | Payer: Commercial Managed Care - HMO | Attending: Emergency Medicine | Admitting: Emergency Medicine

## 2016-01-06 ENCOUNTER — Emergency Department (HOSPITAL_COMMUNITY): Payer: Commercial Managed Care - HMO

## 2016-01-06 ENCOUNTER — Other Ambulatory Visit: Payer: Self-pay

## 2016-01-06 ENCOUNTER — Encounter (HOSPITAL_COMMUNITY): Payer: Self-pay | Admitting: Cardiology

## 2016-01-06 DIAGNOSIS — Z86010 Personal history of colonic polyps: Secondary | ICD-10-CM | POA: Diagnosis not present

## 2016-01-06 DIAGNOSIS — E785 Hyperlipidemia, unspecified: Secondary | ICD-10-CM | POA: Diagnosis not present

## 2016-01-06 DIAGNOSIS — Z79899 Other long term (current) drug therapy: Secondary | ICD-10-CM | POA: Insufficient documentation

## 2016-01-06 DIAGNOSIS — R05 Cough: Secondary | ICD-10-CM | POA: Insufficient documentation

## 2016-01-06 DIAGNOSIS — Z7982 Long term (current) use of aspirin: Secondary | ICD-10-CM | POA: Insufficient documentation

## 2016-01-06 DIAGNOSIS — I10 Essential (primary) hypertension: Secondary | ICD-10-CM | POA: Insufficient documentation

## 2016-01-06 DIAGNOSIS — R058 Other specified cough: Secondary | ICD-10-CM

## 2016-01-06 DIAGNOSIS — R079 Chest pain, unspecified: Secondary | ICD-10-CM | POA: Diagnosis present

## 2016-01-06 DIAGNOSIS — R0789 Other chest pain: Secondary | ICD-10-CM | POA: Diagnosis not present

## 2016-01-06 DIAGNOSIS — Z87891 Personal history of nicotine dependence: Secondary | ICD-10-CM | POA: Insufficient documentation

## 2016-01-06 DIAGNOSIS — K219 Gastro-esophageal reflux disease without esophagitis: Secondary | ICD-10-CM | POA: Diagnosis not present

## 2016-01-06 DIAGNOSIS — R0602 Shortness of breath: Secondary | ICD-10-CM | POA: Diagnosis not present

## 2016-01-06 LAB — CBC
HCT: 44.8 % (ref 36.0–46.0)
Hemoglobin: 14.4 g/dL (ref 12.0–15.0)
MCH: 28.3 pg (ref 26.0–34.0)
MCHC: 32.1 g/dL (ref 30.0–36.0)
MCV: 88.2 fL (ref 78.0–100.0)
PLATELETS: 307 10*3/uL (ref 150–400)
RBC: 5.08 MIL/uL (ref 3.87–5.11)
RDW: 13.5 % (ref 11.5–15.5)
WBC: 6.6 10*3/uL (ref 4.0–10.5)

## 2016-01-06 LAB — BASIC METABOLIC PANEL
Anion gap: 10 (ref 5–15)
BUN: 13 mg/dL (ref 6–20)
CALCIUM: 9.1 mg/dL (ref 8.9–10.3)
CHLORIDE: 108 mmol/L (ref 101–111)
CO2: 25 mmol/L (ref 22–32)
CREATININE: 0.76 mg/dL (ref 0.44–1.00)
GFR calc Af Amer: >60 mL/min (ref >60)
GFR calc non Af Amer: >60 mL/min (ref >60)
GLUCOSE: 139 mg/dL — AB (ref 65–99)
Potassium: 3.7 mmol/L (ref 3.5–5.1)
Sodium: 143 mmol/L (ref 135–145)

## 2016-01-06 LAB — I-STAT TROPONIN, ED: TROPONIN I, POC: 0 ng/mL (ref 0.00–0.08)

## 2016-01-06 MED ORDER — IBUPROFEN 400 MG PO TABS
400.0000 mg | ORAL_TABLET | Freq: Once | ORAL | Status: AC
  Administered 2016-01-06: 400 mg via ORAL
  Filled 2016-01-06: qty 2

## 2016-01-06 NOTE — ED Notes (Signed)
Pt ambulates independently and with steady gait at time of discharge. Discharge instructions and follow up information reviewed with patient. No other questions or concerns voiced at this time.  

## 2016-01-06 NOTE — Discharge Instructions (Signed)
It was our pleasure to provide your ER care today - we hope that you feel better.  Try taking motrin or aleve as need for symptom relief.  Follow up with your doctor for recheck in the coming week - call office to arrange appointment.  Return to ER if worse, new symptoms, fevers, increased difficulty breathing, recurrent/persistent chest pain, other concern.      Chest Wall Pain Chest wall pain is pain in or around the bones and muscles of your chest. Sometimes, an injury causes this pain. Sometimes, the cause may not be known. This pain may take several weeks or longer to get better. HOME CARE INSTRUCTIONS  Pay attention to any changes in your symptoms. Take these actions to help with your pain:   Rest as told by your health care provider.   Avoid activities that cause pain. These include any activities that use your chest muscles or your abdominal and side muscles to lift heavy items.   If directed, apply ice to the painful area:  Put ice in a plastic bag.  Place a towel between your skin and the bag.  Leave the ice on for 20 minutes, 2-3 times per day.  Take over-the-counter and prescription medicines only as told by your health care provider.  Do not use tobacco products, including cigarettes, chewing tobacco, and e-cigarettes. If you need help quitting, ask your health care provider.  Keep all follow-up visits as told by your health care provider. This is important. SEEK MEDICAL CARE IF:  You have a fever.  Your chest pain becomes worse.  You have new symptoms. SEEK IMMEDIATE MEDICAL CARE IF:  You have nausea or vomiting.  You feel sweaty or light-headed.  You have a cough with phlegm (sputum) or you cough up blood.  You develop shortness of breath.   This information is not intended to replace advice given to you by your health care provider. Make sure you discuss any questions you have with your health care provider.   Document Released: 10/25/2005  Document Revised: 07/16/2015 Document Reviewed: 01/20/2015 Elsevier Interactive Patient Education 2016 Else[employer]    Nonspecific Chest Pain  Chest pain can be caused by many different conditions. There is always a chance that your pain could be related to something serious, such as a heart attack or a blood clot in your lungs. Chest pain can also be caused by conditions that are not life-threatening. If you have chest pain, it is very important to follow up with your health care provider. CAUSES  Chest pain can be caused by:  Heartburn.  Pneumonia or bronchitis.  Anxiety or stress.  Inflammation around your heart (pericarditis) or lung (pleuritis or pleurisy).  A blood clot in your lung.  A collapsed lung (pneumothorax). It can develop suddenly on its own (spontaneous pneumothorax) or from trauma to the chest.  Shingles infection (varicella-zoster virus).  Heart attack.  Damage to the bones, muscles, and cartilage that make up your chest wall. This can include:  Bruised bones due to injury.  Strained muscles or cartilage due to frequent or repeated coughing or overwork.  Fracture to one or more ribs.  Sore cartilage due to inflammation (costochondritis). RISK FACTORS  Risk factors for chest pain may include:  Activities that increase your risk for trauma or injury to your chest.  Respiratory infections or conditions that cause frequent coughing.  Medical conditions or overeating that can cause heartburn.  Heart disease or family history of heart disease.  Conditions or  health behaviors that increase your risk of developing a blood clot.  Having had chicken pox (varicella zoster). SIGNS AND SYMPTOMS Chest pain can feel like:  Burning or tingling on the surface of your chest or deep in your chest.  Crushing, pressure, aching, or squeezing pain.  Dull or sharp pain that is worse when you move, cough, or take a deep breath.  Pain that is also felt in your  back, neck, shoulder, or arm, or pain that spreads to any of these areas. Your chest pain may come and go, or it may stay constant. DIAGNOSIS Lab tests or other studies may be needed to find the cause of your pain. Your health care provider may have you take a test called an ambulatory ECG (electrocardiogram). An ECG records your heartbeat patterns at the time the test is performed. You may also have other tests, such as:  Transthoracic echocardiogram (TTE). During echocardiography, sound waves are used to create a picture of all of the heart structures and to look at how blood flows through your heart.  Transesophageal echocardiogram (TEE).This is a more advanced imaging test that obtains images from inside your body. It allows your health care provider to see your heart in finer detail.  Cardiac monitoring. This allows your health care provider to monitor your heart rate and rhythm in real time.  Holter monitor. This is a portable device that records your heartbeat and can help to diagnose abnormal heartbeats. It allows your health care provider to track your heart activity for several days, if needed.  Stress tests. These can be done through exercise or by taking medicine that makes your heart beat more quickly.  Blood tests.  Imaging tests. TREATMENT  Your treatment depends on what is causing your chest pain. Treatment may include:  Medicines. These may include:  Acid blockers for heartburn.  Anti-inflammatory medicine.  Pain medicine for inflammatory conditions.  Antibiotic medicine, if an infection is present.  Medicines to dissolve blood clots.  Medicines to treat coronary artery disease.  Supportive care for conditions that do not require medicines. This may include:  Resting.  Applying heat or cold packs to injured areas.  Limiting activities until pain decreases. HOME CARE INSTRUCTIONS  If you were prescribed an antibiotic medicine, finish it all even if you  start to feel better.  Avoid any activities that bring on chest pain.  Do not use any tobacco products, including cigarettes, chewing tobacco, or electronic cigarettes. If you need help quitting, ask your health care provider.  Do not drink alcohol.  Take medicines only as directed by your health care provider.  Keep all follow-up visits as directed by your health care provider. This is important. This includes any further testing if your chest pain does not go away.  If heartburn is the cause for your chest pain, you may be told to keep your head raised (elevated) while sleeping. This reduces the chance that acid will go from your stomach into your esophagus.  Make lifestyle changes as directed by your health care provider. These may include:  Getting regular exercise. Ask your health care provider to suggest some activities that are safe for you.  Eating a heart-healthy diet. A registered dietitian can help you to learn healthy eating options.  Maintaining a healthy weight.  Managing diabetes, if necessary.  Reducing stress. SEEK MEDICAL CARE IF:  Your chest pain does not go away after treatment.  You have a rash with blisters on your chest.  You have  a fever. SEEK IMMEDIATE MEDICAL CARE IF:   Your chest pain is worse.  You have an increasing cough, or you cough up blood.  You have severe abdominal pain.  You have severe weakness.  You faint.  You have chills.  You have sudden, unexplained chest discomfort.  You have sudden, unexplained discomfort in your arms, back, neck, or jaw.  You have shortness of breath at any time.  You suddenly start to sweat, or your skin gets clammy.  You feel nauseous or you vomit.  You suddenly feel light-headed or dizzy.  Your heart begins to beat quickly, or it feels like it is skipping beats. These symptoms may represent a serious problem that is an emergency. Do not wait to see if the symptoms will go away. Get medical  help right away. Call your local emergency services (911 in the [place] Do not drive yourself to the hospital.   This information is not intended to replace advice given to you by your health care provider. Make sure you discuss any questions you have with your health care provider.   Document Released: 08/04/2005 Document Revised: 11/15/2014 Document Reviewed: 05/31/2014 Elsevier Interactive Patient Education [employer].

## 2016-01-06 NOTE — ED Notes (Signed)
Pt reports chest pain and SOB that started a couple of days ago. Has gotten worse recently.

## 2016-01-06 NOTE — ED Provider Notes (Signed)
CSN: [id number]     Arrival date & time 01/06/16  1443 History   First MD Initiated Contact with Patient 01/06/16 1516     Chief Complaint  Patient presents with  . Chest Pain     (Consider location/radiation/quality/duration/timing/severity/associated sxs/prior Treatment) Patient is a 81 y.o. female presenting with chest pain. The history is provided by the patient.  Chest Pain Associated symptoms: no abdominal pain, no back pain, no fever, no headache, no shortness of breath and not vomiting   Patient c/o right chest pain intermittently in the past couple weeks. Occurs at rest. No relation to activity or exertion.  States when in bed, if turns onto right side will cause to hurt. No associated sob, nv or diaphoresis. Pain dull, moderate, comes and goes. States localized to small area right lateral chest. No hx trauma or fall. Occasional non prod cough - pt indicates 'I always have it..from acid reflux'. No sore throat. No fever or chills. No leg pain or swelling. No hx cad or fam hx premature cad. No recent surgery, travel, immobilty or trauma. No constant pain, no pleuritic pain.         Past Medical History  Diagnosis Date  . Other and unspecified hyperlipidemia   . Unspecified essential hypertension   . Esophageal reflux   . Stricture and stenosis of esophagus   . Hiatal hernia   . Ulcerative colitis, unspecified   . Diverticulosis of colon (without mention of hemorrhage)   . Acute gastritis without mention of hemorrhage   . Personal history of colonic polyps 09/11/2010    hyperplasic   Past Surgical History  Procedure Laterality Date  . Nasal sinus surgery    . Tubal ligation    . Cataract extraction    . Thumb arthroscopy     Family History  Problem Relation Age of Onset  . Colon cancer Neg Hx   . Thyroid cancer Brother     mets to brain  . Diabetes Mother   . Diabetes Sister    Social History  Substance Use Topics  . Smoking status: Former Research scientist (life sciences)  . Smokeless  tobacco: Never Used  . Alcohol Use: No   OB History    No data available     Review of Systems  Constitutional: Negative for fever and chills.  HENT: Negative for sore throat.   Eyes: Negative for redness.  Respiratory: Negative for shortness of breath.   Cardiovascular: Positive for chest pain. Negative for leg swelling.  Gastrointestinal: Negative for vomiting and abdominal pain.  Genitourinary: Negative for flank pain.  Musculoskeletal: Negative for back pain and neck pain.  Skin: Negative for rash.  Neurological: Negative for headaches.  Hematological: Does not bruise/bleed easily.  Psychiatric/Behavioral: Negative for confusion.      Allergies  Codeine  Home Medications   Prior to Admission medications   Medication Sig Start Date End Date Taking? Authorizing Provider  amLODipine (NORVASC) 10 MG tablet Take 10 mg by mouth daily.  11/18/14   Historical Provider, MD  aspirin 81 MG tablet Take 81 mg by mouth daily.    Historical Provider, MD  atorvastatin (LIPITOR) 20 MG tablet Take 20 mg by mouth daily.     Historical Provider, MD  calcium gluconate 500 MG tablet Take 500 mg by mouth daily.      Historical Provider, MD  cholecalciferol (VITAMIN D) 1000 UNITS tablet Take 1,000 Units by mouth daily.      Historical Provider, MD  Cyanocobalamin (VITAMIN B-12 IJ)  Inject as directed. Pt gets shot once a month    Historical Provider, MD  losartan (COZAAR) 100 MG tablet Take 100 mg by mouth daily.      Historical Provider, MD  mesalamine (PENTASA) 250 MG CR capsule Take three capsules by mouth once a day 12/19/15   Jerene Bears, MD  Misc Natural Products (COLON CARE PO) Take 1 tablet by mouth daily.      Historical Provider, MD  Multiple Vitamins-Minerals (CENTRUM SILVER PO) Take 1 tablet by mouth daily.      Historical Provider, MD  ranitidine (ZANTAC) 150 MG tablet Take 1 tablet (150 mg total) by mouth 2 (two) times daily. 01/30/15   Jerene Bears, MD  vitamin C (ASCORBIC ACID) 500  MG tablet Take 500 mg by mouth daily.      Historical Provider, MD   BP 127/67 mmHg  Pulse 86  Temp(Src) 98.1 F (36.7 C) (Oral)  Resp 18  Wt 58.06 kg  SpO2 99% Physical Exam  Constitutional: She is oriented to person, place, and time. She appears well-developed and well-nourished. No distress.  HENT:  Mouth/Throat: Oropharynx is clear and moist.  Eyes: Conjunctivae are normal. No scleral icterus.  Neck: Neck supple. No tracheal deviation present.  Cardiovascular: Normal rate, regular rhythm, normal heart sounds and intact distal pulses.  Exam reveals no gallop and no friction rub.   No murmur heard. Pulmonary/Chest: Effort normal. No respiratory distress. She exhibits tenderness.  +right lateral chest wall tenderness reproducing symptoms. No mass felt. No crepitus.   Abdominal: Soft. Normal appearance and bowel sounds are normal. She exhibits no distension. There is no tenderness.  Musculoskeletal: She exhibits no edema or tenderness.  Neurological: She is alert and oriented to person, place, and time.  Skin: Skin is warm and dry. No rash noted. She is not diaphoretic.  Psychiatric: She has a normal mood and affect.  Nursing note and vitals reviewed.   ED Course  Procedures (including critical care time) Labs Review   Results for orders placed or performed during the hospital encounter of 93/73/42  Basic metabolic panel  Result Value Ref Range   Sodium 143 135 - 145 mmol/L   Potassium 3.7 3.5 - 5.1 mmol/L   Chloride 108 101 - 111 mmol/L   CO2 25 22 - 32 mmol/L   Glucose, Bld 139 (H) 65 - 99 mg/dL   BUN 13 6 - 20 mg/dL   Creatinine, Ser 0.76 0.44 - 1.00 mg/dL   Calcium 9.1 8.9 - 10.3 mg/dL   GFR calc non Af Amer >60 >60 mL/min   GFR calc Af Amer >60 >60 mL/min   Anion gap 10 5 - 15  CBC  Result Value Ref Range   WBC 6.6 4.0 - 10.5 K/uL   RBC 5.08 3.87 - 5.11 MIL/uL   Hemoglobin 14.4 12.0 - 15.0 g/dL   HCT 44.8 36.0 - 46.0 %   MCV 88.2 78.0 - 100.0 fL   MCH 28.3  26.0 - 34.0 pg   MCHC 32.1 30.0 - 36.0 g/dL   RDW 13.5 11.5 - 15.5 %   Platelets 307 150 - 400 K/uL  I-stat troponin, ED (not at [facility])  Result Value Ref Range   Troponin i, poc 0.00 0.00 - 0.08 ng/mL   Comment 3           Dg Chest 2 View  01/06/2016  CLINICAL DATA:  Chest pain and shortness of breath EXAM: CHEST  2 VIEW COMPARISON:  3/30 1/8 FINDINGS: The heart size and mediastinal contours are within normal limits. Asymmetric elevation of the right hemidiaphragm noted. Both lungs are clear. The visualized skeletal structures are unremarkable. IMPRESSION: No active cardiopulmonary disease. Electronically Signed   By: Kerby Moors M.D.   On: 01/06/2016 15:30       I have personally reviewed and evaluated these images and lab results as part of my medical decision-making.   EKG Interpretation   Date/Time:  Tuesday January 06 2016 15:58:58 EST Ventricular Rate:  74 PR Interval:  164 QRS Duration: 122 QT Interval:  438 QTC Calculation: 486 R Axis:   31 Text Interpretation:  Sinus rhythm Left bundle branch block No previous  tracing Confirmed by Ashok Cordia  MD, Lennette Bihari ([id number]) on 01/06/2016 4:21:34 PM      MDM   Iv ns. Labs.  Reviewed nursing notes and prior charts for additional history.   After symptoms x 2 weeks, trop 0.    Pain intermittent/not pleuritic, no sob - does not appear c/w PE.  Symptoms at rest, no relation to exertion, no unusual doe or fatigue - symptoms do not appear c/w ACS  Pt w localized right lateral chest pain which seems musculoskeletal in etiology.    Motrin po for pain.   Pt currently appears stable for d/c.      Lajean Saver, MD 01/06/16 1623

## 2016-01-09 DIAGNOSIS — E538 Deficiency of other specified B group vitamins: Secondary | ICD-10-CM | POA: Diagnosis not present

## 2016-01-09 DIAGNOSIS — R079 Chest pain, unspecified: Secondary | ICD-10-CM | POA: Diagnosis not present

## 2016-01-09 DIAGNOSIS — D692 Other nonthrombocytopenic purpura: Secondary | ICD-10-CM | POA: Diagnosis not present

## 2016-01-09 DIAGNOSIS — M542 Cervicalgia: Secondary | ICD-10-CM | POA: Diagnosis not present

## 2016-01-09 DIAGNOSIS — K219 Gastro-esophageal reflux disease without esophagitis: Secondary | ICD-10-CM | POA: Diagnosis not present

## 2016-02-05 ENCOUNTER — Ambulatory Visit (INDEPENDENT_AMBULATORY_CARE_PROVIDER_SITE_OTHER): Payer: Commercial Managed Care - HMO | Admitting: Internal Medicine

## 2016-02-05 ENCOUNTER — Telehealth: Payer: Self-pay | Admitting: Internal Medicine

## 2016-02-05 ENCOUNTER — Encounter: Payer: Self-pay | Admitting: Internal Medicine

## 2016-02-05 VITALS — BP 140/84 | HR 84 | Ht 60.5 in | Wt 126.4 lb

## 2016-02-05 DIAGNOSIS — K219 Gastro-esophageal reflux disease without esophagitis: Secondary | ICD-10-CM

## 2016-02-05 DIAGNOSIS — K5190 Ulcerative colitis, unspecified, without complications: Secondary | ICD-10-CM | POA: Diagnosis not present

## 2016-02-05 MED ORDER — RANITIDINE HCL 150 MG PO TABS: 150.0000 mg | ORAL_TABLET | Freq: Two times a day (BID) | ORAL | Status: DC

## 2016-02-05 NOTE — Patient Instructions (Addendum)
We have sent the following medications to your pharmacy for you to pick up at your convenience: Ranitidine 150 mg every 12 hours (increase from once daily dosing)  Decrease Pentasa to twice daily dosing.  Call our office in 2 weeks to let us know how you are doing on the Ranitidine. If your regurgitation and GERD are no better, we may switch you to pantoprazole.   Follow up with Dr Hilarie Fredrickson in 1 year.

## 2016-02-05 NOTE — Telephone Encounter (Signed)
Rx sent 

## 2016-02-06 DIAGNOSIS — E538 Deficiency of other specified B group vitamins: Secondary | ICD-10-CM | POA: Diagnosis not present

## 2016-02-06 NOTE — Progress Notes (Signed)
Subjective:    Patient ID: Erica Carter, female    DOB: 1935/04/02, 81 y.o.   MRN: [id number]  HPI Lewanda Perea is a 81 year old female with a history of long-standing ulcerative colitis diagnosed greater than 20 years ago, GERD, Schatzki's ring is here for follow-up. She was last seen in March 2016. She is here alone today. She reports that her heartburn has been problematic for her recently and this is primarily regurgitation and sour taste in her mouth. She has noticed some hoarseness and change in her seeing voice. She denies dysphagia or odynophagia. Denies nausea or vomiting. No decreased appetite or early satiety. At last visit over concern for possible long-term PPI side effect we switched her to ranitidine. The intention was for 150 twice a day but she's been using this once daily at night. She has not tried any other over-the-counter heartburn remedies. From a colitis standpoint she reports feeling well. She continues to use the over-the-counter laxative called "Colon Care" every other day. She is having regular bowel movements without blood or melena. She denies abdominal pain. She does continue Pentasa 250 mg 3 times daily.  Her last colonoscopy was 03/29/2014. This showed moderate melanosis throughout the entire colon. There were 6 polyps ranging in size from 3-7 mm removed from the cecum, ascending colon, descending colon sigmoid. There was mild diverticulosis in the left colon. There was no evidence of active colitis on surveillance biopsies. None of the polyps were adenomatous.   Review of Systems As per history of present illness, otherwise negative  Current Medications, Allergies, Past Medical History, Past Surgical History, Family History and Social History were reviewed in [employer] record.     Objective:   Physical Exam BP 140/84 mmHg  Pulse 84  Ht 5' 0.5" (1.537 m)  Wt 126 lb 6 oz (57.323 kg)  BMI 24.27 kg/m2 Constitutional: Well-developed  and well-nourished. No distress. HEENT: Normocephalic and atraumatic. Conjunctivae are normal.  No scleral icterus. Neck: Neck supple. Trachea midline. Cardiovascular: Normal rate, regular rhythm and intact distal pulses. No M/R/G Pulmonary/chest: Effort normal and breath sounds normal. No wheezing, rales or rhonchi. Abdominal: Soft, nontender, nondistended. Bowel sounds active throughout.  Extremities: no clubbing, cyanosis, or edema Neurological: Alert and oriented to person place and time. Skin: Skin is warm and dry. No rashes noted. Psychiatric: Normal mood and affect. Behavior is normal.      Assessment & Plan:  81 year old female with a history of long-standing ulcerative colitis diagnosed greater than 20 years ago, GERD, Schatzki's ring is here for follow-up.  1. GERD -- Now with uncontrolled reflux manifested by water brash and hoarseness. We switched from PPI to H2 blocker but unfortunately she's only been using this once daily. I explained that we should use this twice a day. She will try this and if symptoms do not improve, she is asked to notify me and we would plan to resume pantoprazole 40 mg once daily. Previously on PPI symptoms were well-controlled no history of Barrett's esophagus and no alarm symptoms to warrant repeat upper endoscopy at present.  2. UC in remission -- she is maintained clinical and histologic remission. She is on low-dose Pentasa which is expensive. She is asking to decrease the dose to twice daily. We will try this and so she will decrease to Pentasa 250 mg twice a day. We discussed previously her desire to discontinue surveillance colonoscopy based on age and given the endoscopic and histologic findings after her last exam I  feel that this is reasonable. --Notify me of any active colitis symptoms --Annual flu vaccination recommended, previous recipient of Pneumovax  One year follow-up, sooner if necessary 15 minutes spent with the patient today. Greater  than 50% was spent in counseling and coordination of care with the patient

## 2016-02-17 ENCOUNTER — Telehealth: Payer: Self-pay | Admitting: Internal Medicine

## 2016-02-17 MED ORDER — PANTOPRAZOLE SODIUM 40 MG PO TBEC: 40.0000 mg | DELAYED_RELEASE_TABLET | Freq: Every day | ORAL | Status: DC

## 2016-02-17 NOTE — Telephone Encounter (Signed)
I have spoken to patient and she states that the ranitidine twice daily is no longer effective. I advised that per Dr Vena Rua last office note, if the H2 blocker stopped working, he would try her on pantoprazole once daily again. I have sent a prescription for the pantoprazole and patient will let us know if this is not effective either.

## 2016-02-25 ENCOUNTER — Telehealth: Payer: Self-pay | Admitting: Internal Medicine

## 2016-02-25 NOTE — Telephone Encounter (Signed)
A user error has taken place.

## 2016-02-27 ENCOUNTER — Other Ambulatory Visit: Payer: Self-pay | Admitting: Internal Medicine

## 2016-03-05 DIAGNOSIS — E538 Deficiency of other specified B group vitamins: Secondary | ICD-10-CM | POA: Diagnosis not present

## 2016-03-10 ENCOUNTER — Other Ambulatory Visit: Payer: Self-pay | Admitting: *Deleted

## 2016-03-10 MED ORDER — PANTOPRAZOLE SODIUM 40 MG PO TBEC: 40.0000 mg | DELAYED_RELEASE_TABLET | Freq: Every day | ORAL | Status: DC

## 2016-03-12 ENCOUNTER — Encounter: Payer: Self-pay | Admitting: *Deleted

## 2016-03-12 ENCOUNTER — Telehealth: Payer: Self-pay | Admitting: Internal Medicine

## 2016-03-12 NOTE — Telephone Encounter (Signed)
Rx was sent 03/10/16 for #90 w/1 refill to [facility]

## 2016-04-02 DIAGNOSIS — E538 Deficiency of other specified B group vitamins: Secondary | ICD-10-CM | POA: Diagnosis not present

## 2016-04-29 DIAGNOSIS — E538 Deficiency of other specified B group vitamins: Secondary | ICD-10-CM | POA: Diagnosis not present

## 2016-05-04 ENCOUNTER — Other Ambulatory Visit: Payer: Self-pay | Admitting: Internal Medicine

## 2016-06-02 ENCOUNTER — Other Ambulatory Visit: Payer: Self-pay | Admitting: Internal Medicine

## 2016-06-04 DIAGNOSIS — M810 Age-related osteoporosis without current pathological fracture: Secondary | ICD-10-CM | POA: Diagnosis not present

## 2016-06-04 DIAGNOSIS — I129 Hypertensive chronic kidney disease with stage 1 through stage 4 chronic kidney disease, or unspecified chronic kidney disease: Secondary | ICD-10-CM | POA: Diagnosis not present

## 2016-06-04 DIAGNOSIS — K5190 Ulcerative colitis, unspecified, without complications: Secondary | ICD-10-CM | POA: Diagnosis not present

## 2016-06-04 DIAGNOSIS — D692 Other nonthrombocytopenic purpura: Secondary | ICD-10-CM | POA: Diagnosis not present

## 2016-06-04 DIAGNOSIS — N183 Chronic kidney disease, stage 3 (moderate): Secondary | ICD-10-CM | POA: Diagnosis not present

## 2016-06-04 DIAGNOSIS — E1122 Type 2 diabetes mellitus with diabetic chronic kidney disease: Secondary | ICD-10-CM | POA: Diagnosis not present

## 2016-06-04 DIAGNOSIS — K219 Gastro-esophageal reflux disease without esophagitis: Secondary | ICD-10-CM | POA: Diagnosis not present

## 2016-06-04 DIAGNOSIS — Z0000 Encounter for general adult medical examination without abnormal findings: Secondary | ICD-10-CM | POA: Diagnosis not present

## 2016-06-04 DIAGNOSIS — E782 Mixed hyperlipidemia: Secondary | ICD-10-CM | POA: Diagnosis not present

## 2016-06-09 ENCOUNTER — Telehealth: Payer: Self-pay | Admitting: Internal Medicine

## 2016-06-09 NOTE — Telephone Encounter (Signed)
I have contacted Agency. They never received rx we sent to them on 05/04/16 although or system indicates "receipt confirmed by pharmacy." I have given verbal order for Pentasa 250 mg 1 tablet twice daily #180 with 2 refills.

## 2016-06-14 ENCOUNTER — Telehealth: Payer: Self-pay | Admitting: Internal Medicine

## 2016-06-14 NOTE — Telephone Encounter (Signed)
Patient advised that we gave verbal orders to [facility] for Pentasa on 06/09/16. She verbalizes understanding.

## 2016-07-02 DIAGNOSIS — E538 Deficiency of other specified B group vitamins: Secondary | ICD-10-CM | POA: Diagnosis not present

## 2016-07-27 DIAGNOSIS — H52203 Unspecified astigmatism, bilateral: Secondary | ICD-10-CM | POA: Diagnosis not present

## 2016-07-27 DIAGNOSIS — Z961 Presence of intraocular lens: Secondary | ICD-10-CM | POA: Diagnosis not present

## 2016-07-27 DIAGNOSIS — E119 Type 2 diabetes mellitus without complications: Secondary | ICD-10-CM | POA: Diagnosis not present

## 2016-07-30 DIAGNOSIS — Z23 Encounter for immunization: Secondary | ICD-10-CM | POA: Diagnosis not present

## 2016-07-30 DIAGNOSIS — E538 Deficiency of other specified B group vitamins: Secondary | ICD-10-CM | POA: Diagnosis not present

## 2016-08-27 DIAGNOSIS — E538 Deficiency of other specified B group vitamins: Secondary | ICD-10-CM | POA: Diagnosis not present

## 2016-09-06 ENCOUNTER — Other Ambulatory Visit: Payer: Self-pay | Admitting: Internal Medicine

## 2016-09-24 DIAGNOSIS — E538 Deficiency of other specified B group vitamins: Secondary | ICD-10-CM | POA: Diagnosis not present

## 2016-10-18 DIAGNOSIS — Z1231 Encounter for screening mammogram for malignant neoplasm of breast: Secondary | ICD-10-CM | POA: Diagnosis not present

## 2016-10-22 DIAGNOSIS — E538 Deficiency of other specified B group vitamins: Secondary | ICD-10-CM | POA: Diagnosis not present

## 2016-11-20 ENCOUNTER — Other Ambulatory Visit: Payer: Self-pay | Admitting: Internal Medicine

## 2016-11-23 DIAGNOSIS — E538 Deficiency of other specified B group vitamins: Secondary | ICD-10-CM | POA: Diagnosis not present

## 2016-12-09 DIAGNOSIS — G4700 Insomnia, unspecified: Secondary | ICD-10-CM | POA: Diagnosis not present

## 2016-12-09 DIAGNOSIS — D692 Other nonthrombocytopenic purpura: Secondary | ICD-10-CM | POA: Diagnosis not present

## 2016-12-09 DIAGNOSIS — I129 Hypertensive chronic kidney disease with stage 1 through stage 4 chronic kidney disease, or unspecified chronic kidney disease: Secondary | ICD-10-CM | POA: Diagnosis not present

## 2016-12-09 DIAGNOSIS — J069 Acute upper respiratory infection, unspecified: Secondary | ICD-10-CM | POA: Diagnosis not present

## 2016-12-09 DIAGNOSIS — E1122 Type 2 diabetes mellitus with diabetic chronic kidney disease: Secondary | ICD-10-CM | POA: Diagnosis not present

## 2016-12-09 DIAGNOSIS — N183 Chronic kidney disease, stage 3 (moderate): Secondary | ICD-10-CM | POA: Diagnosis not present

## 2016-12-09 DIAGNOSIS — E782 Mixed hyperlipidemia: Secondary | ICD-10-CM | POA: Diagnosis not present

## 2016-12-09 DIAGNOSIS — J301 Allergic rhinitis due to pollen: Secondary | ICD-10-CM | POA: Diagnosis not present

## 2016-12-09 DIAGNOSIS — K5190 Ulcerative colitis, unspecified, without complications: Secondary | ICD-10-CM | POA: Diagnosis not present

## 2016-12-24 DIAGNOSIS — E538 Deficiency of other specified B group vitamins: Secondary | ICD-10-CM | POA: Diagnosis not present

## 2017-01-11 ENCOUNTER — Telehealth: Payer: Self-pay | Admitting: Internal Medicine

## 2017-01-11 NOTE — Telephone Encounter (Signed)
I have spoken to patient to advise that she should contact her insurance company to ask which mesalamine product they prefer she take on her plan. She is to contact us back once she finds this out. She has also scheduled routine follow up with Dr Hilarie Fredrickson in April.

## 2017-01-12 ENCOUNTER — Telehealth: Payer: Self-pay | Admitting: Internal Medicine

## 2017-01-12 MED ORDER — MESALAMINE 1.2 G PO TBEC: 2.4000 g | DELAYED_RELEASE_TABLET | Freq: Every day | ORAL | 0 refills | Status: DC

## 2017-01-12 NOTE — Telephone Encounter (Signed)
Per Dr Hilarie Fredrickson, we can try to send Lialda 2.4 grams daily to see if this is covered. Rx has been sent and patient has been advised of switch to Lialda from Pentasa.

## 2017-01-13 ENCOUNTER — Telehealth: Payer: Self-pay | Admitting: Internal Medicine

## 2017-01-13 NOTE — Telephone Encounter (Signed)
We sent Lialda to her pharmacy yesterday and I spoke to her insurance representative about this yesterday evening. Lets see if her insurance will cover that first before we start changing meds around again.

## 2017-01-21 DIAGNOSIS — E538 Deficiency of other specified B group vitamins: Secondary | ICD-10-CM | POA: Diagnosis not present

## 2017-01-24 ENCOUNTER — Other Ambulatory Visit: Payer: Self-pay | Admitting: Internal Medicine

## 2017-02-10 ENCOUNTER — Other Ambulatory Visit: Payer: Self-pay | Admitting: Internal Medicine

## 2017-02-18 DIAGNOSIS — E538 Deficiency of other specified B group vitamins: Secondary | ICD-10-CM | POA: Diagnosis not present

## 2017-02-24 ENCOUNTER — Encounter: Payer: Self-pay | Admitting: Internal Medicine

## 2017-02-24 ENCOUNTER — Ambulatory Visit (INDEPENDENT_AMBULATORY_CARE_PROVIDER_SITE_OTHER): Payer: Medicare HMO | Admitting: Internal Medicine

## 2017-02-24 VITALS — BP 130/68 | HR 80 | Ht 61.0 in | Wt 131.4 lb

## 2017-02-24 DIAGNOSIS — G4700 Insomnia, unspecified: Secondary | ICD-10-CM

## 2017-02-24 DIAGNOSIS — K219 Gastro-esophageal reflux disease without esophagitis: Secondary | ICD-10-CM | POA: Diagnosis not present

## 2017-02-24 DIAGNOSIS — K5100 Ulcerative (chronic) pancolitis without complications: Secondary | ICD-10-CM | POA: Diagnosis not present

## 2017-02-24 MED ORDER — MESALAMINE 1.2 G PO TBEC: 2.4000 g | DELAYED_RELEASE_TABLET | Freq: Every day | ORAL | 2 refills | Status: DC

## 2017-02-24 MED ORDER — RANITIDINE HCL 150 MG PO TABS: 150.0000 mg | ORAL_TABLET | Freq: Every day | ORAL | 2 refills | Status: DC

## 2017-02-24 MED ORDER — PANTOPRAZOLE SODIUM 40 MG PO TBEC: 40.0000 mg | DELAYED_RELEASE_TABLET | Freq: Every day | ORAL | 2 refills | Status: DC

## 2017-02-24 NOTE — Patient Instructions (Addendum)
Continue pantoprazole 40 mg daily.  We have sent the following medications to your pharmacy for you to pick up at your convenience: raniditine 150 mg every evening.  Continue Lialda 2.4 mg daily.  Talk to your primary care provider regarding insomnia (trouble sleeping).   Please follow up with Dr Hilarie Fredrickson in 1 year.  If you are age 80 or older, your body mass index should be between 23-30. Your Body mass index is 24.83 kg/m. If this is out of the aforementioned range listed, please consider follow up with your Primary Care Provider.  If you are age 38 or younger, your body mass index should be between 19-25. Your Body mass index is 24.83 kg/m. If this is out of the aformentioned range listed, please consider follow up with your Primary Care Provider.

## 2017-02-24 NOTE — Progress Notes (Signed)
Subjective:    Patient ID: Erica Carter, female    DOB: 1935-10-18, 82 y.o.   MRN: [id number]  HPI Erica Carter is a 82 -year-old female with a history of ulcerative colitis diagnosed greater than 20 years ago, GERD who is here for follow-up. She was last seen one year ago. She is here alone today. She reports that she is doing well today. At the time of her last visit we increased her ranitidine to twice daily but she was having breakthrough and ongoing reflux symptoms. She was changed to pantoprazole 40 mg a day and with this symptoms have been much better control. She still at times will have regurgitation at night area this is several days per week. Some mild hoarseness when this occurs but not an ongoing issue. Denies dysphagia and odynophagia. Good appetite. No nausea or vomiting.  From a colitis perspective she reports her bowel movements have been regular. She is using the over-the-counter product "Colon Care" every other day. If she does not use this she feels constipated and if she uses it daily she gets stools that are too loose. She denies rectal bleeding and melena. No abdominal pain. Denies bloating and incomplete defecation. She has been using Lialda 2.4 g a day and she feels that this is definitely helpful. Previously she was taking Pentasa and she feels that Pitt works better and is more affordable.  She has having issue with bladder leakage and using an over-the-counter product which she feels is helping. She reports some insomnia and trouble sleeping. She states she tends to worry at night and works herself up. She is living with her daughter and 31 year old grandson. Her grandson can be stressful for her. She is tried Tylenol PM in the past and thinks it may help some. She also reports nocturnal back pain which also interferes with her sleep.   Review of Systems As per history of present illness, otherwise negative  Current Medications, Allergies, Past Medical History, Past  Surgical History, Family History and Social History were reviewed in [employer] record.     Objective:   Physical Exam BP 130/68   Pulse 80   Ht 5' 1"  (1.549 m)   Wt 131 lb 6.4 oz (59.6 kg)   BMI 24.83 kg/m  Constitutional: Well-developed and well-nourished. No distress. HEENT: Normocephalic and atraumatic. Oropharynx is clear and moist. Conjunctivae are normal.  No scleral icterus. Neck: Neck supple. Trachea midline. Cardiovascular: Normal rate, regular rhythm and intact distal pulses. No M/R/G Pulmonary/chest: Effort normal and breath sounds normal. No wheezing, rales or rhonchi. Abdominal: Soft, nontender, nondistended. Bowel sounds active throughout. There are no masses palpable. No hepatosplenomegaly. Extremities: no clubbing, cyanosis, or edema Neurological: Alert and oriented to person place and time. Skin: Skin is warm and dry. Psychiatric: Normal mood and affect. Behavior is normal.  CBC    Component Value Date/Time   WBC 6.6 01/06/2016 1455   RBC 5.08 01/06/2016 1455   HGB 14.4 01/06/2016 1455   HCT 44.8 01/06/2016 1455   PLT 307 01/06/2016 1455   MCV 88.2 01/06/2016 1455   MCH 28.3 01/06/2016 1455   MCHC 32.1 01/06/2016 1455   RDW 13.5 01/06/2016 1455   CMP     Component Value Date/Time   NA 143 01/06/2016 1455   K 3.7 01/06/2016 1455   CL 108 01/06/2016 1455   CO2 25 01/06/2016 1455   GLUCOSE 139 (H) 01/06/2016 1455   BUN 13 01/06/2016 1455   CREATININE 0.76  01/06/2016 1455   CALCIUM 9.1 01/06/2016 1455   GFRNONAA >60 01/06/2016 1455   GFRAA >60 01/06/2016 1455      Assessment & Plan:  82 -year-old female with a history of ulcerative colitis diagnosed greater than 20 years ago, GERD who is here for follow-up.  1. UC -- and clinical remission and doing well on Lialda. We'll continue Lialda 2.4 g a day. We spoke again today about surveillance colonoscopy and she prefers to discontinue screening based on her age and stability of  her ulcerative colitis. This is reasonable to me. --Continue Lialda 2.4 g a day --Annual flu vaccine recommended, she has received Pneumovax  2. GERD -- improved with reinitiation of PPI. Some nocturnal breakthrough symptoms. Continue pantoprazole 40 mg once daily. Add ranitidine 150 mg in the evening. GERD precautions and dietary modifications discussed.  3. Insomnia -- I encouraged her to speak to her primary care provider, Dr. Brigitte Pulse, about her insomnia for other possible pharmacologic options. She plans to do this at her next follow-up visit which she thinks is in May.  Annual follow-up, sooner if necessary 25 minutes spent with the patient today. Greater than 50% was spent in counseling and coordination of care with the patient

## 2017-03-18 DIAGNOSIS — E538 Deficiency of other specified B group vitamins: Secondary | ICD-10-CM | POA: Diagnosis not present

## 2017-04-15 DIAGNOSIS — E538 Deficiency of other specified B group vitamins: Secondary | ICD-10-CM | POA: Diagnosis not present

## 2017-05-17 DIAGNOSIS — E538 Deficiency of other specified B group vitamins: Secondary | ICD-10-CM | POA: Diagnosis not present

## 2017-06-14 ENCOUNTER — Emergency Department (HOSPITAL_COMMUNITY): Payer: Medicare HMO

## 2017-06-14 ENCOUNTER — Emergency Department (HOSPITAL_COMMUNITY)
Admission: EM | Admit: 2017-06-14 | Discharge: 2017-06-14 | Disposition: A | Discharge status: Home / Self Care | Payer: Medicare HMO | Attending: Emergency Medicine | Admitting: Emergency Medicine

## 2017-06-14 ENCOUNTER — Encounter (HOSPITAL_COMMUNITY): Payer: Self-pay

## 2017-06-14 DIAGNOSIS — M25552 Pain in left hip: Secondary | ICD-10-CM | POA: Diagnosis not present

## 2017-06-14 DIAGNOSIS — M1612 Unilateral primary osteoarthritis, left hip: Secondary | ICD-10-CM | POA: Insufficient documentation

## 2017-06-14 DIAGNOSIS — Z7982 Long term (current) use of aspirin: Secondary | ICD-10-CM | POA: Diagnosis not present

## 2017-06-14 DIAGNOSIS — Z87891 Personal history of nicotine dependence: Secondary | ICD-10-CM | POA: Diagnosis not present

## 2017-06-14 DIAGNOSIS — Z79899 Other long term (current) drug therapy: Secondary | ICD-10-CM | POA: Diagnosis not present

## 2017-06-14 DIAGNOSIS — I10 Essential (primary) hypertension: Secondary | ICD-10-CM | POA: Insufficient documentation

## 2017-06-14 DIAGNOSIS — S79912A Unspecified injury of left hip, initial encounter: Secondary | ICD-10-CM | POA: Diagnosis not present

## 2017-06-14 DIAGNOSIS — M1610 Unilateral primary osteoarthritis, unspecified hip: Secondary | ICD-10-CM

## 2017-06-14 MED ORDER — TRAMADOL HCL 50 MG PO TABS
50.0000 mg | ORAL_TABLET | Freq: Four times a day (QID) | ORAL | Status: AC | PRN
Start: 2017-06-14 — End: 2017-06-14
  Administered 2017-06-14: 50 mg via ORAL
  Filled 2017-06-14: qty 1

## 2017-06-14 MED ORDER — TRAMADOL HCL 50 MG PO TABS: 50.0000 mg | ORAL_TABLET | Freq: Three times a day (TID) | ORAL | 0 refills | Status: DC | PRN

## 2017-06-14 NOTE — ED Notes (Signed)
Case mx notified

## 2017-06-14 NOTE — ED Triage Notes (Signed)
Patient complains of left hip/leg pain since awakening on Sunday. Denies any known trauma. Pain becomes severe with ambulation. Alert and oriented.

## 2017-06-14 NOTE — Discharge Instructions (Signed)
Take the medications as needed for pain. He can also continue ibuprofen. Use a walker to help rest her leg. Follow-up with an orthopedic doctor for further treatment and evaluation.

## 2017-06-14 NOTE — ED Notes (Addendum)
Left  hip pain since Sunday, states has no idea what she did to make it hurt, when she walks it makes her knee hurt

## 2017-06-14 NOTE — Discharge Planning (Signed)
Fuller Mandril, RN, BSN, [place] [phone] Pt qualifies for DME rolling walker.  DME  ordered through [facility].  Edwinna Areola of [facility] notified to deliver rolling walker to pt room prior to D/C home.

## 2017-06-14 NOTE — ED Notes (Signed)
Waiting on walker

## 2017-06-14 NOTE — ED Provider Notes (Signed)
[place] DEPT Provider Note   CSN: [id number] Arrival date & time: 06/14/17  0347     History   Chief Complaint Chief Complaint  Patient presents with  . Hip Pain    HPI Erica Carter is a 82 y.o. female.  HPI Patient presents to the emergency room for evaluation of left hip pain. Patient states symptoms started on Sunday. She does not recall any particular injuries. The pain is primarily in her left hip that she also feels it in her knee. The pain increases whenever she tries to walk around. At rest it does not bother her much. She denies any fevers or chills.  No numbness or weakness. No trouble with back pain. No vomiting or diarrhea. No abdominal pain. Past Medical History:  Diagnosis Date  . Acute gastritis without mention of hemorrhage   . Diverticulosis of colon (without mention of hemorrhage)   . Esophageal reflux   . Hiatal hernia   . Other and unspecified hyperlipidemia   . Personal history of colonic polyps 09/11/2010   hyperplasic  . Stricture and stenosis of esophagus   . Ulcerative colitis, unspecified   . Unspecified essential hypertension     Patient Active Problem List   Diagnosis Date Noted  . History of gastroesophageal reflux (GERD) 10/26/2011  . GASTRITIS, ACUTE W/O HEMORRHAGE 09/11/2010  . HYPERLIPIDEMIA 02/12/2008  . HYPERTENSION 02/12/2008  . GERD 02/12/2008  . 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 HIATAL HERNIA 05/17/2006  . COLITIS, ULCERATIVE 05/17/2006  . DIVERTICULOSIS, COLON 05/17/2006    Past Surgical History:  Procedure Laterality Date  . CATARACT EXTRACTION    . NASAL SINUS SURGERY    . THUMB ARTHROSCOPY    . TUBAL LIGATION      OB History    No data available       Home Medications    Prior to Admission medications   Medication Sig Start Date End Date Taking? Authorizing Provider  amLODipine (NORVASC) 10 MG tablet Take 10 mg by mouth daily.  11/18/14   [provider]  aspirin 81 MG tablet Take 81 mg by mouth  daily.    [provider]  atorvastatin (LIPITOR) 20 MG tablet Take 20 mg by mouth daily.     [provider]  calcium gluconate 500 MG tablet Take 500 mg by mouth daily.      [provider]  cholecalciferol (VITAMIN D) 1000 UNITS tablet Take 1,000 Units by mouth daily.      [provider]  Cyanocobalamin (VITAMIN B-12 IJ) Inject as directed. Pt gets shot once a month    [provider]  losartan (COZAAR) 100 MG tablet Take 100 mg by mouth daily.      [provider]  mesalamine (LIALDA) 1.2 g EC tablet Take 2 tablets (2.4 g total) by mouth daily with breakfast. 02/24/17   Pyrtle, Lajuan Lines, MD  Misc Natural Products (COLON CARE PO) Take 1 tablet by mouth daily.      [provider]  Multiple Vitamins-Minerals (CENTRUM SILVER PO) Take 1 tablet by mouth daily.      [provider]  pantoprazole (PROTONIX) 40 MG tablet Take 1 tablet (40 mg total) by mouth daily. 02/24/17   Pyrtle, Lajuan Lines, MD  ranitidine (ZANTAC) 150 MG tablet Take 1 tablet (150 mg total) by mouth at bedtime. 02/24/17   Pyrtle, Lajuan Lines, MD  traMADol (ULTRAM) 50 MG tablet Take 1 tablet (50 mg total) by mouth every 8 (eight) hours as  needed. 06/14/17   Dorie Rank, MD  vitamin C (ASCORBIC ACID) 500 MG tablet Take 500 mg by mouth daily.      [provider]    Family History Family History  Problem Relation Age of Onset  . Thyroid cancer Brother        mets to brain  . Diabetes Mother   . Diabetes Sister   . Colon cancer Neg Hx     Social History Social History  Substance Use Topics  . Smoking status: Former Research scientist (life sciences)  . Smokeless tobacco: Never Used  . Alcohol use No     Allergies   Codeine   Review of Systems Review of Systems  All other systems reviewed and are negative.    Physical Exam Updated Vital Signs BP 131/83   Pulse 87   Temp 97.9 F (36.6 C) (Oral)   Resp 18   Ht 1.524 m (5')   Wt 62.6 kg (138 lb)   SpO2 97%   BMI 26.95  kg/m   Physical Exam  Constitutional: She appears well-developed and well-nourished. No distress.  HENT:  Head: Normocephalic and atraumatic.  Right Ear: External ear normal.  Left Ear: External ear normal.  Eyes: Conjunctivae are normal. Right eye exhibits no discharge. Left eye exhibits no discharge. No scleral icterus.  Neck: Neck supple. No tracheal deviation present.  Cardiovascular: Normal rate.   Pulmonary/Chest: Effort normal. No stridor. No respiratory distress.  Abdominal: She exhibits no distension.  Musculoskeletal: She exhibits no edema.       Left hip: She exhibits tenderness. She exhibits no swelling, no deformity and no laceration.       Left knee: She exhibits normal range of motion, no swelling, no effusion and no erythema. No medial joint line and no lateral joint line tenderness noted.       Lumbar back: Normal.  Neurological: She is alert. Cranial nerve deficit: no gross deficits.  Skin: Skin is warm and dry. No rash noted.  Psychiatric: She has a normal mood and affect.  Nursing note and vitals reviewed.    ED Treatments / Results   Radiology Dg Hip Unilat With Pelvis 2-3 Views Left  Result Date: 06/14/2017 CLINICAL DATA:  Left leg pain shooting from hip to knee. EXAM: DG HIP (WITH OR WITHOUT PELVIS) 2-3V LEFT COMPARISON:  None. FINDINGS: Bilateral hip osteoarthritis, with axial joint narrowing and marginal spurring. No fracture deformity or evidence of bone lesion. Osteopenia. L4-5 degenerative disc narrowing. Subcentimeter central pelvic calcification, often from fibroid. IMPRESSION: 1. No acute finding. 2. Osteoarthritis with joint narrowing and marginal spurring. Electronically Signed   By: Monte Fantasia M.D.   On: 06/14/2017 09:17    Procedures Procedures (including critical care time)  Medications Ordered in ED Medications  traMADol (ULTRAM) tablet 50 mg (not administered)     Initial Impression / Assessment and Plan / ED Course  I have reviewed  the triage vital signs and the nursing notes.  Pertinent labs & imaging results that were available during my care of the patient were reviewed by me and considered in my medical decision making (see chart for details).   the patient's hip x-ray shows significant osteoarthritis. I think this is the cause of her left hip pain. I do not see any signs to suggest infection. There is no evidence to suggest any vascular issue.  I doubt sciatica or or other radicular pain. Plan on discharge home with prescription for Ultram. She can also continue her ibuprofen. I  have ordered a walker to help her rest her hip. I also suggested outpatient follow-up with an orthopedic doctor.  Final Clinical Impressions(s) / ED Diagnoses   Final diagnoses:  Hip arthritis    New Prescriptions New Prescriptions   TRAMADOL (ULTRAM) 50 MG TABLET    Take 1 tablet (50 mg total) by mouth every 8 (eight) hours as needed.     Dorie Rank, MD 06/14/17 [phone]

## 2017-06-16 ENCOUNTER — Telehealth (INDEPENDENT_AMBULATORY_CARE_PROVIDER_SITE_OTHER): Payer: Self-pay | Admitting: Orthopaedic Surgery

## 2017-06-16 NOTE — Telephone Encounter (Signed)
error 

## 2017-06-17 ENCOUNTER — Ambulatory Visit (INDEPENDENT_AMBULATORY_CARE_PROVIDER_SITE_OTHER): Payer: Medicare HMO | Admitting: Orthopedic Surgery

## 2017-06-17 ENCOUNTER — Encounter (INDEPENDENT_AMBULATORY_CARE_PROVIDER_SITE_OTHER): Payer: Self-pay | Admitting: Orthopedic Surgery

## 2017-06-17 VITALS — Ht 60.0 in | Wt 138.0 lb

## 2017-06-17 DIAGNOSIS — M1612 Unilateral primary osteoarthritis, left hip: Secondary | ICD-10-CM

## 2017-06-17 NOTE — Progress Notes (Signed)
Office Visit Note   Patient: Erica Carter           Date of Birth: 1935-01-25           MRN: [id number] Visit Date: 06/17/2017              Requested by: [name], MD [address] [place], [place] [address] PCP: [name], MD  Chief Complaint  Patient presents with  . Left Hip - Pain      HPI: Patient is an 82 year old woman who has been having mechanical symptoms of her left hip with catching and giving way. Patient has increasing pain with walking and occasionally has some pain over the lateral aspect the left leg with walking. She is currently using a walker. Patient states the pain is primarily posteriorly and laterally to the left hip.  Assessment & Plan: Visit Diagnoses:  1. Unilateral primary osteoarthritis, left hip     Plan: We'll have patient follow-up with Dr. Ninfa Linden next week. Stressed the surgery for total hip arthroplasty discussed postoperative treatment. Patient states she understands and will follow-up next week.  Follow-Up Instructions: Return in about 1 week (around 06/24/2017).   Ortho Exam  Patient is alert, oriented, no adenopathy, well-dressed, normal affect, normal respiratory effort. On examination patient has an antalgic gait with an adductor lurch on the left. Patient uses a walker. On examination she is external rotation of 10 internal rotation of 20. Review of the radiographs shows osteoarthritis of the left hip with joint space narrowing subcondylar sclerosis with periarticular bony spurs.  Imaging: No results found.  Labs: No results found for: HGBA1C, ESRSEDRATE, CRP, LABURIC, REPTSTATUS, GRAMSTAIN, CULT, LABORGA  Orders:  No orders of the defined types were placed in this encounter.  No orders of the defined types were placed in this encounter.    Procedures: No procedures performed  Clinical Data: No additional findings.  ROS:  All other systems negative, except as noted in the HPI. Review of  Systems  Objective: Vital Signs: Ht 5' (1.524 m)   Wt 138 lb (62.6 kg)   BMI 26.95 kg/m   Specialty Comments:  No specialty comments available.  PMFS History: Patient Active Problem List   Diagnosis Date Noted  . Unilateral primary osteoarthritis, left hip 06/17/2017  . History of gastroesophageal reflux (GERD) 10/26/2011  . GASTRITIS, ACUTE W/O HEMORRHAGE 09/11/2010  . HYPERLIPIDEMIA 02/12/2008  . HYPERTENSION 02/12/2008  . GERD 02/12/2008  . 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 HIATAL HERNIA 05/17/2006  . COLITIS, ULCERATIVE 05/17/2006  . DIVERTICULOSIS, COLON 05/17/2006   Past Medical History:  Diagnosis Date  . Acute gastritis without mention of hemorrhage   . Diverticulosis of colon (without mention of hemorrhage)   . Esophageal reflux   . Hiatal hernia   . Other and unspecified hyperlipidemia   . Personal history of colonic polyps 09/11/2010   hyperplasic  . Stricture and stenosis of esophagus   . Ulcerative colitis, unspecified   . Unspecified essential hypertension     Family History  Problem Relation Age of Onset  . Thyroid cancer Brother        mets to brain  . Diabetes Mother   . Diabetes Sister   . Colon cancer Neg Hx     Past Surgical History:  Procedure Laterality Date  . CATARACT EXTRACTION    . NASAL SINUS SURGERY    . THUMB ARTHROSCOPY    . TUBAL LIGATION     Social History  Occupational History  . Not on file.   Social History Main Topics  . Smoking status: Former Research scientist (life sciences)  . Smokeless tobacco: Never Used  . Alcohol use No  . Drug use: No  . Sexual activity: Not on file

## 2017-06-20 ENCOUNTER — Telehealth (INDEPENDENT_AMBULATORY_CARE_PROVIDER_SITE_OTHER): Payer: Self-pay

## 2017-06-20 ENCOUNTER — Ambulatory Visit (INDEPENDENT_AMBULATORY_CARE_PROVIDER_SITE_OTHER): Payer: Medicare HMO | Admitting: Orthopaedic Surgery

## 2017-06-20 ENCOUNTER — Ambulatory Visit (INDEPENDENT_AMBULATORY_CARE_PROVIDER_SITE_OTHER): Payer: Medicare HMO

## 2017-06-20 ENCOUNTER — Telehealth (INDEPENDENT_AMBULATORY_CARE_PROVIDER_SITE_OTHER): Payer: Self-pay | Admitting: Orthopaedic Surgery

## 2017-06-20 DIAGNOSIS — G8929 Other chronic pain: Secondary | ICD-10-CM | POA: Diagnosis not present

## 2017-06-20 DIAGNOSIS — M545 Low back pain: Secondary | ICD-10-CM

## 2017-06-20 MED ORDER — METHYLPREDNISOLONE 4 MG PO TABS: ORAL_TABLET | ORAL | 0 refills | Status: DC

## 2017-06-20 MED ORDER — TIZANIDINE HCL 4 MG PO TABS: 4.0000 mg | ORAL_TABLET | Freq: Three times a day (TID) | ORAL | 0 refills | Status: DC | PRN

## 2017-06-20 MED ORDER — TRAMADOL HCL 50 MG PO TABS: 100.0000 mg | ORAL_TABLET | Freq: Three times a day (TID) | ORAL | 0 refills | Status: DC | PRN

## 2017-06-20 NOTE — Telephone Encounter (Signed)
Please check her chart to see [place] and then both a steroid taper in Zanaflex to her pharmacy

## 2017-06-20 NOTE — Telephone Encounter (Signed)
Yes this was completed already

## 2017-06-20 NOTE — Telephone Encounter (Signed)
Patient called asking for her steroid medication and muscle relaxer. CB # [phone]

## 2017-06-20 NOTE — Telephone Encounter (Signed)
    Patient called asking for her steroid medication and muscle relaxer. CB # [phone]

## 2017-06-20 NOTE — Progress Notes (Signed)
Office Visit Note   Patient: Erica Carter           Date of Birth: Sep 17, 1935           MRN: [id number] Visit Date: 06/20/2017              Requested by: [name], MD [address] [place], [place] [address] PCP: [name], MD   Assessment & Plan: Visit Diagnoses:  1. Chronic bilateral low back pain, with sciatica presence unspecified     Plan: Based on her x-rays and conical exam this is not a left hip issue at all and I would not recommend a hip replacement because this would not take care of the pain that she is having in her posterior pelvis and sciatic region.Given her clinical exam and the severe findings of significant disease in her lumbar spine and MRI is warranted to assess for nerve impingement syndrome that intervention can hopefully be provider that will decrease her pain. She'll continue her walker for now. I'll send in a muscle relaxant and six-day steroid taper as well. All questions were encouraged and answered.  Follow-Up Instructions: Return in about 2 weeks (around 07/04/2017).   Orders:  Orders Placed This Encounter  Procedures  . XR Lumbar Spine 2-3 Views   Meds ordered this encounter  Medications  . methylPREDNISolone (MEDROL) 4 MG tablet    Sig: Medrol dose pack. Take as instructed    Dispense:  21 tablet    Refill:  0  . tiZANidine (ZANAFLEX) 4 MG tablet    Sig: Take 1 tablet (4 mg total) by mouth every 8 (eight) hours as needed for muscle spasms.    Dispense:  60 tablet    Refill:  0  . traMADol (ULTRAM) 50 MG tablet    Sig: Take 2 tablets (100 mg total) by mouth every 8 (eight) hours as needed.    Dispense:  60 tablet    Refill:  0      Procedures: No procedures performed   Clinical Data: No additional findings.   Subjective: No chief complaint on file. The patient comes to me with chief complaint of "hip pain". However she points to the back of her left side of her pelvis and her lumbar spine and sciatic  areas a source for pain. She denies any pain in the groin at all. She does hurt down the side of her leg on the left side as well. She's had x-rays of her pelvis on the system from interview was told she had arthritis in her left hip and was sent to me to consider a hip replacement. However again she denies any pain in the groin at all. She is able to walk her pain is been quite severe. Is been slowly getting worse as well. Again she points to her back in her SI joint area back of her pelvis as a source of her pain.  HPI  Review of Systems She denies any headache, chest pain, shortness of breath, fever, chills, nausea, vomiting.  Objective: Vital Signs: There were no vitals taken for this visit.  Physical Exam She is alert and with 3 and in no acute distress but does ambulate with a walker. Ortho Exam  Her left hip exam is entirely normal. I can easily put her left hip through full internal and external rotation with no pain in the groin at all and no blocks rotation. Her pain is only at the posterior pelvis at the  SI joint sciatic region. Her knee foot and ankle exam are normal. She does have subjective numbness and tingling down the L4-L5 area.  Specialty Comments:  No specialty comments available.  Imaging: Xr Lumbar Spine 2-3 Views  Result Date: 06/20/2017 AP and lateral of her lumbar spine show severe arthritic changes throughout the lumbar spine with significant degenerative scoliosis as well.  I independently reviewed x-rays of her pelvis and left hip and see only minimal arthritic changes. Her hip joint is still very well located. I see no evidence of fracture.  PMFS History: Patient Active Problem List   Diagnosis Date Noted  . Unilateral primary osteoarthritis, left hip 06/17/2017  . History of gastroesophageal reflux (GERD) 10/26/2011  . GASTRITIS, ACUTE W/O HEMORRHAGE 09/11/2010  . HYPERLIPIDEMIA 02/12/2008  . HYPERTENSION 02/12/2008  . GERD 02/12/2008  . SCHATZKI'S  RING 05/17/2006  . HIATAL HERNIA 05/17/2006  . COLITIS, ULCERATIVE 05/17/2006  . DIVERTICULOSIS, COLON 05/17/2006   Past Medical History:  Diagnosis Date  . Acute gastritis without mention of hemorrhage   . Diverticulosis of colon (without mention of hemorrhage)   . Esophageal reflux   . Hiatal hernia   . Other and unspecified hyperlipidemia   . Personal history of colonic polyps 09/11/2010   hyperplasic  . Stricture and stenosis of esophagus   . Ulcerative colitis, unspecified   . Unspecified essential hypertension     Family History  Problem Relation Age of Onset  . Thyroid cancer Brother        mets to brain  . Diabetes Mother   . Diabetes Sister   . Colon cancer Neg Hx     Past Surgical History:  Procedure Laterality Date  . CATARACT EXTRACTION    . NASAL SINUS SURGERY    . THUMB ARTHROSCOPY    . TUBAL LIGATION     Social History   Occupational History  . Not on file.   Social History Main Topics  . Smoking status: Former Research scientist (life sciences)  . Smokeless tobacco: Never Used  . Alcohol use No  . Drug use: No  . Sexual activity: Not on file

## 2017-06-21 ENCOUNTER — Other Ambulatory Visit (INDEPENDENT_AMBULATORY_CARE_PROVIDER_SITE_OTHER): Payer: Self-pay

## 2017-06-21 DIAGNOSIS — M4807 Spinal stenosis, lumbosacral region: Secondary | ICD-10-CM

## 2017-06-22 DIAGNOSIS — D692 Other nonthrombocytopenic purpura: Secondary | ICD-10-CM | POA: Diagnosis not present

## 2017-06-22 DIAGNOSIS — Z0000 Encounter for general adult medical examination without abnormal findings: Secondary | ICD-10-CM | POA: Diagnosis not present

## 2017-06-22 DIAGNOSIS — D519 Vitamin B12 deficiency anemia, unspecified: Secondary | ICD-10-CM | POA: Diagnosis not present

## 2017-06-22 DIAGNOSIS — E782 Mixed hyperlipidemia: Secondary | ICD-10-CM | POA: Diagnosis not present

## 2017-06-22 DIAGNOSIS — E1122 Type 2 diabetes mellitus with diabetic chronic kidney disease: Secondary | ICD-10-CM | POA: Diagnosis not present

## 2017-06-22 DIAGNOSIS — K5190 Ulcerative colitis, unspecified, without complications: Secondary | ICD-10-CM | POA: Diagnosis not present

## 2017-06-22 DIAGNOSIS — M549 Dorsalgia, unspecified: Secondary | ICD-10-CM | POA: Diagnosis not present

## 2017-06-22 DIAGNOSIS — N183 Chronic kidney disease, stage 3 (moderate): Secondary | ICD-10-CM | POA: Diagnosis not present

## 2017-06-22 DIAGNOSIS — I129 Hypertensive chronic kidney disease with stage 1 through stage 4 chronic kidney disease, or unspecified chronic kidney disease: Secondary | ICD-10-CM | POA: Diagnosis not present

## 2017-06-22 DIAGNOSIS — M810 Age-related osteoporosis without current pathological fracture: Secondary | ICD-10-CM | POA: Diagnosis not present

## 2017-06-23 ENCOUNTER — Ambulatory Visit (INDEPENDENT_AMBULATORY_CARE_PROVIDER_SITE_OTHER): Payer: Medicare HMO | Admitting: Orthopaedic Surgery

## 2017-06-27 ENCOUNTER — Telehealth (INDEPENDENT_AMBULATORY_CARE_PROVIDER_SITE_OTHER): Payer: Self-pay | Admitting: Orthopaedic Surgery

## 2017-06-27 ENCOUNTER — Telehealth (INDEPENDENT_AMBULATORY_CARE_PROVIDER_SITE_OTHER): Payer: Self-pay | Admitting: Radiology

## 2017-06-27 ENCOUNTER — Other Ambulatory Visit (INDEPENDENT_AMBULATORY_CARE_PROVIDER_SITE_OTHER): Payer: Self-pay

## 2017-06-27 MED ORDER — METHYLPREDNISOLONE 4 MG PO TABS: ORAL_TABLET | ORAL | 0 refills | Status: DC

## 2017-06-27 NOTE — Telephone Encounter (Signed)
Okay to send another Medrol Dosepak for her to her pharmacy

## 2017-06-27 NOTE — Telephone Encounter (Signed)
Patient aware this is been called into her pharmacy

## 2017-06-27 NOTE — Telephone Encounter (Signed)
Patient called and lmom on triage phone that she is having severe leg pain (did not state which leg). Please call patient back to advise.

## 2017-06-27 NOTE — Telephone Encounter (Signed)
See below, looks like we have scheduled an MRI for her. Do you want to see her in office, anything we can do here?

## 2017-06-27 NOTE — Telephone Encounter (Signed)
Please advise 

## 2017-06-27 NOTE — Telephone Encounter (Signed)
Patient called needing Rx refilled (Methylprednisolone) The number to contact patient is [phone]

## 2017-06-27 NOTE — Telephone Encounter (Signed)
She's been having this pain and there is nothing we can do until we see what the MRI shows.

## 2017-07-04 ENCOUNTER — Ambulatory Visit (INDEPENDENT_AMBULATORY_CARE_PROVIDER_SITE_OTHER): Payer: Medicare HMO | Admitting: Orthopaedic Surgery

## 2017-07-06 ENCOUNTER — Ambulatory Visit
Admission: RE | Admit: 2017-07-06 | Discharge: 2017-07-06 | Disposition: A | Discharge status: Home / Self Care | Payer: Medicare HMO | Source: Ambulatory Visit | Attending: Orthopaedic Surgery | Admitting: Orthopaedic Surgery

## 2017-07-06 DIAGNOSIS — M4807 Spinal stenosis, lumbosacral region: Secondary | ICD-10-CM

## 2017-07-06 DIAGNOSIS — M545 Low back pain: Secondary | ICD-10-CM | POA: Diagnosis not present

## 2017-07-07 ENCOUNTER — Ambulatory Visit (INDEPENDENT_AMBULATORY_CARE_PROVIDER_SITE_OTHER): Payer: Medicare HMO | Admitting: Orthopaedic Surgery

## 2017-07-07 DIAGNOSIS — M5442 Lumbago with sciatica, left side: Secondary | ICD-10-CM

## 2017-07-07 DIAGNOSIS — G8929 Other chronic pain: Secondary | ICD-10-CM

## 2017-07-07 NOTE — Progress Notes (Signed)
The patient is a 82 year old female who was originally sent to me by one of my partners to evaluate her for a left hip replacement. However her pain some new be more sciatic region rating down her leg elevated to her foot. She denies any groin pain and on my exam she has no pain in her hip with full rotation and x-rays showed only minimal arthritic changes. She still describes radicular type of pain service and her for an MRI of her lumbar spine based on my physical exam and essentially normal left hip. She still has pain and she points the lateral aspect of her left leg when she walks around it does radiate into her sciatic region. She still has a groin pain.  On exam I can still easily put her through full internal/external rotation of her left hip without any pain at all in the groin and full stressing of the hip and compression hip causes no pain in the hip joint. She does still have radicular symptoms on the lateral aspect of her left leg only. She's got good strength in her foot and normal sensation. The MRI is reviewed of her lumbar spine it does show disc protrusions at several levels and it doesn't feel that there is significant disease at L3-L4 that is displacing the L4 nerve root. She also has some foraminal stenosis at L5-S1 but more of her symptoms seem to involve the L4 nerve.  I showed her the MRI report and went over this in detail and feel that she would benefit from referral to Dr. Ernestina Patches to consider a left L4 injection. We will work on setting that appointment up. We'll have her follow back to see me in 4 weeks and hopefully the interim she is had an injection in her back.

## 2017-07-08 ENCOUNTER — Other Ambulatory Visit (INDEPENDENT_AMBULATORY_CARE_PROVIDER_SITE_OTHER): Payer: Self-pay

## 2017-07-08 DIAGNOSIS — M5442 Lumbago with sciatica, left side: Secondary | ICD-10-CM

## 2017-07-12 ENCOUNTER — Emergency Department (HOSPITAL_COMMUNITY)
Admission: EM | Admit: 2017-07-12 | Discharge: 2017-07-12 | Disposition: A | Discharge status: Home / Self Care | Payer: Medicare HMO | Attending: Emergency Medicine | Admitting: Emergency Medicine

## 2017-07-12 ENCOUNTER — Emergency Department (HOSPITAL_COMMUNITY): Payer: Medicare HMO

## 2017-07-12 ENCOUNTER — Encounter (HOSPITAL_COMMUNITY): Payer: Self-pay

## 2017-07-12 DIAGNOSIS — I10 Essential (primary) hypertension: Secondary | ICD-10-CM | POA: Diagnosis not present

## 2017-07-12 DIAGNOSIS — R404 Transient alteration of awareness: Secondary | ICD-10-CM | POA: Diagnosis not present

## 2017-07-12 DIAGNOSIS — R55 Syncope and collapse: Secondary | ICD-10-CM

## 2017-07-12 DIAGNOSIS — Z7982 Long term (current) use of aspirin: Secondary | ICD-10-CM | POA: Insufficient documentation

## 2017-07-12 DIAGNOSIS — Z87891 Personal history of nicotine dependence: Secondary | ICD-10-CM | POA: Diagnosis not present

## 2017-07-12 DIAGNOSIS — R42 Dizziness and giddiness: Secondary | ICD-10-CM | POA: Diagnosis not present

## 2017-07-12 DIAGNOSIS — Z79899 Other long term (current) drug therapy: Secondary | ICD-10-CM | POA: Diagnosis not present

## 2017-07-12 DIAGNOSIS — I959 Hypotension, unspecified: Secondary | ICD-10-CM | POA: Diagnosis not present

## 2017-07-12 DIAGNOSIS — J9811 Atelectasis: Secondary | ICD-10-CM | POA: Diagnosis not present

## 2017-07-12 LAB — CBC WITH DIFFERENTIAL/PLATELET
BASOS ABS: 0 10*3/uL (ref 0.0–0.1)
BASOS PCT: 0 %
Eosinophils Absolute: 0.1 10*3/uL (ref 0.0–0.7)
Eosinophils Relative: 2 %
HEMATOCRIT: 37.2 % (ref 36.0–46.0)
HEMOGLOBIN: 12.2 g/dL (ref 12.0–15.0)
Lymphocytes Relative: 20 %
Lymphs Abs: 1.5 10*3/uL (ref 0.7–4.0)
MCH: 28.6 pg (ref 26.0–34.0)
MCHC: 32.8 g/dL (ref 30.0–36.0)
MCV: 87.1 fL (ref 78.0–100.0)
Monocytes Absolute: 0.5 10*3/uL (ref 0.1–1.0)
Monocytes Relative: 7 %
NEUTROS ABS: 5.1 10*3/uL (ref 1.7–7.7)
Neutrophils Relative %: 71 %
Platelets: 221 10*3/uL (ref 150–400)
RBC: 4.27 MIL/uL (ref 3.87–5.11)
RDW: 13.7 % (ref 11.5–15.5)
WBC: 7.2 10*3/uL (ref 4.0–10.5)

## 2017-07-12 LAB — COMPREHENSIVE METABOLIC PANEL
ALK PHOS: 62 U/L (ref 38–126)
ALT: 15 U/L (ref 14–54)
ANION GAP: 6 (ref 5–15)
AST: 25 U/L (ref 15–41)
Albumin: 3 g/dL — ABNORMAL LOW (ref 3.5–5.0)
BILIRUBIN TOTAL: 0.8 mg/dL (ref 0.3–1.2)
BUN: 16 mg/dL (ref 6–20)
CALCIUM: 8.1 mg/dL — AB (ref 8.9–10.3)
CO2: 25 mmol/L (ref 22–32)
Chloride: 110 mmol/L (ref 101–111)
Creatinine, Ser: 1.01 mg/dL — ABNORMAL HIGH (ref 0.44–1.00)
GFR calc non Af Amer: 51 mL/min — ABNORMAL LOW (ref >60)
GFR, EST AFRICAN AMERICAN: 59 mL/min — AB (ref >60)
Glucose, Bld: 128 mg/dL — ABNORMAL HIGH (ref 65–99)
Potassium: 4.3 mmol/L (ref 3.5–5.1)
SODIUM: 141 mmol/L (ref 135–145)
TOTAL PROTEIN: 5.3 g/dL — AB (ref 6.5–8.1)

## 2017-07-12 LAB — I-STAT CHEM 8, ED
BUN: 18 mg/dL (ref 6–20)
CALCIUM ION: 1.12 mmol/L — AB (ref 1.15–1.40)
CHLORIDE: 107 mmol/L (ref 101–111)
Creatinine, Ser: 1 mg/dL (ref 0.44–1.00)
Glucose, Bld: 122 mg/dL — ABNORMAL HIGH (ref 65–99)
HCT: 34 % — ABNORMAL LOW (ref 36.0–46.0)
Hemoglobin: 11.6 g/dL — ABNORMAL LOW (ref 12.0–15.0)
POTASSIUM: 4.4 mmol/L (ref 3.5–5.1)
SODIUM: 142 mmol/L (ref 135–145)
TCO2: 27 mmol/L (ref 22–32)

## 2017-07-12 LAB — I-STAT TROPONIN, ED: Troponin i, poc: 0 ng/mL (ref 0.00–0.08)

## 2017-07-12 LAB — I-STAT CG4 LACTIC ACID, ED: LACTIC ACID, VENOUS: 1.18 mmol/L (ref 0.5–1.9)

## 2017-07-12 LAB — D-DIMER, QUANTITATIVE (NOT AT ARMC): D DIMER QUANT: 0.36 ug{FEU}/mL (ref 0.00–0.50)

## 2017-07-12 MED ORDER — SODIUM CHLORIDE 0.9 % IV BOLUS (SEPSIS)
1000.0000 mL | Freq: Once | INTRAVENOUS | Status: AC
  Administered 2017-07-12: 1000 mL via INTRAVENOUS

## 2017-07-12 NOTE — ED Notes (Signed)
EDP at bedside. Aware of BP

## 2017-07-12 NOTE — ED Notes (Signed)
Writer states they completed orthostatic vital signs and when patient stood at bedside she stated "I'm not dizzy, I feel fine". Patient tolerated well.

## 2017-07-12 NOTE — ED Notes (Signed)
Pt given a [place] sand which and a soda.

## 2017-07-12 NOTE — ED Triage Notes (Signed)
Per EMS, patient was initially called for a stroke as patient was dizzy/confused. Negative stroke screen. Patient initial manual BP 60/40 with a weak radial pulse, upon standing patient almost passed out and no palpable radial pulse or BP. No LOC. No abdominal pain, lungs clear, 500cc bolus given, BP 130/70, maintained for 15 minutes and dropped to 60/42 upon arrival.

## 2017-07-12 NOTE — ED Notes (Signed)
Bed: WA17 Expected date:  Expected time:  Means of arrival:  Comments: EMS- elderly, hypotension

## 2017-07-12 NOTE — ED Provider Notes (Signed)
[facility] DEPT Provider Note   CSN: [id number] Arrival date & time: 07/12/17  1339     History   Chief Complaint Chief Complaint  Patient presents with  . Hypotension  . Dizziness    HPI Erica Carter is a 82 y.o. female.  The history is provided by the patient. No language interpreter was used.  Dizziness   Erica Carter is a 82 y.o. female who presents to the Emergency Department complaining of dizziness.  She presents via EMS for evaluation of dizziness. She was in her routine stiff health when she was unloading groceries from the car when she began to feel significant dizziness and lightheadedness. She felt like she was going to fall. On EMS arrival she had a blood pressure 60/40 with weak radial pulse. She denies any fevers, chest pain, shortness of breath, no headache, nausea, vomiting, leg swelling or pain. No prior similar symptoms. Past Medical History:  Diagnosis Date  . Acute gastritis without mention of hemorrhage   . Diverticulosis of colon (without mention of hemorrhage)   . Esophageal reflux   . Hiatal hernia   . Other and unspecified hyperlipidemia   . Personal history of colonic polyps 09/11/2010   hyperplasic  . Stricture and stenosis of esophagus   . Ulcerative colitis, unspecified   . Unspecified essential hypertension     Patient Active Problem List   Diagnosis Date Noted  . Unilateral primary osteoarthritis, left hip 06/17/2017  . History of gastroesophageal reflux (GERD) 10/26/2011  . GASTRITIS, ACUTE W/O HEMORRHAGE 09/11/2010  . HYPERLIPIDEMIA 02/12/2008  . HYPERTENSION 02/12/2008  . GERD 02/12/2008  . 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 HIATAL HERNIA 05/17/2006  . COLITIS, ULCERATIVE 05/17/2006  . DIVERTICULOSIS, COLON 05/17/2006    Past Surgical History:  Procedure Laterality Date  . CATARACT EXTRACTION    . NASAL SINUS SURGERY    . THUMB ARTHROSCOPY    . TUBAL LIGATION      OB History    No data available       Home  Medications    Prior to Admission medications   Medication Sig Start Date End Date Taking? Authorizing Provider  amLODipine (NORVASC) 10 MG tablet Take 10 mg by mouth daily.  11/18/14  Yes [provider]  aspirin 81 MG tablet Take 81 mg by mouth daily.   Yes [provider]  atorvastatin (LIPITOR) 20 MG tablet Take 20 mg by mouth daily.    Yes [provider]  calcium gluconate 500 MG tablet Take 500 mg by mouth daily.     Yes [provider]  cholecalciferol (VITAMIN D) 1000 UNITS tablet Take 1,000 Units by mouth daily.     Yes [provider]  Cyanocobalamin (VITAMIN B-12 IJ) Inject as directed. Pt gets shot once a month   Yes [provider]  ibuprofen (ADVIL,MOTRIN) 200 MG tablet Take 400 mg by mouth every 6 (six) hours as needed for moderate pain.   Yes [provider]  LORazepam (ATIVAN) 1 MG tablet Take 1 mg by mouth 2 (two) times daily. 07/05/17  Yes [provider]  losartan (COZAAR) 100 MG tablet Take 100 mg by mouth daily.     Yes [provider]  mesalamine (LIALDA) 1.2 g EC tablet Take 2 tablets (2.4 g total) by mouth daily with breakfast. 02/24/17  Yes Pyrtle, Lajuan Lines, MD  Misc Natural Products (COLON CARE PO) Take 1 tablet by mouth daily.     Yes [provider]  Multiple Vitamins-Minerals (CENTRUM SILVER PO) Take 1 tablet by mouth daily.     Yes [provider]  pantoprazole (PROTONIX) 40 MG tablet Take 1 tablet (40 mg total) by mouth daily. 02/24/17  Yes Pyrtle, Lajuan Lines, MD  ranitidine (ZANTAC) 150 MG tablet Take 1 tablet (150 mg total) by mouth at bedtime. 02/24/17  Yes Pyrtle, Lajuan Lines, MD  traMADol (ULTRAM) 50 MG tablet Take 2 tablets (100 mg total) by mouth every 8 (eight) hours as needed. 06/20/17  Yes Mcarthur Rossetti, MD  vitamin C (ASCORBIC ACID) 500 MG tablet Take 500 mg by mouth daily.     Yes [provider]  methylPREDNISolone (MEDROL) 4 MG tablet Medrol dose pack.  Take as instructed Patient not taking: Reported on 07/12/2017 06/27/17   Mcarthur Rossetti, MD  tiZANidine (ZANAFLEX) 4 MG tablet Take 1 tablet (4 mg total) by mouth every 8 (eight) hours as needed for muscle spasms. Patient not taking: Reported on 07/12/2017 06/20/17   Mcarthur Rossetti, MD    Family History Family History  Problem Relation Age of Onset  . Thyroid cancer Brother        mets to brain  . Diabetes Mother   . Diabetes Sister   . Colon cancer Neg Hx     Social History Social History  Substance Use Topics  . Smoking status: Former Research scientist (life sciences)  . Smokeless tobacco: Never Used  . Alcohol use No     Allergies   Codeine   Review of Systems Review of Systems  Neurological: Positive for dizziness.  All other systems reviewed and are negative.    Physical Exam Updated Vital Signs BP (!) 141/85 (BP Location: Left Arm)   Pulse 77   Temp 97.6 F (36.4 C) (Rectal)   Resp 18   Ht 5' (1.524 m)   Wt 60.8 kg (134 lb)   SpO2 100%   BMI 26.17 kg/m   Physical Exam  Constitutional: She is oriented to person, place, and time. She appears well-developed and well-nourished.  HENT:  Head: Normocephalic and atraumatic.  Cardiovascular: Normal rate and regular rhythm.   Murmur heard. Pulmonary/Chest: Effort normal and breath sounds normal. No respiratory distress.  Abdominal: Soft. There is no tenderness. There is no rebound and no guarding.  Musculoskeletal: She exhibits no edema or tenderness.  Neurological: She is alert and oriented to person, place, and time.  Skin: Skin is warm and dry.  Psychiatric: She has a normal mood and affect. Her behavior is normal.  Nursing note and vitals reviewed.    ED Treatments / Results  Labs (all labs ordered are listed, but only abnormal results are displayed) Labs Reviewed  COMPREHENSIVE METABOLIC PANEL - Abnormal; Notable for the following:       Result Value   Glucose, Bld 128 (*)    Creatinine, Ser 1.01 (*)     Calcium 8.1 (*)    Total Protein 5.3 (*)    Albumin 3.0 (*)    GFR calc non Af Amer 51 (*)    GFR calc Af Amer 59 (*)    All other components within normal limits  I-STAT CHEM 8, ED - Abnormal; Notable for the following:    Glucose, Bld 122 (*)    Calcium, Ion 1.12 (*)    Hemoglobin 11.6 (*)    HCT 34.0 (*)    All other components within normal limits  CBC WITH DIFFERENTIAL/PLATELET  D-DIMER, QUANTITATIVE (NOT AT [facility])  I-STAT TROPONIN, ED  I-STAT  CG4 LACTIC ACID, ED    EKG  EKG Interpretation  Date/Time:  Tuesday July 12 2017 13:53:14 EDT Ventricular Rate:  62 PR Interval:    QRS Duration: 116 QT Interval:  479 QTC Calculation: 487 R Axis:   -27 Text Interpretation:  Sinus rhythm Anterior infarct, old Confirmed by Quintella Reichert [phone]) on 07/12/2017 2:31:43 PM       Radiology Dg Chest 2 View  Result Date: 07/12/2017 CLINICAL DATA:  Dizziness and hypertension. EXAM: CHEST  2 VIEW COMPARISON:  January 06, 2016. FINDINGS: The cardiomediastinal silhouette is normal in size. Normal pulmonary vascularity. Bibasilar atelectasis. No focal consolidation, pleural effusion, or pneumothorax. Persistent elevation of the right hemidiaphragm. No acute osseous abnormality. IMPRESSION: No active cardiopulmonary disease. Electronically Signed   By: Titus Dubin M.D.   On: 07/12/2017 15:38    Procedures Procedures (including critical care time) EMERGENCY DEPARTMENT ULTRASOUND  Study: Limited Retroperitoneal Ultrasound of the Abdominal Aorta.  INDICATIONS:Abnormal vital signs Multiple views of the abdominal aorta were obtained in real-time from the diaphragmatic hiatus to the aortic bifurcation in transverse planes with a multi-frequency probe.  PERFORMED BY: Myself IMAGES ARCHIVED?: Yes LIMITATIONS:  Bowel gas INTERPRETATION:  No abdominal aortic aneurysm     Medications Ordered in ED Medications  sodium chloride 0.9 % bolus 1,000 mL (0 mLs Intravenous Stopped 07/12/17  1538)  sodium chloride 0.9 % bolus 1,000 mL (1,000 mLs Intravenous New Bag/Given 07/12/17 1509)     Initial Impression / Assessment and Plan / ED Course  I have reviewed the triage vital signs and the nursing notes.  Pertinent labs & imaging results that were available during my care of the patient were reviewed by me and considered in my medical decision making (see chart for details).    Patient with history of hypertension here for evaluation of dizziness with near syncopal episode and hypotension for EMS. On repeat assessment in the emergency department she is feeling improved with no recurrent lightheadedness. Presentation is not consistent with ACS, PE, stroke, sepsis Discussed with patient home care for hypotensive episode, question dehydration. Discussed importance of PCP follow-up. Will decrease her amlodipine dose. Discussed close return precautions.  Final Clinical Impressions(s) / ED Diagnoses   Final diagnoses:  Near syncope  Hypotensive episode    New Prescriptions New Prescriptions   No medications on file     Quintella Reichert, MD 07/12/17 [phone]

## 2017-07-12 NOTE — Discharge Instructions (Signed)
Your blood pressure was very low when you came to the emergency department today. Decrease your amlodipine (Norvasc) dose and only take a half tablet daily.  Please follow up closely with your doctor for recheck. Your kidney function (creatinine) was slightly elevated at 1 today. This will need to be followed up by your family doctor. Get rechecked immediately if you develop any new or concerning symptoms such as fever, weakness, chest pain, difficulty breathing.

## 2017-07-20 DIAGNOSIS — E538 Deficiency of other specified B group vitamins: Secondary | ICD-10-CM | POA: Diagnosis not present

## 2017-07-22 DIAGNOSIS — Z23 Encounter for immunization: Secondary | ICD-10-CM | POA: Diagnosis not present

## 2017-07-22 DIAGNOSIS — R55 Syncope and collapse: Secondary | ICD-10-CM | POA: Diagnosis not present

## 2017-07-22 DIAGNOSIS — I959 Hypotension, unspecified: Secondary | ICD-10-CM | POA: Diagnosis not present

## 2017-07-25 ENCOUNTER — Ambulatory Visit (INDEPENDENT_AMBULATORY_CARE_PROVIDER_SITE_OTHER): Payer: Medicare HMO

## 2017-07-25 ENCOUNTER — Ambulatory Visit (INDEPENDENT_AMBULATORY_CARE_PROVIDER_SITE_OTHER): Payer: Medicare HMO | Admitting: Physical Medicine and Rehabilitation

## 2017-07-25 ENCOUNTER — Encounter (INDEPENDENT_AMBULATORY_CARE_PROVIDER_SITE_OTHER): Payer: Self-pay | Admitting: Physical Medicine and Rehabilitation

## 2017-07-25 VITALS — BP 133/80 | HR 90

## 2017-07-25 DIAGNOSIS — M5116 Intervertebral disc disorders with radiculopathy, lumbar region: Secondary | ICD-10-CM

## 2017-07-25 DIAGNOSIS — M5416 Radiculopathy, lumbar region: Secondary | ICD-10-CM

## 2017-07-25 MED ORDER — LIDOCAINE HCL (PF) 1 % IJ SOLN
2.0000 mL | Freq: Once | INTRAMUSCULAR | Status: AC
  Administered 2017-07-25: 2 mL

## 2017-07-25 MED ORDER — BETAMETHASONE SOD PHOS & ACET 6 (3-3) MG/ML IJ SUSP
12.0000 mg | Freq: Once | INTRAMUSCULAR | Status: AC
  Administered 2017-07-25: 12 mg

## 2017-07-25 NOTE — Patient Instructions (Signed)

## 2017-07-25 NOTE — Progress Notes (Deleted)
Left side low back pain since 06/11/17. No injury. Pain down left leg to knee.Worse with walking. No numbness or tingling. States pain has been better for the last week. Still sleeping on couch, but leg and hip pain better.

## 2017-07-29 NOTE — Procedures (Signed)
Erica Carter is an 82 year old female followed by Dr. Ninfa Linden in our office. She's had chronic worsening left-sided low back pain since 06/11/2017 without injury. She is having pain down the left leg to the knee at L3-L4 distribution. An MRI was obtained of the lumbar spine reviewed below.The injection  will be diagnostic and hopefully therapeutic. The patient has failed conservative care including time, medications and activity modification.  Lumbosacral Transforaminal Epidural Steroid Injection - Sub-Pedicular Approach with Fluoroscopic Guidance  Patient: Erica Carter      Date of Birth: Feb 24, 1935 MRN: [id number] PCP: [name], MD      Visit Date: 07/25/2017   Universal Protocol:    Date/Time: 07/25/2017  Consent Given By: the patient  Position: PRONE  Additional Comments: Vital signs were monitored before and after the procedure. Patient was prepped and draped in the usual sterile fashion. The correct patient, procedure, and site was verified.   Injection Procedure Details:  Procedure Site One Meds Administered:  Meds ordered this encounter  Medications  . lidocaine (PF) (XYLOCAINE) 1 % injection 2 mL  . betamethasone acetate-betamethasone sodium phosphate (CELESTONE) injection 12 mg    Laterality: Left  Location/Site:  L3-L4 L4-L5  Needle size: 22 G  Needle type: Spinal  Needle Placement: Transforaminal  Findings:  -Contrast Used: 1 mL iohexol 180 mg iodine/mL   -Comments: Excellent flow of contrast along the nerve and into the epidural space.  Procedure Details: After squaring off the end-plates to get a true AP view, the C-arm was positioned so that an oblique view of the foramen as noted above was visualized. The target area is just inferior to the "nose of the scotty dog" or sub pedicular. The soft tissues overlying this structure were infiltrated with 2-3 ml. of 1% Lidocaine without Epinephrine.  The spinal needle was inserted toward the target using  a "trajectory" view along the fluoroscope beam.  Under AP and lateral visualization, the needle was advanced so it did not puncture dura and was located close the 6 O'Clock position of the pedical in AP tracterory. Biplanar projections were used to confirm position. Aspiration was confirmed to be negative for CSF and/or blood. A 1-2 ml. volume of Isovue-250 was injected and flow of contrast was noted at each level. Radiographs were obtained for documentation purposes.   After attaining the desired flow of contrast documented above, a 0.5 to 1.0 ml test dose of 0.25% Marcaine was injected into each respective transforaminal space.  The patient was observed for 90 seconds post injection.  After Carter sensory deficits were reported, and normal lower extremity motor function was noted,   the above injectate was administered so that equal amounts of the injectate were placed at each foramen (level) into the transforaminal epidural space.   Additional Comments:  The patient tolerated the procedure well Dressing: Band-Aid    Post-procedure details: Patient was observed during the procedure. Post-procedure instructions were reviewed.  Patient left the clinic in stable condition.

## 2017-08-04 ENCOUNTER — Ambulatory Visit (INDEPENDENT_AMBULATORY_CARE_PROVIDER_SITE_OTHER): Payer: Medicare HMO | Admitting: Orthopaedic Surgery

## 2017-08-04 DIAGNOSIS — Z92241 Personal history of systemic steroid therapy: Secondary | ICD-10-CM | POA: Diagnosis not present

## 2017-08-04 NOTE — Progress Notes (Signed)
The patient is following up after I sent her to Dr. Ernestina Patches for an epidural steroid injection. This was done on the left side at L3-L4 and L4-L5. She is having significant back pain and radicular symptoms going down her left leg. Injection was done on 07/25/2017. He says the injection was very helpful. She says her pain is gone and she has no more leg pain. She said when I saw her she was basically having to sit a lot and be in a wheelchair sometimes right now she doesn't need anything like that at all.  On exam she has negative straight leg raise on the left side she has normal sensation and extension strength in her left lower extremity and no significant back pain either.  She has done quite well to the point that she'll follow-up as needed. If she has any problems she will let us know.

## 2017-08-09 DIAGNOSIS — Z961 Presence of intraocular lens: Secondary | ICD-10-CM | POA: Diagnosis not present

## 2017-08-09 DIAGNOSIS — H52203 Unspecified astigmatism, bilateral: Secondary | ICD-10-CM | POA: Diagnosis not present

## 2017-08-11 DIAGNOSIS — M810 Age-related osteoporosis without current pathological fracture: Secondary | ICD-10-CM | POA: Diagnosis not present

## 2017-08-11 DIAGNOSIS — L309 Dermatitis, unspecified: Secondary | ICD-10-CM | POA: Diagnosis not present

## 2017-08-17 DIAGNOSIS — E538 Deficiency of other specified B group vitamins: Secondary | ICD-10-CM | POA: Diagnosis not present

## 2017-09-07 ENCOUNTER — Other Ambulatory Visit: Payer: Self-pay | Admitting: Pharmacist

## 2017-09-07 NOTE — Patient Outreach (Addendum)
Incoming call from Sibyl Parr in response to the [facility] Medication Adherence Campaign. Speak with patient. HIPAA identifiers verified and verbal consent received.  Ms. Bevilacqua reports that she takes her atorvastatin and losartan each once daily as directed. Reports that her amlodipine dose was changed in September. Reports that she had been taking amlodipine 10 mg once daily. Reports that she was seen in the ED for hypotension and that she was instructed to cut her amlodipine tablets in half, taking 5 mg once daily. Patient reports that she let her PCP know that it is difficult for her to cut these tablets in half and that her PCP accordingly changed her prescription instructions for her to take amlodipine 10 mg every other day. Patient reports that she uses a weekly pillbox to take all of her medications and that she has been filling her amlodipine into the pillbox for every other day dosing. Counsel patient on the importance of medication adherence. Ms. Vossler denies any missed doses. Let Ms. Buczek know that a 5 mg strength of amlodipine is available. Let patient know that I will reach out to her PCP to recommend a change of her amlodipine prescription from the every other day dosing to amlodipine 5 mg daily for potential improvement in blood pressure control and improved medication adherence.   Patient reports that she does check her blood pressure at home. Reports that her blood pressures have been good and denies any hypotension. Encourage patient to continue to check her blood pressure, to keep a written record and to bring this record with her to each of her medical appointments. Ms. Mullally states that she will do this.  Patient reports that she does currently use [facility] mail order pharmacy for cost savings for her medications.   Patient denies any further medication questions or concerns. Provide patient with my phone number.  PLAN  Will call patient's PCP to recommend a change of her amlodipine  prescription from amlodipine 10 mg every other day  to amlodipine 5 mg daily for potential improvement in blood pressure control and improved medication adherence.   Harlow Asa, PharmD, [facility] [phone]

## 2017-09-07 NOTE — Patient Outreach (Signed)
Call patient's PCP to recommend a change of her amlodipine prescription from amlodipine 10 mg every other day  to amlodipine 5 mg daily for potential improvement in blood pressure control and improved medication adherence. Leave a message with Jerene Pitch in Dr. Raul Del office.  Harlow Asa, PharmD, [facility] [phone]

## 2017-09-09 ENCOUNTER — Other Ambulatory Visit: Payer: Self-pay | Admitting: Pharmacist

## 2017-09-09 NOTE — Patient Outreach (Signed)
Call to follow up with Erica Carter regarding her amlodipine. Spoke with patient. HIPAA identifiers verified and verbal consent received.  Erica Carter reports that she received a call from Dr. Alexander Bergeron office and that the office did change her amlodipine from amlodipine 10 mg every other day to amlodipine 5 mg daily, as I had recommended for potential improvement in blood pressure control and improved medication adherence.  Erica Carter reports that she has picked up this prescription. Patient denies any further medication questions/concerns at this time.  Harlow Asa, PharmD, [facility] [phone]

## 2017-09-16 DIAGNOSIS — E538 Deficiency of other specified B group vitamins: Secondary | ICD-10-CM | POA: Diagnosis not present

## 2017-09-23 ENCOUNTER — Telehealth: Payer: Self-pay | Admitting: Internal Medicine

## 2017-09-23 NOTE — Telephone Encounter (Signed)
I have attempted to contact patient multiple times on her home phone and was unable to reach her as phone was either busy or was "unable to complete my call." I will attempt to reach her at a later time.

## 2017-09-26 NOTE — Telephone Encounter (Signed)
I have spoken to patient to advise that we have always sent rx for Lialda with okay for generic but [employer] automatically gives name brand. She is changing pharmacies at the beginning of next year and will need generic. I advised to let us know when her new insurance takes effect and when she is changing pharmacies and we will be glad to make sure to indicate that she specifically needs generic mesalamine.

## 2017-10-14 DIAGNOSIS — E538 Deficiency of other specified B group vitamins: Secondary | ICD-10-CM | POA: Diagnosis not present

## 2017-10-19 DIAGNOSIS — Z1231 Encounter for screening mammogram for malignant neoplasm of breast: Secondary | ICD-10-CM | POA: Diagnosis not present

## 2017-11-10 DIAGNOSIS — R69 Illness, unspecified: Secondary | ICD-10-CM | POA: Diagnosis not present

## 2017-11-11 DIAGNOSIS — E538 Deficiency of other specified B group vitamins: Secondary | ICD-10-CM | POA: Diagnosis not present

## 2017-11-21 ENCOUNTER — Telehealth: Payer: Self-pay | Admitting: Internal Medicine

## 2017-11-21 MED ORDER — MESALAMINE 1.2 G PO TBEC: 2.4000 g | DELAYED_RELEASE_TABLET | Freq: Every day | ORAL | 0 refills | Status: DC

## 2017-11-21 NOTE — Telephone Encounter (Signed)
Generic rx sent to [facility].

## 2017-11-21 NOTE — Telephone Encounter (Signed)
Yes, okay to change to generic JMP

## 2017-11-21 NOTE — Telephone Encounter (Signed)
Dr Hilarie Fredrickson, are you okay with this?

## 2017-11-23 NOTE — Telephone Encounter (Signed)
Ive spoken to Ms. Draughn. She has changed insurances. She now has [employer] PPO. ID: [id number]. RxPCN: [id number]  RxGroup: [id number]      (bottom of card -- [id number])  I will look at new formulary to see which medication is now preferred and will call patient back. She verbalizes understanding.

## 2017-11-23 NOTE — Telephone Encounter (Signed)
Pt is asking one month supply of Generic lialda be sent to  [facility].States she cannot afford the 3 month supply.

## 2017-11-24 MED ORDER — MESALAMINE ER 0.375 G PO CP24: 1500.0000 mg | ORAL_CAPSULE | Freq: Every day | ORAL | 0 refills | Status: DC

## 2017-11-24 NOTE — Addendum Note (Signed)
Addended by: Larina Bras on: 11/24/2017 03:10 PM   Modules accepted: Orders

## 2017-11-24 NOTE — Telephone Encounter (Signed)
Change Lialda to Apriso 1.5 g daily

## 2017-11-24 NOTE — Telephone Encounter (Signed)
Per patient's 2019 [employer] formulary, they will cover Apriso or sulfasalazine at the lowest cost. Please advise.Marland KitchenMarland Kitchen

## 2017-11-24 NOTE — Telephone Encounter (Signed)
I have contacted [facility], they ran trial claim for Apriso 4 capsules po daily, 90 day script. Rx should be $231 dollars for 90 days supply vs $395 for 90 days supply of generic mesalamine. I have asked that pharmacy go ahead and place this order in system for patient and will advise patient order is there if she would like for this to be filled since it seems to be quite a bit cheaper for her.  I have left a voicemail for patient to call back regarding this.

## 2017-11-25 ENCOUNTER — Telehealth: Payer: Self-pay | Admitting: Internal Medicine

## 2017-11-25 NOTE — Telephone Encounter (Signed)
Left message for pt to call back  °

## 2017-11-25 NOTE — Telephone Encounter (Signed)
Patient wanting to know if she is suppose to be taking pantoprazole and ranitidine.

## 2017-11-25 NOTE — Telephone Encounter (Signed)
I have spoken to patient to advise that rx has been sent for [facility] for 3 month supply and have given her the price for it. She verbalizes understanding.

## 2017-11-28 NOTE — Telephone Encounter (Signed)
Pt did not return call.

## 2017-12-05 ENCOUNTER — Telehealth: Payer: Self-pay | Admitting: Internal Medicine

## 2017-12-05 NOTE — Telephone Encounter (Signed)
Patient wanted to make sure she was to take all 4 capsules of Apriso at the same time. I advised that she should take all 4 capsules at the same time as this is how the medication is designed to work.

## 2017-12-12 ENCOUNTER — Other Ambulatory Visit: Payer: Self-pay | Admitting: Internal Medicine

## 2017-12-27 ENCOUNTER — Telehealth: Payer: Self-pay | Admitting: Internal Medicine

## 2017-12-27 DIAGNOSIS — H04563 Stenosis of bilateral lacrimal punctum: Secondary | ICD-10-CM | POA: Diagnosis not present

## 2017-12-27 DIAGNOSIS — H04223 Epiphora due to insufficient drainage, bilateral lacrimal glands: Secondary | ICD-10-CM | POA: Diagnosis not present

## 2017-12-27 MED ORDER — APRISO 0.375 G PO CP24: 1.5000 g | ORAL_CAPSULE | Freq: Every day | ORAL | 0 refills | Status: DC

## 2017-12-27 MED ORDER — PANTOPRAZOLE SODIUM 40 MG PO TBEC: 40.0000 mg | DELAYED_RELEASE_TABLET | Freq: Every day | ORAL | 0 refills | Status: DC

## 2017-12-27 NOTE — Telephone Encounter (Signed)
I have spoken to patient who was requesting refill on apriso and pantoprazole to [facility]. 90 day rx for both of those have been sent. Patient has also been scheduled for follow up with Dr Hilarie Fredrickson on 03/14/18.

## 2017-12-29 ENCOUNTER — Encounter (HOSPITAL_COMMUNITY): Payer: Self-pay | Admitting: Emergency Medicine

## 2017-12-29 ENCOUNTER — Other Ambulatory Visit: Payer: Self-pay

## 2017-12-29 ENCOUNTER — Ambulatory Visit (HOSPITAL_COMMUNITY)
Admission: EM | Admit: 2017-12-29 | Discharge: 2017-12-29 | Disposition: A | Discharge status: Home / Self Care | Payer: Medicare HMO | Attending: Family Medicine | Admitting: Family Medicine

## 2017-12-29 DIAGNOSIS — M545 Low back pain: Secondary | ICD-10-CM

## 2017-12-29 DIAGNOSIS — M4698 Unspecified inflammatory spondylopathy, sacral and sacrococcygeal region: Secondary | ICD-10-CM | POA: Diagnosis not present

## 2017-12-29 DIAGNOSIS — M47818 Spondylosis without myelopathy or radiculopathy, sacral and sacrococcygeal region: Secondary | ICD-10-CM

## 2017-12-29 DIAGNOSIS — M25551 Pain in right hip: Secondary | ICD-10-CM

## 2017-12-29 MED ORDER — METHYLPREDNISOLONE SODIUM SUCC 125 MG IJ SOLR
INTRAMUSCULAR | Status: AC
  Filled 2017-12-29: qty 2

## 2017-12-29 MED ORDER — METHYLPREDNISOLONE SODIUM SUCC 125 MG IJ SOLR
80.0000 mg | Freq: Once | INTRAMUSCULAR | Status: AC
  Administered 2017-12-29: 80 mg via INTRAMUSCULAR

## 2017-12-29 NOTE — ED Triage Notes (Signed)
Right hip and back pain that started last night.  History of the same and has had steroid shots for this in the past.  No known injury.

## 2017-12-30 ENCOUNTER — Telehealth (INDEPENDENT_AMBULATORY_CARE_PROVIDER_SITE_OTHER): Payer: Self-pay | Admitting: *Deleted

## 2017-12-30 NOTE — Telephone Encounter (Signed)
Repeat last, see if 15 minute slot somewhere

## 2018-01-03 NOTE — ED Provider Notes (Signed)
[place]   [id number] 12/29/17 Arrival Time: 1628  ASSESSMENT & PLAN:  1. SI joint arthritis ([place])     Meds ordered this encounter  Medications  . methylPREDNISolone sodium succinate (SOLU-MEDROL) 125 mg/2 mL injection 80 mg   Plans f/u with her PCP if not showing improvement in the next day or two. Reviewed expectations re: course of current medical issues. Questions answered. Outlined signs and symptoms indicating need for more acute intervention. Patient verbalized understanding. After Visit Summary given.   SUBJECTIVE: History from: patient.  Erica Carter is a 83 y.o. female who presents with complaint of fairly persistent right sided lower back discomfort along with hip discomfort; "but feel it more in my lower back". Onset gradual beginning yesterday. Injury/trama: no. History of back problems: yes, occasionally with similar symptoms. Previous back surgery: no. Discomfort described as aching without radiation. Certain movements exacerbate the described discomfort. Better with rest. Extremity sensation changes or weakness: none. Ambulatory without difficulty. Normal bowel/bladder habits. No associated abdominal pain/n/v. Self treatment: has not tried OTCs for relief of pain. "Usually get a steroid shot and it goes away."  Patient reports no fevers, IV drug use, recent back surgeries or procedures, urinary incontinence, or bowel incontinence.  ROS: As per HPI.   OBJECTIVE:  Vitals:   12/29/17 1649  BP: (!) 139/92  Pulse: 83  Resp: 16  Temp: 97.6 F (36.4 C)  TempSrc: Oral  SpO2: 99%  Weight: 125 lb (56.7 kg)  Height: 5' 1"  (1.549 m)    General appearance: alert; no distress Neck: supple with FROM; without midline tenderness Lungs: unlabored respirations; symmetrical air entry Abdomen: soft, non-tender; bowel sounds normal; no masses or organomegaly; no guarding or rebound tenderness Back: right sided lower tenderness present over SI joint  distribution; FROM at hips with mild discomfort reported; bruising: none; without midline tenderness Extremities: no cyanosis or edema; symmetrical with no gross deformities Skin: warm and dry Neurologic: normal gait; normal symmetric reflexes; normal LE strength and sensation Psychological: alert and cooperative; normal mood and affect  Allergies  Allergen Reactions  . Codeine     REACTION: Mouth swells    Past Medical History:  Diagnosis Date  . Acute gastritis without mention of hemorrhage   . Diverticulosis of colon (without mention of hemorrhage)   . Esophageal reflux   . Hiatal hernia   . Other and unspecified hyperlipidemia   . Personal history of colonic polyps 09/11/2010   hyperplasic  . Stricture and stenosis of esophagus   . Ulcerative colitis, unspecified   . Unspecified essential hypertension    Social History   Socioeconomic History  . Marital status: Married    Spouse name: Not on file  . Number of children: 2  . Years of education: Not on file  . Highest education level: Not on file  Social Needs  . Financial resource strain: Not on file  . Food insecurity - worry: Not on file  . Food insecurity - inability: Not on file  . Transportation needs - medical: Not on file  . Transportation needs - non-medical: Not on file  Occupational History  . Not on file  Tobacco Use  . Smoking status: Former Research scientist (life sciences)  . Smokeless tobacco: Never Used  Substance and Sexual Activity  . Alcohol use: No  . Drug use: No  . Sexual activity: Not on file  Other Topics Concern  . Not on file  Social History Narrative  . Not on file   Family History  Problem Relation Age of Onset  . Thyroid cancer Brother        mets to brain  . Diabetes Mother   . Diabetes Sister   . Colon cancer Neg Hx    Past Surgical History:  Procedure Laterality Date  . CATARACT EXTRACTION    . NASAL SINUS SURGERY    . THUMB ARTHROSCOPY    . TUBAL LIGATION       Vanessa Kick, MD 01/03/18  [phone]

## 2018-01-04 DIAGNOSIS — E1122 Type 2 diabetes mellitus with diabetic chronic kidney disease: Secondary | ICD-10-CM | POA: Diagnosis not present

## 2018-01-04 DIAGNOSIS — I129 Hypertensive chronic kidney disease with stage 1 through stage 4 chronic kidney disease, or unspecified chronic kidney disease: Secondary | ICD-10-CM | POA: Diagnosis not present

## 2018-01-04 DIAGNOSIS — Z20828 Contact with and (suspected) exposure to other viral communicable diseases: Secondary | ICD-10-CM | POA: Diagnosis not present

## 2018-01-04 DIAGNOSIS — G4700 Insomnia, unspecified: Secondary | ICD-10-CM | POA: Diagnosis not present

## 2018-01-04 DIAGNOSIS — E782 Mixed hyperlipidemia: Secondary | ICD-10-CM | POA: Diagnosis not present

## 2018-01-04 DIAGNOSIS — D692 Other nonthrombocytopenic purpura: Secondary | ICD-10-CM | POA: Diagnosis not present

## 2018-01-04 DIAGNOSIS — N183 Chronic kidney disease, stage 3 (moderate): Secondary | ICD-10-CM | POA: Diagnosis not present

## 2018-01-04 DIAGNOSIS — K219 Gastro-esophageal reflux disease without esophagitis: Secondary | ICD-10-CM | POA: Diagnosis not present

## 2018-01-04 DIAGNOSIS — E538 Deficiency of other specified B group vitamins: Secondary | ICD-10-CM | POA: Diagnosis not present

## 2018-01-30 IMAGING — CR DG CHEST 2V
2 series · 2 of 2 positions shown · non-contrast
Comparison: January 06, 2016.

CLINICAL DATA: Dizziness and hypertension.

EXAM:
CHEST  2 VIEW

[w chest lat]
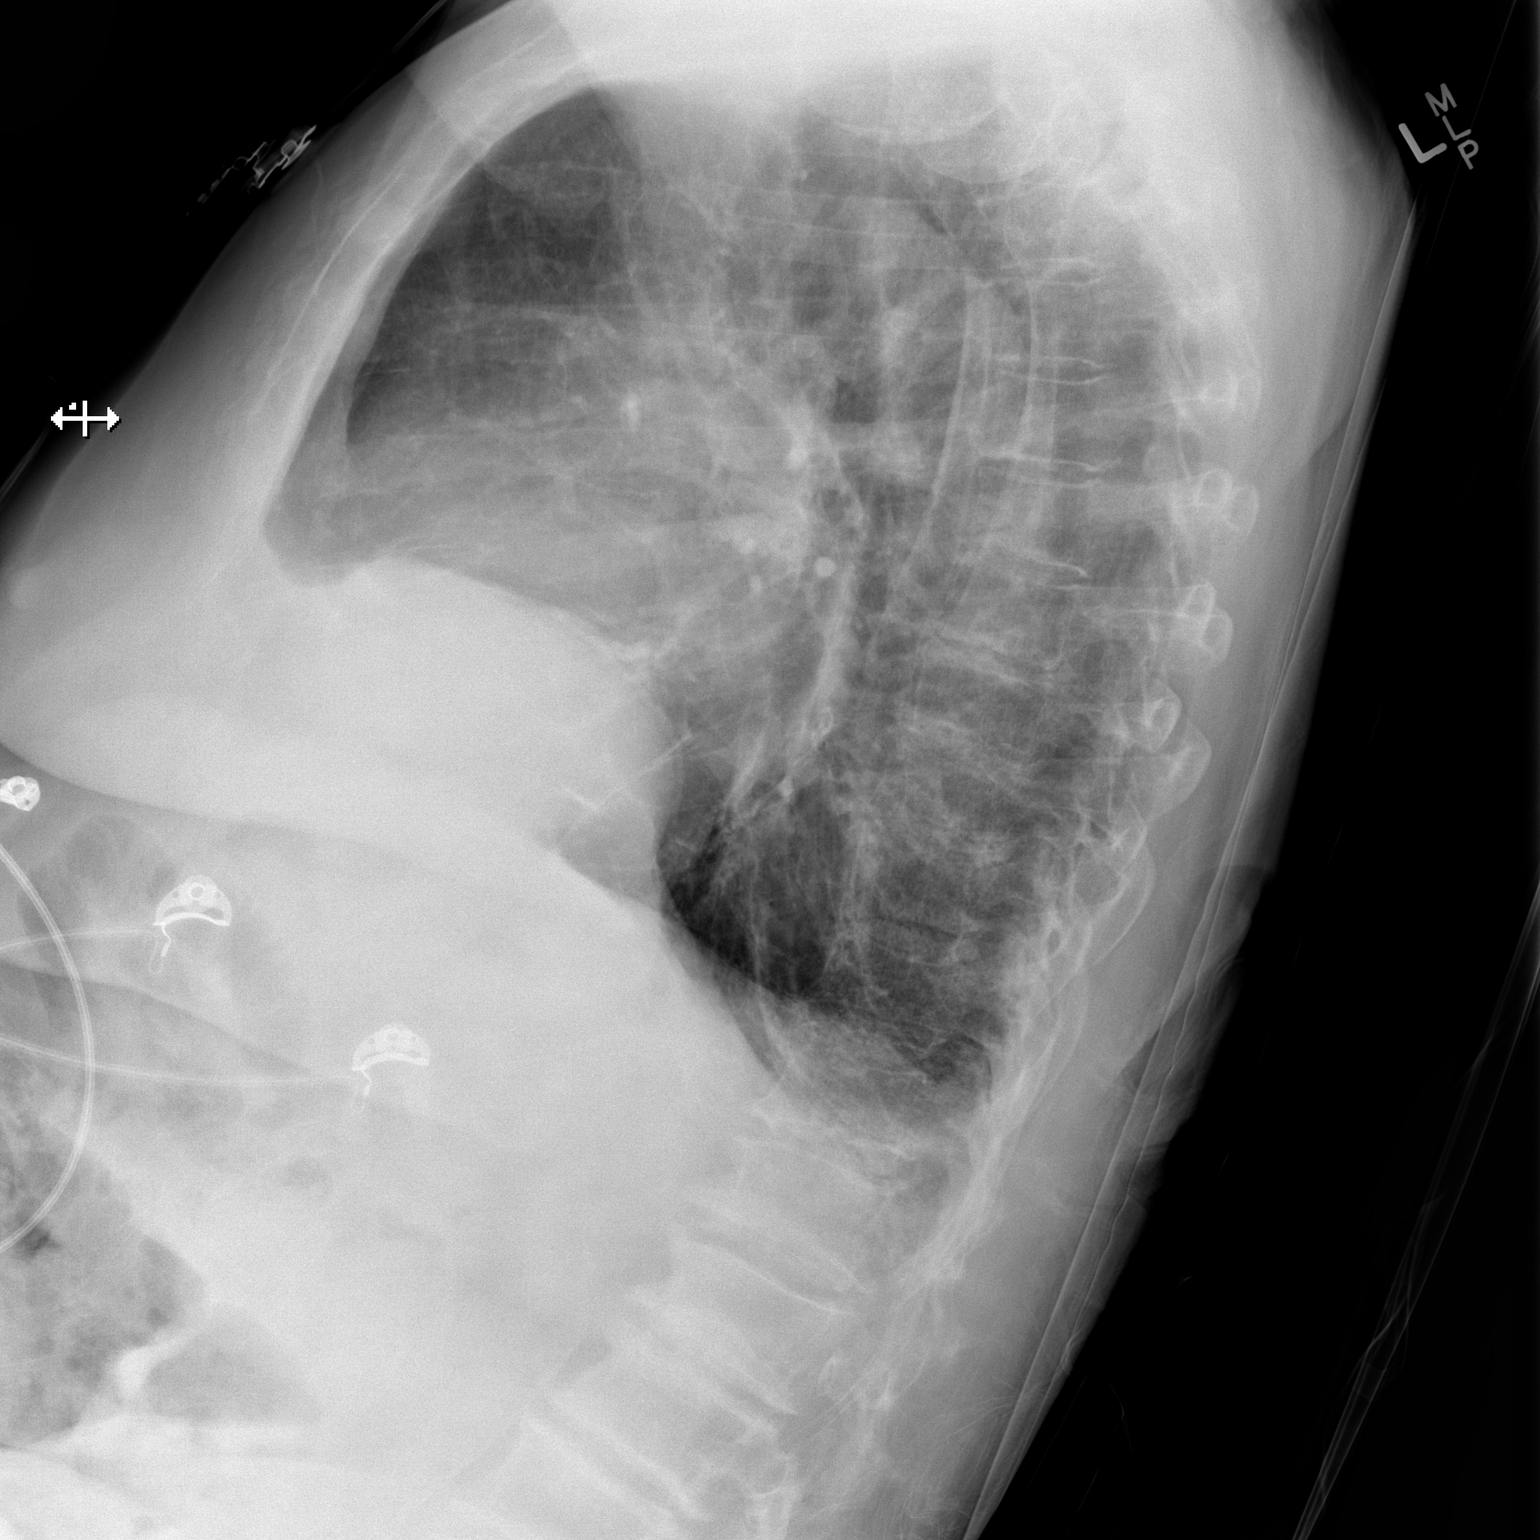

[x chest ap]
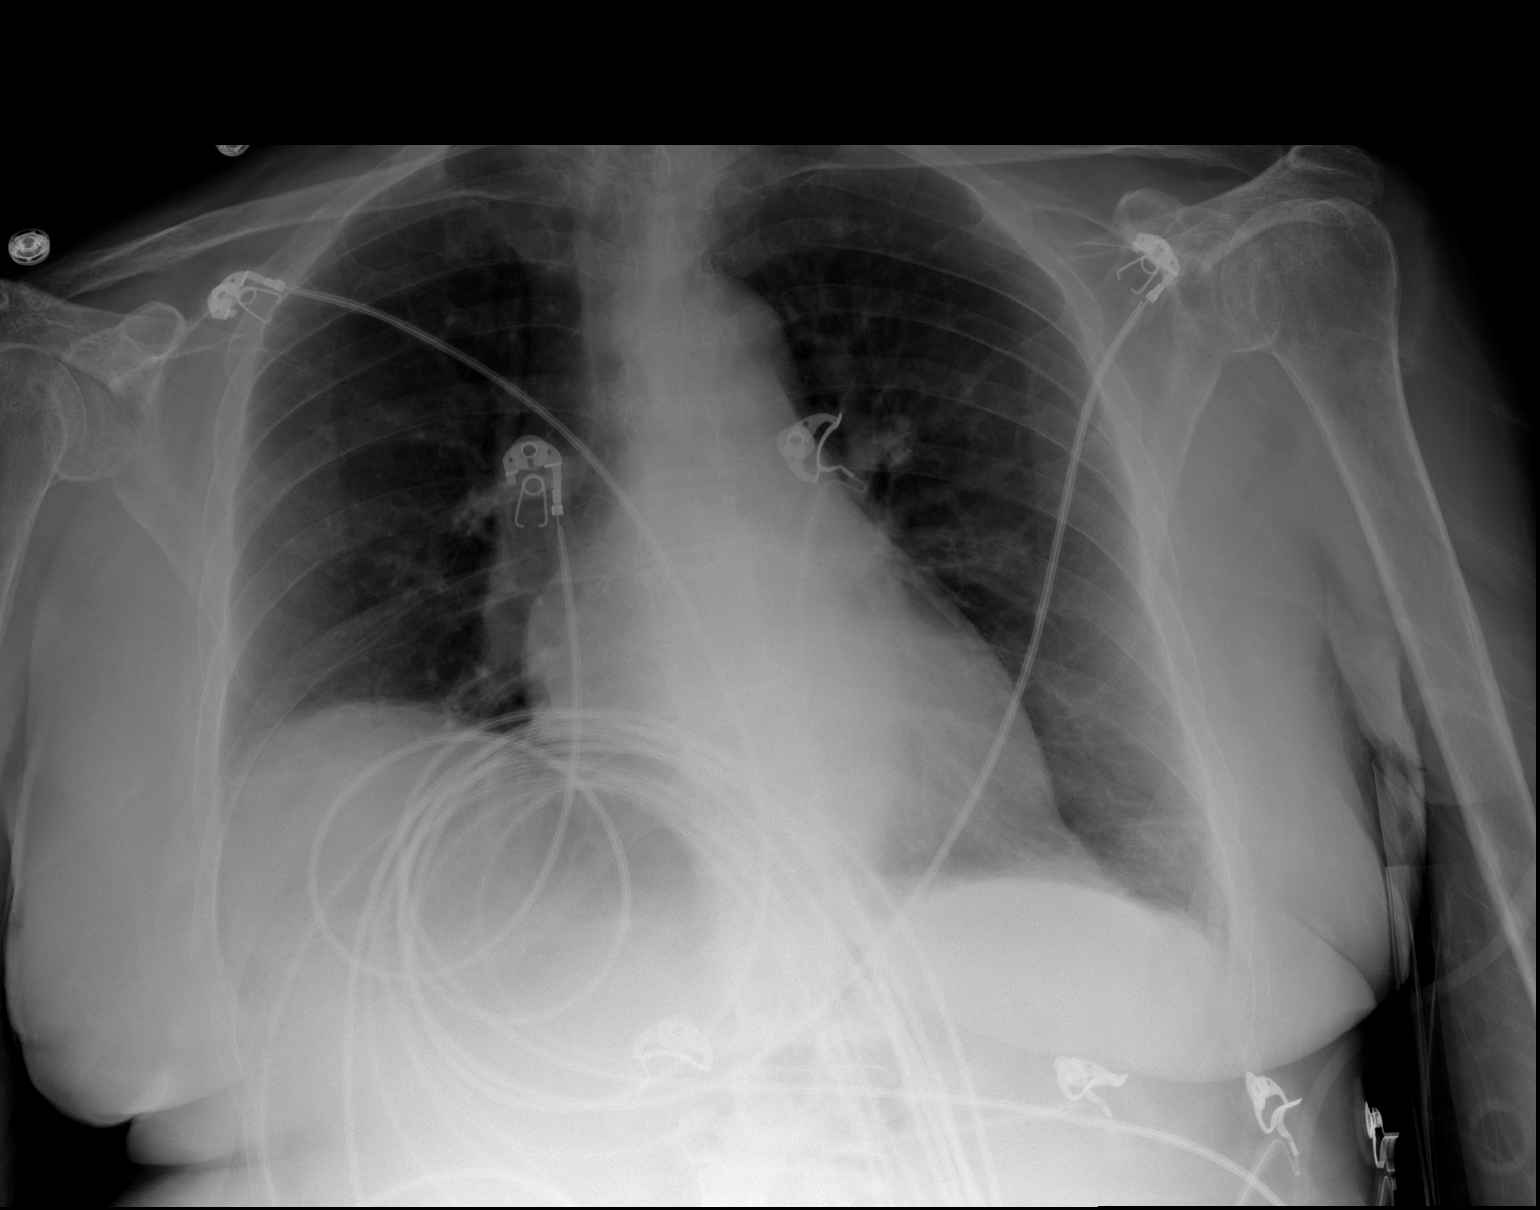

[2 of 2 positions shown; findings below may reference images not displayed]

FINDINGS: The cardiomediastinal silhouette is normal in size. Normal pulmonary
vascularity. Bibasilar atelectasis. No focal consolidation, pleural
effusion, or pneumothorax. Persistent elevation of the right
hemidiaphragm. No acute osseous abnormality.
IMPRESSION: No active cardiopulmonary disease.

## 2018-02-02 DIAGNOSIS — E538 Deficiency of other specified B group vitamins: Secondary | ICD-10-CM | POA: Diagnosis not present

## 2018-02-06 ENCOUNTER — Other Ambulatory Visit: Payer: Self-pay | Admitting: Internal Medicine

## 2018-03-07 DIAGNOSIS — E538 Deficiency of other specified B group vitamins: Secondary | ICD-10-CM | POA: Diagnosis not present

## 2018-03-14 ENCOUNTER — Encounter: Payer: Self-pay | Admitting: Internal Medicine

## 2018-03-14 ENCOUNTER — Ambulatory Visit: Payer: Medicare HMO | Admitting: Internal Medicine

## 2018-03-14 VITALS — BP 134/78 | HR 80 | Ht 60.0 in | Wt 125.2 lb

## 2018-03-14 DIAGNOSIS — K219 Gastro-esophageal reflux disease without esophagitis: Secondary | ICD-10-CM | POA: Diagnosis not present

## 2018-03-14 DIAGNOSIS — K5100 Ulcerative (chronic) pancolitis without complications: Secondary | ICD-10-CM

## 2018-03-14 MED ORDER — APRISO 0.375 G PO CP24: 1.5000 g | ORAL_CAPSULE | Freq: Every day | ORAL | 2 refills | Status: DC

## 2018-03-14 MED ORDER — PANTOPRAZOLE SODIUM 40 MG PO TBEC: 40.0000 mg | DELAYED_RELEASE_TABLET | Freq: Every day | ORAL | 2 refills | Status: DC

## 2018-03-14 NOTE — Patient Instructions (Signed)
We have sent the following prescriptions to your mail in pharmacy: Apriso Pantoprazole  If you have not heard from your mail in pharmacy within 1 week or if you have not received your medication in the mail, please contact us at [phone] so we may find out why.  Please follow up with Dr Hilarie Fredrickson in 1 year.  If you are age 81 or older, your body mass index should be between 23-30. Your Body mass index is 24.46 kg/m. If this is out of the aforementioned range listed, please consider follow up with your Primary Care Provider.  If you are age 65 or younger, your body mass index should be between 19-25. Your Body mass index is 24.46 kg/m. If this is out of the aformentioned range listed, please consider follow up with your Primary Care Provider.

## 2018-03-14 NOTE — Progress Notes (Signed)
   Subjective:    Patient ID: Erica Carter, female    DOB: 30-Oct-1935, 83 y.o.   MRN: [id number]  HPI Oluwatobi Ruppe is an 83 year old female with a past medical history of ulcerative colitis (diagnosis 20+ years ago), GERD who is here for follow-up.  She was last seen 1 year ago.  She is here alone today.  She reports that she has been doing very well in the past year.  Her mesalamine product was changed to [employer] though based on insurance coverage.  She has been using 1.5 g daily.  Bowel movements have been regular.  No blood in her stool or melena.  No diarrhea.  Has a bowel movement about once per day.  No blood in her stool or melena.  She continues pantoprazole 40 mg daily and with this her GERD and indigestion are very well controlled.  No dysphagia or odynophagia.  No nausea or vomiting.  Appetite has been good.  She is now sleeping on the couch which helps with her chronic back discomfort.  Still some issues with insomnia.  Her daughter who works out of town 3 days a week and her 31 year old grand son live with her.  This continues to be an intermittent stressful situation for her particularly now that her grandson, Geryl Rankins, is driving.  She continues to enjoy her beach home at [facility] and was there recently.   Review of Systems As per HPI, otherwise negative  Current Medications, Allergies, Past Medical History, Past Surgical History, Family History and Social History were reviewed in [employer] record.     Objective:   Physical Exam BP 134/78   Pulse 80   Ht 5' (1.524 m)   Wt 125 lb 4 oz (56.8 kg)   BMI 24.46 kg/m  Constitutional: Well-developed and well-nourished. No distress. HEENT: Normocephalic and atraumatic. Conjunctivae are normal.  No scleral icterus. Neck: Neck supple. Trachea midline. Cardiovascular: Normal rate, regular rhythm and intact distal pulses.  Pulmonary/chest: Effort normal and breath sounds normal. No wheezing, rales or  rhonchi. Abdominal: Soft, nontender, nondistended. Bowel sounds active throughout.  Extremities: no clubbing, cyanosis, or edema Neurological: Alert and oriented to person place and time. Skin: Skin is warm and dry. Psychiatric: Normal mood and affect. Behavior is normal.      Assessment & Plan:  83 year old female with a past medical history of ulcerative colitis (diagnosis 20+ years ago), GERD who is here for follow-up.  1.  Ulcerative colitis --in clinical remission and doing well on Apriso.  Continue Apriso 1.5 g daily.  She wishes to avoid further screening/surveillance colonoscopies based on her age.  2.  GERD --well-controlled after reinitiation of PPI therapy.  We will continue to resolve 40 mg once daily.  Annual follow-up, sooner if needed  15 minutes spent with the patient today. Greater than 50% was spent in counseling and coordination of care with the patient

## 2018-04-06 DIAGNOSIS — E538 Deficiency of other specified B group vitamins: Secondary | ICD-10-CM | POA: Diagnosis not present

## 2018-05-08 DIAGNOSIS — E538 Deficiency of other specified B group vitamins: Secondary | ICD-10-CM | POA: Diagnosis not present

## 2018-05-15 DIAGNOSIS — R69 Illness, unspecified: Secondary | ICD-10-CM | POA: Diagnosis not present

## 2018-06-06 DIAGNOSIS — E538 Deficiency of other specified B group vitamins: Secondary | ICD-10-CM | POA: Diagnosis not present

## 2018-07-07 DIAGNOSIS — Z0000 Encounter for general adult medical examination without abnormal findings: Secondary | ICD-10-CM | POA: Diagnosis not present

## 2018-07-07 DIAGNOSIS — E1122 Type 2 diabetes mellitus with diabetic chronic kidney disease: Secondary | ICD-10-CM | POA: Diagnosis not present

## 2018-07-07 DIAGNOSIS — N183 Chronic kidney disease, stage 3 (moderate): Secondary | ICD-10-CM | POA: Diagnosis not present

## 2018-07-07 DIAGNOSIS — M810 Age-related osteoporosis without current pathological fracture: Secondary | ICD-10-CM | POA: Diagnosis not present

## 2018-07-07 DIAGNOSIS — K5190 Ulcerative colitis, unspecified, without complications: Secondary | ICD-10-CM | POA: Diagnosis not present

## 2018-07-07 DIAGNOSIS — I129 Hypertensive chronic kidney disease with stage 1 through stage 4 chronic kidney disease, or unspecified chronic kidney disease: Secondary | ICD-10-CM | POA: Diagnosis not present

## 2018-07-07 DIAGNOSIS — D692 Other nonthrombocytopenic purpura: Secondary | ICD-10-CM | POA: Diagnosis not present

## 2018-07-07 DIAGNOSIS — D519 Vitamin B12 deficiency anemia, unspecified: Secondary | ICD-10-CM | POA: Diagnosis not present

## 2018-07-07 DIAGNOSIS — E782 Mixed hyperlipidemia: Secondary | ICD-10-CM | POA: Diagnosis not present

## 2018-07-07 DIAGNOSIS — I422 Other hypertrophic cardiomyopathy: Secondary | ICD-10-CM | POA: Diagnosis not present

## 2018-08-04 DIAGNOSIS — E538 Deficiency of other specified B group vitamins: Secondary | ICD-10-CM | POA: Diagnosis not present

## 2018-08-07 ENCOUNTER — Telehealth: Payer: Self-pay | Admitting: Internal Medicine

## 2018-08-07 MED ORDER — APRISO 0.375 G PO CP24: 1.5000 g | ORAL_CAPSULE | Freq: Every day | ORAL | 2 refills | Status: DC

## 2018-08-07 NOTE — Telephone Encounter (Signed)
Patient states that [employer] cancelled her Erica Carter order in error. They tell her they need a new script. Rx has been sent to [facility].

## 2018-08-08 DIAGNOSIS — H04563 Stenosis of bilateral lacrimal punctum: Secondary | ICD-10-CM | POA: Diagnosis not present

## 2018-08-08 DIAGNOSIS — H04223 Epiphora due to insufficient drainage, bilateral lacrimal glands: Secondary | ICD-10-CM | POA: Diagnosis not present

## 2018-08-08 DIAGNOSIS — Z961 Presence of intraocular lens: Secondary | ICD-10-CM | POA: Diagnosis not present

## 2018-08-08 DIAGNOSIS — H52203 Unspecified astigmatism, bilateral: Secondary | ICD-10-CM | POA: Diagnosis not present

## 2018-08-16 ENCOUNTER — Telehealth: Payer: Self-pay | Admitting: Internal Medicine

## 2018-08-16 NOTE — Telephone Encounter (Signed)
Left voicemail for patient to call back. 

## 2018-08-17 NOTE — Telephone Encounter (Signed)
Patient states that [employer] eventually told her they did not have her prescription for Apriso, however, they now tell her they do have it and it has been shipped. She no longer needs [place] at this time.

## 2018-08-17 NOTE — Telephone Encounter (Signed)
Pt returned your call and would like another call.

## 2018-08-30 DIAGNOSIS — E538 Deficiency of other specified B group vitamins: Secondary | ICD-10-CM | POA: Diagnosis not present

## 2018-09-15 ENCOUNTER — Telehealth: Payer: Self-pay | Admitting: Internal Medicine

## 2018-09-15 NOTE — Telephone Encounter (Signed)
Patient states that she received a letter from her insurance company advising that as of January 2020, they will no longer cover her Apriso. She states that it is indicated to "try mesalamine" instead. I have advised that Apriso is mesalamine. For now, we will continue patient on Apriso. If there is a problem with coverage at the beginning of the year, we will take care of it at that time as I am not sure what they are wanting at this time. My assumption is that she will be made to take the generic mesalamine from her pharmacy rather than getting the name brand Apriso as she is currently getting from her mail in pharmacy. Patient verbalizes understanding and agrees to this plan.

## 2018-09-27 DIAGNOSIS — E538 Deficiency of other specified B group vitamins: Secondary | ICD-10-CM | POA: Diagnosis not present

## 2018-10-26 DIAGNOSIS — E538 Deficiency of other specified B group vitamins: Secondary | ICD-10-CM | POA: Diagnosis not present

## 2018-11-08 ENCOUNTER — Other Ambulatory Visit: Payer: Self-pay

## 2018-11-08 ENCOUNTER — Encounter (HOSPITAL_COMMUNITY): Payer: Self-pay | Admitting: Emergency Medicine

## 2018-11-08 ENCOUNTER — Emergency Department (HOSPITAL_COMMUNITY)
Admission: EM | Admit: 2018-11-08 | Discharge: 2018-11-08 | Disposition: A | Discharge status: Home / Self Care | Payer: Medicare HMO | Attending: Emergency Medicine | Admitting: Emergency Medicine

## 2018-11-08 ENCOUNTER — Emergency Department (HOSPITAL_COMMUNITY): Payer: Medicare HMO

## 2018-11-08 DIAGNOSIS — Z87891 Personal history of nicotine dependence: Secondary | ICD-10-CM | POA: Insufficient documentation

## 2018-11-08 DIAGNOSIS — I10 Essential (primary) hypertension: Secondary | ICD-10-CM | POA: Insufficient documentation

## 2018-11-08 DIAGNOSIS — Z7982 Long term (current) use of aspirin: Secondary | ICD-10-CM | POA: Diagnosis not present

## 2018-11-08 DIAGNOSIS — M7989 Other specified soft tissue disorders: Secondary | ICD-10-CM | POA: Diagnosis not present

## 2018-11-08 DIAGNOSIS — Z79899 Other long term (current) drug therapy: Secondary | ICD-10-CM | POA: Insufficient documentation

## 2018-11-08 DIAGNOSIS — M79641 Pain in right hand: Secondary | ICD-10-CM | POA: Diagnosis not present

## 2018-11-08 MED ORDER — CEPHALEXIN 500 MG PO CAPS: 500.0000 mg | ORAL_CAPSULE | Freq: Three times a day (TID) | ORAL | 0 refills | Status: DC

## 2018-11-08 MED ORDER — CEPHALEXIN 250 MG PO CAPS
500.0000 mg | ORAL_CAPSULE | Freq: Once | ORAL | Status: AC
  Administered 2018-11-08: 500 mg via ORAL
  Filled 2018-11-08: qty 2

## 2018-11-08 MED ORDER — IBUPROFEN 400 MG PO TABS
400.0000 mg | ORAL_TABLET | Freq: Once | ORAL | Status: AC
Start: 2018-11-08 — End: 2018-11-08
  Administered 2018-11-08: 400 mg via ORAL
  Filled 2018-11-08: qty 1

## 2018-11-08 NOTE — ED Provider Notes (Signed)
[facility] EMERGENCY DEPARTMENT Provider Note   CSN: [id number] Arrival date & time: 11/08/18  1445     History   Chief Complaint Chief Complaint  Patient presents with  . Hand Pain    HPI Erica Carter is a 84 y.o. female.  Patient is a 84 year old female who presents with pain and swelling to her right hand.  She states is been going on about 4 to 5 days.  She denies any known injury.  She denies any wounds.  Her tetanus shot is up-to-date.  She says it hurts when she moves her hand.  She denies any numbness or weakness in the hand.  No known fevers.     Past Medical History:  Diagnosis Date  . Acute gastritis without mention of hemorrhage   . Diverticulosis of colon (without mention of hemorrhage)   . Esophageal reflux   . Hiatal hernia   . Other and unspecified hyperlipidemia   . Personal history of colonic polyps 09/11/2010   hyperplasic  . Stricture and stenosis of esophagus   . Ulcerative colitis, unspecified   . Unspecified essential hypertension     Patient Active Problem List   Diagnosis Date Noted  . Status post epidural steroid injection 08/04/2017  . Unilateral primary osteoarthritis, left hip 06/17/2017  . History of gastroesophageal reflux (GERD) 10/26/2011  . GASTRITIS, ACUTE W/O HEMORRHAGE 09/11/2010  . HYPERLIPIDEMIA 02/12/2008  . HYPERTENSION 02/12/2008  . GERD 02/12/2008  . 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 HIATAL HERNIA 05/17/2006  . COLITIS, ULCERATIVE 05/17/2006  . DIVERTICULOSIS, COLON 05/17/2006    Past Surgical History:  Procedure Laterality Date  . CATARACT EXTRACTION    . NASAL SINUS SURGERY    . THUMB ARTHROSCOPY    . TUBAL LIGATION       OB History   No obstetric history on file.      Home Medications    Prior to Admission medications   Medication Sig Start Date End Date Taking? Authorizing Provider  amLODipine (NORVASC) 5 MG tablet Take 5 mg by mouth daily.    [provider]  APRISO 0.375 g  24 hr capsule Take 4 capsules (1.5 g total) by mouth daily. 08/07/18   Pyrtle, Lajuan Lines, MD  aspirin 81 MG tablet Take 81 mg by mouth daily.    [provider]  atorvastatin (LIPITOR) 20 MG tablet Take 20 mg by mouth daily.     [provider]  calcium gluconate 500 MG tablet Take 500 mg by mouth daily.      [provider]  cephALEXin (KEFLEX) 500 MG capsule Take 1 capsule (500 mg total) by mouth 3 (three) times daily. 11/08/18   Malvin Johns, MD  cholecalciferol (VITAMIN D) 1000 UNITS tablet Take 1,000 Units by mouth daily.      [provider]  Cyanocobalamin (VITAMIN B-12 IJ) Inject as directed. Pt gets shot once a month    [provider]  LORazepam (ATIVAN) 1 MG tablet Take 1 mg by mouth 2 (two) times daily. 07/05/17   [provider]  losartan (COZAAR) 100 MG tablet Take 100 mg by mouth daily.      [provider]  Misc Natural Products (COLON CARE PO) Take 1 tablet by mouth daily.      [provider]  Multiple Vitamins-Minerals (CENTRUM SILVER PO) Take 1 tablet by mouth daily.      [provider]  pantoprazole (PROTONIX) 40 MG tablet Take 1 tablet (40 mg  total) by mouth daily. 03/14/18   Pyrtle, Lajuan Lines, MD  tiZANidine (ZANAFLEX) 4 MG tablet Take 1 tablet (4 mg total) by mouth every 8 (eight) hours as needed for muscle spasms. Patient not taking: Reported on 07/12/2017 06/20/17   Mcarthur Rossetti, MD  traMADol (ULTRAM) 50 MG tablet Take 2 tablets (100 mg total) by mouth every 8 (eight) hours as needed. 06/20/17   Mcarthur Rossetti, MD  vitamin C (ASCORBIC ACID) 500 MG tablet Take 500 mg by mouth daily.      [provider]    Family History Family History  Problem Relation Age of Onset  . Thyroid cancer Brother        mets to brain  . Diabetes Mother   . Diabetes Sister   . Colon cancer Neg Hx     Social History Social History   Tobacco Use  . Smoking status: Former Research scientist (life sciences)  . Smokeless  tobacco: Never Used  Substance Use Topics  . Alcohol use: No  . Drug use: No     Allergies   Codeine   Review of Systems Review of Systems  Constitutional: Negative for fever.  Gastrointestinal: Negative for nausea and vomiting.  Musculoskeletal: Positive for arthralgias and joint swelling. Negative for back pain and neck pain.  Skin: Positive for color change. Negative for wound.  Neurological: Negative for weakness, numbness and headaches.     Physical Exam Updated Vital Signs BP 121/81   Pulse 90   Temp 98.8 F (37.1 C) (Oral)   Resp 15   Ht 4' 11"  (1.499 m)   Wt 56.7 kg   SpO2 95%   BMI 25.25 kg/m   Physical Exam Constitutional:      Appearance: She is well-developed.  HENT:     Head: Normocephalic and atraumatic.  Neck:     Musculoskeletal: Normal range of motion and neck supple.  Cardiovascular:     Rate and Rhythm: Normal rate.  Pulmonary:     Effort: Pulmonary effort is normal.  Musculoskeletal:        General: Tenderness present.     Comments: Patient has some mild swelling and erythema to the dorsum of her right hand, more on the radial side.  It extends slightly over the radial side of the wrist but there is no noticeable joint effusion.  No induration or fluctuance.  She has full range of motion in all fingers without significant tenderness.  There is tenderness on palpation over the radial side of the hand.  Skin:    General: Skin is warm and dry.  Neurological:     Mental Status: She is alert and oriented to person, place, and time.      ED Treatments / Results  Labs (all labs ordered are listed, but only abnormal results are displayed) Labs Reviewed - No data to display  EKG None  Radiology Dg Hand Complete Right  Result Date: 11/08/2018 CLINICAL DATA:  Hand pain and swelling EXAM: RIGHT HAND - COMPLETE 3+ VIEW COMPARISON:  None. FINDINGS: No fracture or malalignment. Moderate arthritis at the first [facility] joint and STT interval. Mild  joint space narrowing and degenerative change at the first second and third MCP joints. Degenerative narrowing at the second through fifth DIP joints and second through fourth PIP joints. Bones appear osteopenic. Chondrocalcinosis. IMPRESSION: 1. No acute osseous abnormality 2. Arthritis within the digits of the hand as well as the radial aspect of the wrist. 3. Chondrocalcinosis Electronically Signed   By:  Donavan Foil M.D.   On: 11/08/2018 16:18    Procedures Procedures (including critical care time)  Medications Ordered in ED Medications  cephALEXin (KEFLEX) capsule 500 mg (has no administration in time range)  ibuprofen (ADVIL,MOTRIN) tablet 400 mg (400 mg Oral Given 11/08/18 1557)     Initial Impression / Assessment and Plan / ED Course  I have reviewed the triage vital signs and the nursing notes.  Pertinent labs & imaging results that were available during my care of the patient were reviewed by me and considered in my medical decision making (see chart for details).     Patient presents with swelling to the dorsum of her right hand.  There does not appear to be any joint involvement.  There is no concern for septic arthritis.  I feel this is likely inflammatory versus early cellulitis.  X-rays do reveal significant degenerative changes.  She was placed in a thumb spica splint.  She was advised ice and elevation.  She was advised to use ibuprofen and Tylenol for symptomatic relief.  She was advised to use caution with ibuprofen even though she says that she takes it at home without any issue.  She was also started on Keflex.  She was encouraged to follow-up with her doctor for recheck within the next 2 days.  Return precautions were given.  Final Clinical Impressions(s) / ED Diagnoses   Final diagnoses:  Right hand pain    ED Discharge Orders         Ordered    cephALEXin (KEFLEX) 500 MG capsule  3 times daily     11/08/18 1704           Malvin Johns, MD 11/08/18  1706

## 2018-11-08 NOTE — ED Triage Notes (Signed)
Pt reports pain and swelling to R hand since sat night, no known injury or trauma.

## 2018-11-08 NOTE — ED Notes (Signed)
Patient provided with ice pack for hand and instructed to place ice on hand for 20 minute increments

## 2018-11-08 NOTE — ED Notes (Signed)
Patient verbalizes understanding of discharge instructions. Opportunity for questioning and answers were provided. Armband removed by staff, pt discharged from ED ambulatory with family.

## 2018-11-08 NOTE — Discharge Instructions (Addendum)
Keep your wrist in the splint.  Ice it several times a day.  Use ibuprofen and Tylenol for symptomatic relief.  Follow-up with your doctor within the next 2 days for recheck.  Return here as needed if you have any worsening redness or swelling.

## 2018-11-08 NOTE — Progress Notes (Signed)
Orthopedic Tech Progress Note Patient Details:  Erica Carter Apr 26, 1935 [id number]  Ortho Devices Type of Ortho Device: Thumb velcro splint Ortho Device/Splint Location: rue Ortho Device/Splint Interventions: Ordered, Application, Adjustment   Post Interventions Patient Tolerated: Well Instructions Provided: Care of device, Adjustment of device   Karolee Stamps 11/08/2018, 5:44 PM

## 2018-11-08 NOTE — ED Notes (Signed)
Ortho notified of need for thumb spica

## 2018-11-08 NOTE — ED Notes (Signed)
Patient ambulatory with steady gait to exam room

## 2018-11-13 ENCOUNTER — Telehealth: Payer: Self-pay | Admitting: Internal Medicine

## 2018-11-13 MED ORDER — APRISO 0.375 G PO CP24: 1.5000 g | ORAL_CAPSULE | Freq: Every day | ORAL | 0 refills | Status: DC

## 2018-11-13 NOTE — Telephone Encounter (Signed)
Pt needs rf for Apriso, she wants to to speak with you regarding insurance coverage for this med.

## 2018-11-13 NOTE — Telephone Encounter (Signed)
Rx refill sent for Apriso to [facility]. Patient advised.

## 2018-11-17 DIAGNOSIS — M19041 Primary osteoarthritis, right hand: Secondary | ICD-10-CM | POA: Diagnosis not present

## 2018-11-20 ENCOUNTER — Telehealth: Payer: Self-pay | Admitting: Internal Medicine

## 2018-11-20 NOTE — Telephone Encounter (Addendum)
Patient advised that we have requested a prior authorization on Apriso. We will await insurance approval/denial and call her back. She verbalizes understanding.  Patient has tried Lialda and Pentasa in the past. 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

## 2018-11-20 NOTE — Telephone Encounter (Signed)
Pt states that Apriso is non formulary and requests a new pscrp for an alternative drug.  Please advise.

## 2018-11-21 NOTE — Telephone Encounter (Signed)
[place] [employer] has approved patient's Apriso from 11/06/18 thru 11/08/2019. Referral number [id number].   I have called patient to advise of approval and she states insurance called to advise her of this as well.

## 2018-11-22 DIAGNOSIS — R69 Illness, unspecified: Secondary | ICD-10-CM | POA: Diagnosis not present

## 2018-11-23 DIAGNOSIS — E538 Deficiency of other specified B group vitamins: Secondary | ICD-10-CM | POA: Diagnosis not present

## 2018-11-27 ENCOUNTER — Telehealth: Payer: Self-pay | Admitting: Internal Medicine

## 2018-11-27 MED ORDER — MESALAMINE 1.2 G PO TBEC: 2.4000 g | DELAYED_RELEASE_TABLET | Freq: Every day | ORAL | 0 refills | Status: DC

## 2018-11-27 MED ORDER — PANTOPRAZOLE SODIUM 40 MG PO TBEC: 40.0000 mg | DELAYED_RELEASE_TABLET | Freq: Every day | ORAL | 0 refills | Status: DC

## 2018-11-27 NOTE — Telephone Encounter (Signed)
Pt requests a refill for pantoprazole to [facility].  Pt would like CB regarding another medication.

## 2018-11-27 NOTE — Telephone Encounter (Signed)
Yes, mesalamine dose would be 2.4 g daily

## 2018-11-27 NOTE — Telephone Encounter (Signed)
Dr Hilarie Fredrickson, although I got Apriso approved for patient, it is still over $350 for a 1 month supply! Would you like me to try to send for mesalamine 1.2 g again to see if it is covered since it has gone generic?

## 2018-11-27 NOTE — Telephone Encounter (Signed)
Rx sent for mesalamine 1.2 g-2 tablets daily in place of Apriso. Advised patient we will try this to see if covered. She verbalizes understanding.  I also sent rx for pantoprazole as previously requested by patient.

## 2018-11-30 ENCOUNTER — Telehealth: Payer: Self-pay | Admitting: Internal Medicine

## 2018-11-30 MED ORDER — MESALAMINE ER 0.375 G PO CP24: 1600.0000 mg | ORAL_CAPSULE | Freq: Every day | ORAL | 0 refills | Status: DC

## 2018-11-30 NOTE — Telephone Encounter (Signed)
Patient now tells me she can afford apriso and would rather take it. Have sent rx to pharmacy

## 2018-12-14 ENCOUNTER — Telehealth: Payer: Self-pay | Admitting: Internal Medicine

## 2018-12-14 NOTE — Telephone Encounter (Signed)
Patient received mesalamine 0.375 mg and wanted to be sure this was the same as Apriso name brand. I assured her this was the same thing. She also states she was only given #120 tablets. We sent for #360. Told her she may need to call her pharmacy to tell them she only got 1 month supply since I dont know if she is being charged for 1 month or 3 months for the current rx she just got.

## 2018-12-14 NOTE — Telephone Encounter (Signed)
Pt called wanting to speak with nurse about the new medication before she begins to take it.

## 2018-12-21 DIAGNOSIS — E538 Deficiency of other specified B group vitamins: Secondary | ICD-10-CM | POA: Diagnosis not present

## 2019-01-09 ENCOUNTER — Telehealth: Payer: Self-pay | Admitting: Internal Medicine

## 2019-01-09 MED ORDER — PANTOPRAZOLE SODIUM 40 MG PO TBEC: 40.0000 mg | DELAYED_RELEASE_TABLET | Freq: Every day | ORAL | 0 refills | Status: DC

## 2019-01-09 MED ORDER — MESALAMINE ER 0.375 G PO CP24: 1600.0000 mg | ORAL_CAPSULE | Freq: Every day | ORAL | 0 refills | Status: DC

## 2019-01-09 NOTE — Telephone Encounter (Signed)
Pt stated that insurance change and needing to get this medication refill.

## 2019-01-09 NOTE — Telephone Encounter (Signed)
I spoke to patient. She states that insurance changed to [facility] and she needs pantoprazole sent to [facility] and thinks mesalamine should be sent to them too. Rx's sent.

## 2019-01-11 ENCOUNTER — Telehealth: Payer: Self-pay | Admitting: Internal Medicine

## 2019-01-11 NOTE — Telephone Encounter (Signed)
Pt returned your call, pls try her again.

## 2019-01-11 NOTE — Telephone Encounter (Signed)
Pt called and wanting to speak with someone about the medication mesalamine (APRISO) 0.375 g 24 hr capsule [[id number]]

## 2019-01-11 NOTE — Telephone Encounter (Signed)
Left voicemail for patient to call back. 

## 2019-01-12 NOTE — Telephone Encounter (Signed)
Patient states that insurance has advised that we need to "let them know why she needs to be on Apriso." She states that they gave her phone number [phone] for [place] to call. She also gave her insurance information. I have completed prior authorization request through [email or web address] for Apriso with [employer]. We will await their response. PA Case ID: [id number]   Of note: Patient's new insurance information is as follows... [employer] KK:[id number] RxBIN: [id number] RXPCN: [id number] [id number]: [id number]

## 2019-01-12 NOTE — Telephone Encounter (Signed)
Patient asks if we have "heard anything from insurance yet about that medicine." I advised that we sent mesalamine and pantoprazole as she requested several days ago but we have not gotten any further correspondence. I asked that she contact pharmacy a couple days ago when we talked but I am unsure that she did. She indicates she will call them today to see if they filled this. I told her she is free to give pharmacy/insurance my name and our phone number should they have questions or if they have another preference as to what medication she needs to take.

## 2019-01-15 NOTE — Telephone Encounter (Signed)
We have gotten response from [employer] that Apriso (mesalamine ER 0.375) has been approved until 11/08/2019.

## 2019-01-16 DIAGNOSIS — E782 Mixed hyperlipidemia: Secondary | ICD-10-CM | POA: Diagnosis not present

## 2019-01-16 DIAGNOSIS — K5190 Ulcerative colitis, unspecified, without complications: Secondary | ICD-10-CM | POA: Diagnosis not present

## 2019-01-16 DIAGNOSIS — E538 Deficiency of other specified B group vitamins: Secondary | ICD-10-CM | POA: Diagnosis not present

## 2019-01-16 DIAGNOSIS — N183 Chronic kidney disease, stage 3 (moderate): Secondary | ICD-10-CM | POA: Diagnosis not present

## 2019-01-16 DIAGNOSIS — E1122 Type 2 diabetes mellitus with diabetic chronic kidney disease: Secondary | ICD-10-CM | POA: Diagnosis not present

## 2019-01-16 DIAGNOSIS — N6452 Nipple discharge: Secondary | ICD-10-CM | POA: Diagnosis not present

## 2019-01-16 DIAGNOSIS — N61 Mastitis without abscess: Secondary | ICD-10-CM | POA: Diagnosis not present

## 2019-01-16 DIAGNOSIS — D692 Other nonthrombocytopenic purpura: Secondary | ICD-10-CM | POA: Diagnosis not present

## 2019-01-16 DIAGNOSIS — I129 Hypertensive chronic kidney disease with stage 1 through stage 4 chronic kidney disease, or unspecified chronic kidney disease: Secondary | ICD-10-CM | POA: Diagnosis not present

## 2019-01-23 DIAGNOSIS — N6041 Mammary duct ectasia of right breast: Secondary | ICD-10-CM | POA: Diagnosis not present

## 2019-01-23 DIAGNOSIS — N6452 Nipple discharge: Secondary | ICD-10-CM | POA: Diagnosis not present

## 2019-01-23 DIAGNOSIS — N6459 Other signs and symptoms in breast: Secondary | ICD-10-CM | POA: Diagnosis not present

## 2019-01-24 DIAGNOSIS — I129 Hypertensive chronic kidney disease with stage 1 through stage 4 chronic kidney disease, or unspecified chronic kidney disease: Secondary | ICD-10-CM | POA: Diagnosis not present

## 2019-01-24 DIAGNOSIS — N61 Mastitis without abscess: Secondary | ICD-10-CM | POA: Diagnosis not present

## 2019-01-24 DIAGNOSIS — R05 Cough: Secondary | ICD-10-CM | POA: Diagnosis not present

## 2019-02-13 DIAGNOSIS — E538 Deficiency of other specified B group vitamins: Secondary | ICD-10-CM | POA: Diagnosis not present

## 2019-02-22 DIAGNOSIS — I10 Essential (primary) hypertension: Secondary | ICD-10-CM | POA: Diagnosis not present

## 2019-02-22 DIAGNOSIS — Z9842 Cataract extraction status, left eye: Secondary | ICD-10-CM | POA: Diagnosis not present

## 2019-02-22 DIAGNOSIS — K219 Gastro-esophageal reflux disease without esophagitis: Secondary | ICD-10-CM | POA: Diagnosis not present

## 2019-02-22 DIAGNOSIS — E559 Vitamin D deficiency, unspecified: Secondary | ICD-10-CM | POA: Diagnosis not present

## 2019-02-22 DIAGNOSIS — Z9841 Cataract extraction status, right eye: Secondary | ICD-10-CM | POA: Diagnosis not present

## 2019-02-22 DIAGNOSIS — K5190 Ulcerative colitis, unspecified, without complications: Secondary | ICD-10-CM | POA: Diagnosis not present

## 2019-02-22 DIAGNOSIS — Z7982 Long term (current) use of aspirin: Secondary | ICD-10-CM | POA: Diagnosis not present

## 2019-03-06 ENCOUNTER — Encounter: Payer: Self-pay | Admitting: Internal Medicine

## 2019-03-06 ENCOUNTER — Ambulatory Visit (INDEPENDENT_AMBULATORY_CARE_PROVIDER_SITE_OTHER): Payer: Medicare PPO | Admitting: Internal Medicine

## 2019-03-06 ENCOUNTER — Other Ambulatory Visit: Payer: Self-pay

## 2019-03-06 VITALS — Ht 60.5 in | Wt 123.0 lb

## 2019-03-06 DIAGNOSIS — K5100 Ulcerative (chronic) pancolitis without complications: Secondary | ICD-10-CM | POA: Diagnosis not present

## 2019-03-06 DIAGNOSIS — K5909 Other constipation: Secondary | ICD-10-CM | POA: Diagnosis not present

## 2019-03-06 DIAGNOSIS — K219 Gastro-esophageal reflux disease without esophagitis: Secondary | ICD-10-CM | POA: Diagnosis not present

## 2019-03-06 MED ORDER — OMEPRAZOLE 40 MG PO CPDR: 40.0000 mg | DELAYED_RELEASE_CAPSULE | Freq: Every day | ORAL | 0 refills | Status: DC

## 2019-03-06 NOTE — Patient Instructions (Addendum)
Discontinue pantoprazole.  We have sent the following medications to your pharmacy for you to pick up at your convenience: omeprazole 40 mg 30 minutes before breakfast.  Try this for 2 weeks and if symptoms remain uncontrolled, increase this to twice daily before meals.    Call our office back in 2 weeks to let us know if you feel you need the second daily dose.   Please continue to follow GERD diet and precautions.  Continue Apriso 1.5 g daily.   Gastroesophageal Reflux Disease, Adult Gastroesophageal reflux (GER) happens when acid from the stomach flows up into the tube that connects the mouth and the stomach (esophagus). Normally, food travels down the esophagus and stays in the stomach to be digested. With GER, food and stomach acid sometimes move back up into the esophagus. You may have a disease called gastroesophageal reflux disease (GERD) if the reflux:  Happens often.  Causes frequent or very bad symptoms.  Causes problems such as damage to the esophagus. When this happens, the esophagus becomes sore and swollen (inflamed). Over time, GERD can make small holes (ulcers) in the lining of the esophagus. What are the causes? This condition is caused by a problem with the muscle between the esophagus and the stomach. When this muscle is weak or not normal, it does not close properly to keep food and acid from coming back up from the stomach. The muscle can be weak because of:  Tobacco use.  Pregnancy.  Having a certain type of hernia (hiatal hernia).  Alcohol use.  Certain foods and drinks, such as coffee, chocolate, onions, and peppermint. What increases the risk? You are more likely to develop this condition if you:  Are overweight.  Have a disease that affects your connective tissue.  Use NSAID medicines. What are the signs or symptoms? Symptoms of this condition include:  Heartburn.  Difficult or painful swallowing.  The feeling of having a lump in the  throat.  A bitter taste in the mouth.  Bad breath.  Having a lot of saliva.  Having an upset or bloated stomach.  Belching.  Chest pain. Different conditions can cause chest pain. Make sure you see your doctor if you have chest pain.  Shortness of breath or noisy breathing (wheezing).  Ongoing (chronic) cough or a cough at night.  Wearing away of the surface of teeth (tooth enamel).  Weight loss. How is this treated? Treatment will depend on how bad your symptoms are. Your doctor may suggest:  Changes to your diet.  Medicine.  Surgery. Follow these instructions at home: Eating and drinking   Follow a diet as told by your doctor. You may need to avoid foods and drinks such as: ? Coffee and tea (with or without caffeine). ? Drinks that contain alcohol. ? Energy drinks and sports drinks. ? Bubbly (carbonated) drinks or sodas. ? Chocolate and cocoa. ? Peppermint and mint flavorings. ? Garlic and onions. ? Horseradish. ? Spicy and acidic foods. These include peppers, chili powder, curry powder, vinegar, hot sauces, and BBQ sauce. ? Citrus fruit juices and citrus fruits, such as oranges, lemons, and limes. ? Tomato-based foods. These include red sauce, chili, salsa, and pizza with red sauce. ? Fried and fatty foods. These include donuts, french fries, potato chips, and high-fat dressings. ? High-fat meats. These include hot dogs, rib eye steak, sausage, ham, and bacon. ? High-fat dairy items, such as whole milk, butter, and cream cheese.  Eat small meals often. Avoid eating large meals.  Avoid drinking large amounts of liquid with your meals.  Avoid eating meals during the 2-3 hours before bedtime.  Avoid lying down right after you eat.  Do not exercise right after you eat. Lifestyle   Do not use any products that contain nicotine or tobacco. These include cigarettes, e-cigarettes, and chewing tobacco. If you need help quitting, ask your doctor.  Try to lower  your stress. If you need help doing this, ask your doctor.  If you are overweight, lose an amount of weight that is healthy for you. Ask your doctor about a safe weight loss goal. General instructions  Pay attention to any changes in your symptoms.  Take over-the-counter and prescription medicines only as told by your doctor. Do not take aspirin, ibuprofen, or other NSAIDs unless your doctor says it is okay.  Wear loose clothes. Do not wear anything tight around your waist.  Raise (elevate) the head of your bed about 6 inches (15 cm).  Avoid bending over if this makes your symptoms worse.  Keep all follow-up visits as told by your doctor. This is important. Contact a doctor if:  You have new symptoms.  You lose weight and you do not know why.  You have trouble swallowing or it hurts to swallow.  You have wheezing or a cough that keeps happening.  Your symptoms do not get better with treatment.  You have a hoarse voice. Get help right away if:  You have pain in your arms, neck, jaw, teeth, or back.  You feel sweaty, dizzy, or light-headed.  You have chest pain or shortness of breath.  You throw up (vomit) and your throw-up looks like blood or coffee grounds.  You pass out (faint).  Your poop (stool) is bloody or black.  You cannot swallow, drink, or eat. Summary  If a person has gastroesophageal reflux disease (GERD), food and stomach acid move back up into the esophagus and cause symptoms or problems such as damage to the esophagus.  Treatment will depend on how bad your symptoms are.  Follow a diet as told by your doctor.  Take all medicines only as told by your doctor. This information is not intended to replace advice given to you by your health care provider. Make sure you discuss any questions you have with your health care provider. Document Released: 04/12/2008 Document Revised: 05/03/2018 Document Reviewed: 05/03/2018 Elsevier Interactive Patient  Education  2019 [employer] for Gastroesophageal Reflux Disease, Adult When you have gastroesophageal reflux disease (GERD), the foods you eat and your eating habits are very important. Choosing the right foods can help ease your discomfort. Think about working with a nutrition specialist (dietitian) to help you make good choices. What are tips for following this plan?  Meals  Choose healthy foods that are low in fat, such as fruits, vegetables, whole grains, low-fat dairy products, and lean meat, fish, and poultry.  Eat small meals often instead of 3 large meals a day. Eat your meals slowly, and in a place where you are relaxed. Avoid bending over or lying down until 2-3 hours after eating.  Avoid eating meals 2-3 hours before bed.  Avoid drinking a lot of liquid with meals.  Cook foods using methods other than frying. Bake, grill, or broil food instead.  Avoid or limit: ? Chocolate. ? Peppermint or spearmint. ? Alcohol. ? Pepper. ? Black and decaffeinated coffee. ? Black and decaffeinated tea. ? Bubbly (carbonated) soft drinks. ? Caffeinated energy drinks and  soft drinks.  Limit high-fat foods such as: ? Fatty meat or fried foods. ? Whole milk, cream, butter, or ice cream. ? Nuts and nut butters. ? Pastries, donuts, and sweets made with butter or shortening.  Avoid foods that cause symptoms. These foods may be different for everyone. Common foods that cause symptoms include: ? Tomatoes. ? Oranges, lemons, and limes. ? Peppers. ? Spicy food. ? Onions and garlic. ? Vinegar. Lifestyle  Maintain a healthy weight. Ask your doctor what weight is healthy for you. If you need to lose weight, work with your doctor to do so safely.  Exercise for at least 30 minutes for 5 or more days each week, or as told by your doctor.  Wear loose-fitting clothes.  Do not smoke. If you need help quitting, ask your doctor.  Sleep with the head of your bed higher than  your feet. Use a wedge under the mattress or blocks under the bed frame to raise the head of the bed. Summary  When you have gastroesophageal reflux disease (GERD), food and lifestyle choices are very important in easing your symptoms.  Eat small meals often instead of 3 large meals a day. Eat your meals slowly, and in a place where you are relaxed.  Limit high-fat foods such as fatty meat or fried foods.  Avoid bending over or lying down until 2-3 hours after eating.  Avoid peppermint and spearmint, caffeine, alcohol, and chocolate. This information is not intended to replace advice given to you by your health care provider. Make sure you discuss any questions you have with your health care provider. Document Released: 04/25/2012 Document Revised: 11/30/2016 Document Reviewed: 11/30/2016 Elsevier Interactive Patient Education  2019 [employer].

## 2019-03-06 NOTE — Addendum Note (Signed)
Addended by: Larina Bras on: 03/06/2019 04:49 PM   Modules accepted: Orders

## 2019-03-06 NOTE — Progress Notes (Signed)
Subjective:    Patient ID: Erica Carter, female    DOB: Oct 17, 1935, 84 y.o.   MRN: [id number]    This service was provided via telemedicine.  Telephone call The patient was located at home The provider was located in GI office The patient did consent to this telephone visit and is aware of possible charges through their insurance for this visit.   The other persons participating in this telemedicine service were patient and I. Time spent on call: 11 min  HPI Erica Carter is an 84 year old female with a past medical history of ulcerative colitis (diagnosed 20+ years ago), GERD who is here for follow-up.  She is seen virtually today in the setting of COVID-19 pandemic.  She was last seen in the office on 03/14/2018.  She reports she has been having issues with her acid reflux of late.  This is despite pantoprazole 40 mg daily.  Her main issue is with sour brash and sour fluid coming back into her mouth.  There is no pain associated with this and no heartburn.  No dysphagia or odynophagia.  No nausea or vomiting.  No early satiety or change in appetite.  She reports stable weight.  She can have the sour brash day or night.  Bowel movements have recently been regular.  She has a bowel movement every 1 to 2 days.  She uses a over-the-counter product containing senna called Ultimate Colon Care 1 tablet a day.  She has used this for years.  No blood in her stool or melena.  No mid or lower abdominal pain.  She is taking Apriso 1.5 g daily.  Of note her last surveillance colonoscopy was in May 2015.  At that time there was melanosis but no evidence of active colitis.  Polyps were removed but none were adenomatous.  There were lymphoid polyps and benign polypoid mucosa.  There is no evidence of active or chronic inflammation on surveillance biopsies.  Review of Systems As per HPI, otherwise negative  Current Medications, Allergies, Past Medical History, Past Surgical History, Family History and  Social History were reviewed in [employer] record.     Objective:   Physical Exam No examination, virtual visit     Assessment & Plan:  84 year old female with a past medical history of ulcerative colitis (diagnosed 20+ years ago), GERD who is here for follow-up.  1.  GERD --not as controlled as previously despite pantoprazole 40 mg daily.  Will try omeprazole 40 mg daily.  May need to go to twice daily AC depending on symptoms.  No alarm symptoms to warrant endoscopy right now. --Discontinue pantoprazole --Add omeprazole 40 mg 30 minutes before breakfast.  She is instructed to try this for 2 weeks and if symptoms remain uncontrolled to increase this to twice daily AC.  She was asked to call me back in a couple weeks to let me know if she feels she needs the second daily dose.  She voiced understanding --GERD diet and precautions  2.  Ulcerative colitis --in clinical remission and doing well on Apriso.  Continue Apriso 1.5 g daily.  No diarrhea, bleeding or abdominal pain. --Continue Apriso 1.5 g daily --Surveillance colonoscopy is discontinued due to age, patient in full agreement  3.  Chronic constipation --she is taking an over-the-counter product which contains senna.  She had melanosis related to senna at last colonoscopy.  This product is effective for her and she will continue using this daily.  Annual follow-up, sooner  if needed

## 2019-03-13 DIAGNOSIS — E538 Deficiency of other specified B group vitamins: Secondary | ICD-10-CM | POA: Diagnosis not present

## 2019-04-06 ENCOUNTER — Other Ambulatory Visit: Payer: Self-pay | Admitting: Internal Medicine

## 2019-04-11 ENCOUNTER — Telehealth: Payer: Self-pay | Admitting: Internal Medicine

## 2019-04-11 MED ORDER — MESALAMINE ER 0.375 G PO CP24: 1.5000 g | ORAL_CAPSULE | Freq: Every day | ORAL | 1 refills | Status: DC

## 2019-04-11 NOTE — Telephone Encounter (Signed)
Patient is calling back would like a call back.

## 2019-04-11 NOTE — Telephone Encounter (Signed)
Resent corrected rx to pharmacy for mesalamine. Previous rx did not say to take orally. Patient advised.

## 2019-04-11 NOTE — Telephone Encounter (Signed)
Patient called said that the pharmacy reached out to her to let her know that they can not send out the medication mesalamine (APRISO) 0.375 g due to missing information. [phone]

## 2019-04-13 DIAGNOSIS — E538 Deficiency of other specified B group vitamins: Secondary | ICD-10-CM | POA: Diagnosis not present

## 2019-05-10 DIAGNOSIS — E538 Deficiency of other specified B group vitamins: Secondary | ICD-10-CM | POA: Diagnosis not present

## 2019-05-15 ENCOUNTER — Telehealth: Payer: Self-pay | Admitting: Internal Medicine

## 2019-05-15 NOTE — Telephone Encounter (Signed)
Pt states she is still having issues with reflux and the omeprazole 86m is not helping. States she is coughing up lots of mucous, like something is in her throat all the time. Please advise.

## 2019-05-15 NOTE — Telephone Encounter (Signed)
Would have her increase omeprazole to 40 mg twice a day; this should be taken 30 minutes to an hour before her first and last meal of the day. She should try this for 2-3 more weeks and if still having issues with reflux, including heartburn, cough, throat clearing then she should let me know. If this is the case we will discuss upper endoscopy

## 2019-05-15 NOTE — Telephone Encounter (Signed)
Patient said that the med is not doing what it should do the medication is omeprazole (PRILOSEC) 40 MG

## 2019-05-15 NOTE — Telephone Encounter (Signed)
See additional phone note.

## 2019-05-16 MED ORDER — OMEPRAZOLE 40 MG PO CPDR: 40.0000 mg | DELAYED_RELEASE_CAPSULE | Freq: Two times a day (BID) | ORAL | 3 refills | Status: DC

## 2019-05-16 NOTE — Telephone Encounter (Signed)
Spoke with pt and she is aware, new script sent to pharmacy.

## 2019-05-28 ENCOUNTER — Telehealth: Payer: Self-pay | Admitting: Internal Medicine

## 2019-05-28 NOTE — Telephone Encounter (Signed)
Patient called to update you on her medication. And would like to talk to [facility]

## 2019-05-28 NOTE — Telephone Encounter (Signed)
Erica Carter wanted to be certain Dr Hilarie Fredrickson was aware that she was on B12 supplementation and that it would not interfere with her omeprazole. I advised her that we do have b12 on her medication list and that b12 injections should not interfere with her omeprazole. She verbalizes understanding.

## 2019-06-04 ENCOUNTER — Telehealth: Payer: Self-pay | Admitting: Internal Medicine

## 2019-06-04 NOTE — Telephone Encounter (Signed)
Pls call pt, she states that she needs to speak with you regarding a medication that Dr. Hilarie Fredrickson put her on.

## 2019-06-05 NOTE — Telephone Encounter (Signed)
Patient indicates that she has taken the increased dose of omeprazole for the requested amount of time and continues with same symptoms of increased heartburn, cough and throat clearing. 05/15/19 telephone note indicates that if these symptoms continued we would "discuss upper endoscopy." Would you like this to be scheduled directly or does she need visit first?

## 2019-06-05 NOTE — Telephone Encounter (Signed)
Attempted to reach patient on mobile as requested. No answer and voicemail not set up. I also attemped to reach patient at home number and left voicemail for her to call back.

## 2019-06-05 NOTE — Telephone Encounter (Signed)
Pt called again requesting to discuss medication.  Please call her mobile.

## 2019-06-06 NOTE — Telephone Encounter (Signed)
Add famotidine 20 mg at night Okay for endoscopy

## 2019-06-06 NOTE — Telephone Encounter (Signed)
I have spoken to patient and advised of Dr Vena Rua recommendation to add famotidine 20 mg every night and his approval to schedule endoscopy. Patient has scheduled endoscopy on 07/02/2019 at 830 am. She is also scheduled for previsit on 06/19/2019. She verbalizes understanding.

## 2019-06-07 DIAGNOSIS — E538 Deficiency of other specified B group vitamins: Secondary | ICD-10-CM | POA: Diagnosis not present

## 2019-06-19 ENCOUNTER — Other Ambulatory Visit: Payer: Self-pay

## 2019-06-19 ENCOUNTER — Ambulatory Visit (AMBULATORY_SURGERY_CENTER): Payer: Self-pay

## 2019-06-19 VITALS — Temp 97.4°F | Ht 60.0 in | Wt 128.0 lb

## 2019-06-19 DIAGNOSIS — Z8719 Personal history of other diseases of the digestive system: Secondary | ICD-10-CM

## 2019-06-19 NOTE — Progress Notes (Signed)
Per pt, no allergies to soy or egg products.Pt not taking any weight loss meds or using  O2 at home.  Pt does not have email, but was informed to watch endoscopy on youtube.  Pt denies sedation problems.

## 2019-06-21 ENCOUNTER — Encounter: Payer: Self-pay | Admitting: Internal Medicine

## 2019-06-29 ENCOUNTER — Telehealth: Payer: Self-pay | Admitting: Internal Medicine

## 2019-06-29 NOTE — Telephone Encounter (Signed)
Spoke with patient regarding Covid-19 screening questions. Covid-19 Screening Questions:  Do you now or have you had a fever in the last 14 days? no  Do you have any respiratory symptoms of shortness of breath or cough now or in the last 14 days? no  Do you have any family members or close contacts with diagnosed or suspected Covid-19 in the past 14 days? no  Have you been tested for Covid-19 and found to be positive? no  Pt made aware of that care partner may wait in the car or come up to the lobby during the procedure but will need to provide their own mask.

## 2019-07-02 ENCOUNTER — Encounter: Payer: Self-pay | Admitting: Internal Medicine

## 2019-07-02 ENCOUNTER — Ambulatory Visit (AMBULATORY_SURGERY_CENTER): Payer: Medicare PPO | Admitting: Internal Medicine

## 2019-07-02 ENCOUNTER — Other Ambulatory Visit: Payer: Self-pay

## 2019-07-02 VITALS — BP 98/60 | HR 73 | Temp 96.2°F | Resp 18 | Ht 60.5 in | Wt 123.0 lb

## 2019-07-02 DIAGNOSIS — K449 Diaphragmatic hernia without obstruction or gangrene: Secondary | ICD-10-CM

## 2019-07-02 DIAGNOSIS — K2970 Gastritis, unspecified, without bleeding: Secondary | ICD-10-CM

## 2019-07-02 DIAGNOSIS — K317 Polyp of stomach and duodenum: Secondary | ICD-10-CM

## 2019-07-02 DIAGNOSIS — K3189 Other diseases of stomach and duodenum: Secondary | ICD-10-CM

## 2019-07-02 DIAGNOSIS — Z8719 Personal history of other diseases of the digestive system: Secondary | ICD-10-CM

## 2019-07-02 DIAGNOSIS — K219 Gastro-esophageal reflux disease without esophagitis: Secondary | ICD-10-CM | POA: Diagnosis not present

## 2019-07-02 MED ORDER — SODIUM CHLORIDE 0.9 % IV SOLN: 500.0000 mL | Freq: Once | INTRAVENOUS | Status: DC

## 2019-07-02 NOTE — Progress Notes (Signed)
Teeth unchanged after procedure.A and O x3. Report to RN. Tolerated MAC anesthesia well. 

## 2019-07-02 NOTE — Patient Instructions (Signed)
Information on hiatal hernias given to you today.  Await pathology results.  Follow up appointment with Dr. Hilarie Fredrickson ASAP.  YOU HAD AN ENDOSCOPIC PROCEDURE TODAY AT [facility]:   Refer to the procedure report that was given to you for any specific questions about what was found during the examination.  If the procedure report does not answer your questions, please call your gastroenterologist to clarify.  If you requested that your care partner not be given the details of your procedure findings, then the procedure report has been included in a sealed envelope for you to review at your convenience later.  YOU SHOULD EXPECT: Some feelings of bloating in the abdomen. Passage of more gas than usual.  Walking can help get rid of the air that was put into your GI tract during the procedure and reduce the bloating. If you had a lower endoscopy (such as a colonoscopy or flexible sigmoidoscopy) you may notice spotting of blood in your stool or on the toilet paper. If you underwent a bowel prep for your procedure, you may not have a normal bowel movement for a few days.  Please Note:  You might notice some irritation and congestion in your nose or some drainage.  This is from the oxygen used during your procedure.  There is no need for concern and it should clear up in a day or so.  SYMPTOMS TO REPORT IMMEDIATELY:   Following upper endoscopy (EGD)  Vomiting of blood or coffee ground material  New chest pain or pain under the shoulder blades  Painful or persistently difficult swallowing  New shortness of breath  Fever of 100F or higher  Black, tarry-looking stools  For urgent or emergent issues, a gastroenterologist can be reached at any hour by calling [phone].   DIET:  We do recommend a small meal at first, but then you may proceed to your regular diet.  Drink plenty of fluids but you should avoid alcoholic beverages for 24 hours.  ACTIVITY:  You should plan to take it  easy for the rest of today and you should NOT DRIVE or use heavy machinery until tomorrow (because of the sedation medicines used during the test).    FOLLOW UP: Our staff will call the number listed on your records 48-72 hours following your procedure to check on you and address any questions or concerns that you may have regarding the information given to you following your procedure. If we do not reach you, we will leave a message.  We will attempt to reach you two times.  During this call, we will ask if you have developed any symptoms of COVID 19. If you develop any symptoms (ie: fever, flu-like symptoms, shortness of breath, cough etc.) before then, please call [phone].  If you test positive for Covid 19 in the 2 weeks post procedure, please call and report this information to [place].    If any biopsies were taken you will be contacted by phone or by letter within the next 1-3 weeks.  Please call us at [phone] if you have not heard about the biopsies in 3 weeks.    SIGNATURES/CONFIDENTIALITY: You and/or your care partner have signed paperwork which will be entered into your electronic medical record.  These signatures attest to the fact that that the information above on your After Visit Summary has been reviewed and is understood.  Full responsibility of the confidentiality of this discharge information lies with you and/or your care-partner.

## 2019-07-02 NOTE — Op Note (Signed)
[facility] Patient Name: Erica Carter Procedure Date: 07/02/2019 8:45 AM MRN: [id number] Endoscopist: Jerene Bears , MD Age: 84 Referring MD:  Date of Birth: 1935-08-23 Gender: Female Account #: [id number] Procedure:                Upper GI endoscopy Indications:              Gastro-esophageal reflux disease, frequent                            regurgitation despite BID PPI and anti-reflux                            measures Medicines:                Monitored Anesthesia Care Procedure:                Pre-Anesthesia Assessment:                           - Prior to the procedure, a History and Physical                            was performed, and patient medications and                            allergies were reviewed. The patient's tolerance of                            previous anesthesia was also reviewed. The risks                            and benefits of the procedure and the sedation                            options and risks were discussed with the patient.                            All questions were answered, and informed consent                            was obtained. Prior Anticoagulants: The patient has                            taken no previous anticoagulant or antiplatelet                            agents. ASA Grade Assessment: II - A patient with                            mild systemic disease. After reviewing the risks                            and benefits, the patient was deemed in  satisfactory condition to undergo the procedure.                           After obtaining informed consent, the endoscope was                            passed under direct vision. Throughout the                            procedure, the patient's blood pressure, pulse, and                            oxygen saturations were monitored continuously. The                            Endoscope was introduced through the mouth, and                  advanced to the second part of duodenum. The upper                            GI endoscopy was accomplished without difficulty.                            The patient tolerated the procedure well. Scope In: Scope Out: Findings:                 The lower third of the esophagus was mildly                            tortuous.                           A 1 cm hiatal hernia was present.                           The gastroesophageal flap valve was visualized                            endoscopically and classified as Hill Grade III                            (minimal fold, loose to endoscope, hiatal hernia                            likely).                           Moderate inflammation characterized by erosions and                            erythema was found in the gastric antrum and in the                            prepyloric region of the stomach. Biopsies were  taken with a cold forceps for histology and                            Helicobacter pylori testing (body, antrum).                           A single 8 mm pedunculated polyp with no bleeding                            was found at the incisura. This appears                            inflammatory and benign (and of note was present in                            2011 during EGD with Dr. Sharlett Iles). Biopsies were                            taken with a cold forceps for histology.                           The examined duodenum was normal. Complications:            No immediate complications. Estimated Blood Loss:     Estimated blood loss was minimal. Impression:               - 1 cm hiatal hernia. Gastroesophageal flap valve                            classified as Hill Grade III (minimal fold, loose                            to endoscope, hiatal hernia likely).                           - Gastritis. Biopsied.                           - A single gastric polyp. Biopsied.                            - Normal examined duodenum. Recommendation:           - Patient has a contact number available for                            emergencies. The signs and symptoms of potential                            delayed complications were discussed with the                            patient. Return to normal activities tomorrow.  Written discharge instructions were provided to the                            patient.                           - Resume previous diet.                           - Continue present medications.                           - Await pathology results.                           - Given frequent and symptomatic regurgitation                            (worse at night) continue BID PPI. Will discuss                            surgical intervention such as Nissen or TIF (but                            would need repeat barium esophagram and likely                            manometry prior to anti-reflux procedure/surgery).                            Could consider metoclopramide at bedtime, but would                            need to monitor closely given risk of side-effects.                           - Next available office follow-up appointment with                            me (virtual or in-person). Jerene Bears, MD 07/02/2019 9:05:52 AM This report has been signed electronically.

## 2019-07-02 NOTE — Progress Notes (Signed)
Called to room to assist during endoscopic procedure.  Patient ID and intended procedure confirmed with present staff. Received instructions for my participation in the procedure from the performing physician.  

## 2019-07-03 ENCOUNTER — Other Ambulatory Visit: Payer: Self-pay

## 2019-07-03 ENCOUNTER — Telehealth: Payer: Self-pay

## 2019-07-03 DIAGNOSIS — K219 Gastro-esophageal reflux disease without esophagitis: Secondary | ICD-10-CM

## 2019-07-03 NOTE — Telephone Encounter (Signed)
-----   Message from Jerene Bears, MD sent at 07/02/2019  9:26 AM EDT ----- Needs: 1. Derm referral ([facility]) for a skin lesion on her upper right anterior chest, non-healing and need to exclude skin cancer  2. Barium esophagram -- possible TIF patient, eval esophageal motility and try to document reflux  Thanks JMP

## 2019-07-03 NOTE — Telephone Encounter (Signed)
Pt scheduled for barium esophagram at [facility] 07/09/19@9 :30am, pt to arrive there at 9:15am, pt to be NPO after midnight. Pt scheduled to see Dr. Martin Majestic with [facility] 07/10/19@10 :30am. Pt aware of appts and instructions.

## 2019-07-04 ENCOUNTER — Telehealth: Payer: Self-pay | Admitting: *Deleted

## 2019-07-04 NOTE — Telephone Encounter (Signed)
Second follow up call attempt.  No answer.

## 2019-07-04 NOTE — Telephone Encounter (Signed)
First follow up call attempt.  Reached busy signal multiple attempts.

## 2019-07-05 DIAGNOSIS — E538 Deficiency of other specified B group vitamins: Secondary | ICD-10-CM | POA: Diagnosis not present

## 2019-07-09 ENCOUNTER — Ambulatory Visit (HOSPITAL_COMMUNITY)
Admission: RE | Admit: 2019-07-09 | Discharge: 2019-07-09 | Disposition: A | Discharge status: Home / Self Care | Payer: Medicare PPO | Source: Ambulatory Visit | Attending: Internal Medicine | Admitting: Internal Medicine

## 2019-07-09 ENCOUNTER — Other Ambulatory Visit: Payer: Self-pay

## 2019-07-09 DIAGNOSIS — K219 Gastro-esophageal reflux disease without esophagitis: Secondary | ICD-10-CM | POA: Diagnosis not present

## 2019-07-09 DIAGNOSIS — K224 Dyskinesia of esophagus: Secondary | ICD-10-CM | POA: Diagnosis not present

## 2019-07-10 DIAGNOSIS — L821 Other seborrheic keratosis: Secondary | ICD-10-CM | POA: Diagnosis not present

## 2019-07-10 DIAGNOSIS — C4441 Basal cell carcinoma of skin of scalp and neck: Secondary | ICD-10-CM | POA: Diagnosis not present

## 2019-07-10 DIAGNOSIS — D692 Other nonthrombocytopenic purpura: Secondary | ICD-10-CM | POA: Diagnosis not present

## 2019-07-10 DIAGNOSIS — Z85828 Personal history of other malignant neoplasm of skin: Secondary | ICD-10-CM | POA: Diagnosis not present

## 2019-07-10 DIAGNOSIS — C44529 Squamous cell carcinoma of skin of other part of trunk: Secondary | ICD-10-CM | POA: Diagnosis not present

## 2019-07-10 DIAGNOSIS — D0372 Melanoma in situ of left lower limb, including hip: Secondary | ICD-10-CM | POA: Diagnosis not present

## 2019-07-12 ENCOUNTER — Other Ambulatory Visit: Payer: Self-pay | Admitting: Internal Medicine

## 2019-07-12 NOTE — Progress Notes (Signed)
Ba swallow and chart reviewed She had an EGD on 8/20 and no esophageal stricture seen so does not need an EGD now  It would appear that she has an esophageal dysmotility problem causing/contributing to her symptoms  Find out how she is doing with sxs of late  Re:  Regurgitation/sourbrash Any dysphagia

## 2019-07-13 NOTE — Progress Notes (Signed)
Dr. Hilarie Fredrickson was thinking of a manometry possibly and I would have her do that if she is willing Esophageal spasm could be at play  So would do that next

## 2019-07-23 ENCOUNTER — Encounter: Payer: Self-pay | Admitting: *Deleted

## 2019-07-24 ENCOUNTER — Telehealth: Payer: Self-pay | Admitting: *Deleted

## 2019-07-24 DIAGNOSIS — K224 Dyskinesia of esophagus: Secondary | ICD-10-CM

## 2019-07-24 MED ORDER — MESALAMINE ER 0.375 G PO CP24: 1.5000 g | ORAL_CAPSULE | Freq: Every day | ORAL | 0 refills | Status: DC

## 2019-07-24 NOTE — Telephone Encounter (Signed)
-----   Message from Jerene Bears, MD sent at 07/20/2019 12:25 PM EDT ----- Agree with Virgel Gess as recommended by Dr. Carlean Purl Thanks

## 2019-07-24 NOTE — Telephone Encounter (Signed)
Pt returned your call, pls call her again.

## 2019-07-24 NOTE — Telephone Encounter (Addendum)
Patient has agreed to go forward with esophageal manometry. She has been scheduled for [facility] on 07/30/19 at 10 am, [facility]. I have also sent a prescription for Apriso to [facility] on [address] as per patient request since she says she is no longer using [facility] in pharmacy. In addition, I have cancelled Ms.Monaco's appointment for tomorrow as she will not need it until after we receive manometry testing results per Dr Hilarie Fredrickson.  I have left a message for patient to call back so I can go over her manometry instructions and COVID prescreen which is scheduled for 07/26/19 at 2:05pm.

## 2019-07-24 NOTE — Telephone Encounter (Signed)
Patient has been rescheduled to esophageal manometry at [facility] on 08/08/2019 at 1230 pm and COVID screen on 08/04/19 at 1150 am as the 9/21 date was inconvenient. I have spoken to patient to advise of new appointment time, date, location and prep. She verbalizes understanding. I have also advised her of covid testing. I have sent written instructions to her home address as well.

## 2019-07-25 ENCOUNTER — Ambulatory Visit: Payer: Medicare PPO | Admitting: Internal Medicine

## 2019-07-26 ENCOUNTER — Other Ambulatory Visit (HOSPITAL_COMMUNITY): Payer: Medicare PPO

## 2019-07-26 DIAGNOSIS — N6041 Mammary duct ectasia of right breast: Secondary | ICD-10-CM | POA: Diagnosis not present

## 2019-07-27 DIAGNOSIS — D519 Vitamin B12 deficiency anemia, unspecified: Secondary | ICD-10-CM | POA: Diagnosis not present

## 2019-07-27 DIAGNOSIS — N183 Chronic kidney disease, stage 3 (moderate): Secondary | ICD-10-CM | POA: Diagnosis not present

## 2019-07-27 DIAGNOSIS — E1122 Type 2 diabetes mellitus with diabetic chronic kidney disease: Secondary | ICD-10-CM | POA: Diagnosis not present

## 2019-07-27 DIAGNOSIS — E782 Mixed hyperlipidemia: Secondary | ICD-10-CM | POA: Diagnosis not present

## 2019-07-28 ENCOUNTER — Other Ambulatory Visit (HOSPITAL_COMMUNITY): Payer: Medicare PPO

## 2019-07-30 DIAGNOSIS — E1122 Type 2 diabetes mellitus with diabetic chronic kidney disease: Secondary | ICD-10-CM | POA: Diagnosis not present

## 2019-07-30 DIAGNOSIS — K5190 Ulcerative colitis, unspecified, without complications: Secondary | ICD-10-CM | POA: Diagnosis not present

## 2019-07-30 DIAGNOSIS — D0372 Melanoma in situ of left lower limb, including hip: Secondary | ICD-10-CM | POA: Diagnosis not present

## 2019-07-30 DIAGNOSIS — N183 Chronic kidney disease, stage 3 (moderate): Secondary | ICD-10-CM | POA: Diagnosis not present

## 2019-07-30 DIAGNOSIS — Z0000 Encounter for general adult medical examination without abnormal findings: Secondary | ICD-10-CM | POA: Diagnosis not present

## 2019-07-30 DIAGNOSIS — Z85828 Personal history of other malignant neoplasm of skin: Secondary | ICD-10-CM | POA: Diagnosis not present

## 2019-07-30 DIAGNOSIS — D692 Other nonthrombocytopenic purpura: Secondary | ICD-10-CM | POA: Diagnosis not present

## 2019-07-30 DIAGNOSIS — G4700 Insomnia, unspecified: Secondary | ICD-10-CM | POA: Diagnosis not present

## 2019-07-30 DIAGNOSIS — L814 Other melanin hyperpigmentation: Secondary | ICD-10-CM | POA: Diagnosis not present

## 2019-07-30 DIAGNOSIS — K219 Gastro-esophageal reflux disease without esophagitis: Secondary | ICD-10-CM | POA: Diagnosis not present

## 2019-07-30 DIAGNOSIS — L7682 Other postprocedural complications of skin and subcutaneous tissue: Secondary | ICD-10-CM | POA: Diagnosis not present

## 2019-07-30 DIAGNOSIS — E782 Mixed hyperlipidemia: Secondary | ICD-10-CM | POA: Diagnosis not present

## 2019-07-30 DIAGNOSIS — I129 Hypertensive chronic kidney disease with stage 1 through stage 4 chronic kidney disease, or unspecified chronic kidney disease: Secondary | ICD-10-CM | POA: Diagnosis not present

## 2019-08-04 ENCOUNTER — Other Ambulatory Visit (HOSPITAL_COMMUNITY)
Admission: RE | Admit: 2019-08-04 | Discharge: 2019-08-04 | Disposition: A | Discharge status: Home / Self Care | Payer: Medicare PPO | Source: Ambulatory Visit | Attending: Gastroenterology | Admitting: Gastroenterology

## 2019-08-04 DIAGNOSIS — Z20828 Contact with and (suspected) exposure to other viral communicable diseases: Secondary | ICD-10-CM | POA: Insufficient documentation

## 2019-08-04 DIAGNOSIS — Z01812 Encounter for preprocedural laboratory examination: Secondary | ICD-10-CM | POA: Diagnosis not present

## 2019-08-05 LAB — NOVEL CORONAVIRUS, NAA (HOSP ORDER, SEND-OUT TO REF LAB; TAT 18-24 HRS): SARS-CoV-2, NAA: NOT DETECTED

## 2019-08-06 DIAGNOSIS — Z85828 Personal history of other malignant neoplasm of skin: Secondary | ICD-10-CM | POA: Diagnosis not present

## 2019-08-06 DIAGNOSIS — C44529 Squamous cell carcinoma of skin of other part of trunk: Secondary | ICD-10-CM | POA: Diagnosis not present

## 2019-08-08 ENCOUNTER — Encounter (HOSPITAL_COMMUNITY)
Admission: RE | Disposition: A | Discharge status: Home / Self Care | Payer: Self-pay | Source: Home / Self Care | Attending: Gastroenterology

## 2019-08-08 ENCOUNTER — Encounter (HOSPITAL_COMMUNITY): Payer: Self-pay | Admitting: Gastroenterology

## 2019-08-08 ENCOUNTER — Ambulatory Visit (HOSPITAL_COMMUNITY)
Admission: RE | Admit: 2019-08-08 | Discharge: 2019-08-08 | Disposition: A | Discharge status: Home / Self Care | Payer: Medicare PPO | Attending: Gastroenterology | Admitting: Gastroenterology

## 2019-08-08 DIAGNOSIS — K219 Gastro-esophageal reflux disease without esophagitis: Secondary | ICD-10-CM

## 2019-08-08 DIAGNOSIS — Z01818 Encounter for other preprocedural examination: Secondary | ICD-10-CM | POA: Insufficient documentation

## 2019-08-08 HISTORY — PX: ESOPHAGEAL MANOMETRY: SHX5429

## 2019-08-08 SURGERY — MANOMETRY, ESOPHAGUS

## 2019-08-08 MED ORDER — LIDOCAINE VISCOUS HCL 2 % MT SOLN
OROMUCOSAL | Status: AC
  Filled 2019-08-08: qty 15

## 2019-08-08 SURGICAL SUPPLY — 2 items
FACESHIELD LNG OPTICON STERILE (SAFETY) IMPLANT
GLOVE BIO SURGEON STRL SZ8 (GLOVE) ×4 IMPLANT

## 2019-08-08 NOTE — Progress Notes (Signed)
Esophageal Manometry done per protocol. Pt tolerated well without distress or complication.

## 2019-08-15 DIAGNOSIS — H52203 Unspecified astigmatism, bilateral: Secondary | ICD-10-CM | POA: Diagnosis not present

## 2019-08-15 DIAGNOSIS — E119 Type 2 diabetes mellitus without complications: Secondary | ICD-10-CM | POA: Diagnosis not present

## 2019-08-15 DIAGNOSIS — Z961 Presence of intraocular lens: Secondary | ICD-10-CM | POA: Diagnosis not present

## 2019-08-16 ENCOUNTER — Telehealth: Payer: Self-pay | Admitting: Internal Medicine

## 2019-08-16 NOTE — Telephone Encounter (Signed)
No significant esophageal motility disorder or abnormality based on manometry.  I interpreted the study and finalized report last week.  I will check and follow-up why it has not been scanned.  Thank you

## 2019-08-16 NOTE — Telephone Encounter (Signed)
Pt has questions about a procedure that she had done.

## 2019-08-16 NOTE — Telephone Encounter (Signed)
Spoke with pt and she is aware. Pt wanted to know if she needed to continue taking her omeprazole. Discussed with pt that she does need to continue this for reflux. Pt aware and knows to follow up in April or before if needed.

## 2019-08-16 NOTE — Telephone Encounter (Signed)
Left message for pt to call back  °

## 2019-08-16 NOTE — Telephone Encounter (Signed)
Pt calling asking about E mano results. Please advise.

## 2019-08-16 NOTE — Telephone Encounter (Signed)
I have not seen a report as of yet Please let the patient know I will let her know soon Thanks

## 2019-08-16 NOTE — Telephone Encounter (Signed)
She is having freq regurgitation despite BID PPI.  I did the barium esophagram and mano bc we had discussed TIF This is the endoscopic procedure to control reflux.   If she would like to discuss this procedure, which would require 1 night in the hospital, please make her an appt to see Dr. Baker Janus.

## 2019-08-17 NOTE — Telephone Encounter (Signed)
Pt scheduled to see Dr. Bryan Lemma 08/24/19@8 :40am to discuss possible TIF procedure. Pt aware of appt.

## 2019-08-20 DIAGNOSIS — C4441 Basal cell carcinoma of skin of scalp and neck: Secondary | ICD-10-CM | POA: Diagnosis not present

## 2019-08-20 DIAGNOSIS — Z85828 Personal history of other malignant neoplasm of skin: Secondary | ICD-10-CM | POA: Diagnosis not present

## 2019-08-24 ENCOUNTER — Other Ambulatory Visit: Payer: Self-pay

## 2019-08-24 ENCOUNTER — Ambulatory Visit (INDEPENDENT_AMBULATORY_CARE_PROVIDER_SITE_OTHER): Payer: Medicare PPO | Admitting: Gastroenterology

## 2019-08-24 ENCOUNTER — Encounter: Payer: Self-pay | Admitting: Gastroenterology

## 2019-08-24 VITALS — BP 120/70 | HR 80 | Temp 98.2°F | Ht 60.0 in | Wt 117.5 lb

## 2019-08-24 DIAGNOSIS — K5909 Other constipation: Secondary | ICD-10-CM | POA: Diagnosis not present

## 2019-08-24 DIAGNOSIS — Z8719 Personal history of other diseases of the digestive system: Secondary | ICD-10-CM | POA: Diagnosis not present

## 2019-08-24 DIAGNOSIS — K219 Gastro-esophageal reflux disease without esophagitis: Secondary | ICD-10-CM

## 2019-08-24 DIAGNOSIS — D519 Vitamin B12 deficiency anemia, unspecified: Secondary | ICD-10-CM | POA: Diagnosis not present

## 2019-08-24 DIAGNOSIS — K449 Diaphragmatic hernia without obstruction or gangrene: Secondary | ICD-10-CM

## 2019-08-24 DIAGNOSIS — K5100 Ulcerative (chronic) pancolitis without complications: Secondary | ICD-10-CM

## 2019-08-24 DIAGNOSIS — Z23 Encounter for immunization: Secondary | ICD-10-CM | POA: Diagnosis not present

## 2019-08-24 DIAGNOSIS — K224 Dyskinesia of esophagus: Secondary | ICD-10-CM

## 2019-08-24 NOTE — Patient Instructions (Signed)
Due to recent COVID-19 restrictions implemented by our local and state authorities and in an effort to keep both patients and staff as safe as possible, our hospital system now requires COVID-19 testing prior to any scheduled hospital procedure. Please go to our [facility] location drive thru testing site ([address], [place],  [address]) on 09/04/2019 at  9:20am. There will be multiple testing areas, the first checkpoint being for pre-procedure/surgery testing. Get into the right (yellow) lane that leads to the PAT testing team. You will not be billed at the time of testing but may receive a bill later depending on your insurance. The approximate cost of the test is $100. You must agree to quarantine from the time of your testing until the procedure date on 09/07/2019 . This should include staying at home with ONLY the people you live with. Avoid take-out, grocery store shopping or leaving the house for any non-emergent reason. Failure to have your COVID-19 test done on the date and time you have been scheduled will result in cancellation of procedure. Please call our office at [phone] if you have any questions.   You have been scheduled for an endoscopy. Please follow written instructions given to you at your visit today. If you use inhalers (even only as needed), please bring them with you on the day of your procedure.

## 2019-08-24 NOTE — Progress Notes (Signed)
P  Chief Complaint:    GERD, hiatal hernia  Referring Physician: Zenovia Jarred, MD  GI History: Erica Carter is an 84 year old female with a longstanding history of reflux, referred to me for evaluation of Transoral Incisionless Fundoplication (TIF) as a means to better control her reflux and potentially stop or significantly reduce acid suppression therapy requirements.  Index reflux symptoms of regurgitation, sour brash.  Not much heartburn.  No dysphagia or odynophagia.  Symptoms can occur during the day or night.  Continued to have increasingly frequent breakthrough symptoms despite pantoprazole 40 mg/day, prompting change to omeprazole 40 mg/day in 02/2019, then increase to bid dosing given ongoing breakthrough.  She continued to have ongoing reflux symptoms despite high-dose PPI.    Sleeps elevated on couch due to nocturnal regurgitation.  Avoids eating within 3 hours of bedtime.  Aside from tomato based foods, no other exacerbating factors.  Reflux evaluation to date: -Barium esophagram (06/2019): Apparent distal esophageal narrowing w/o passage of 13 mm barium tablet, decrease esophageal motility.  No refluxate -EGD (06/2019, Dr. Hilarie Fredrickson): 1 cm HH, mildly tortuous lower esophagus, moderate gastritis, inflammatory polyp -Esophageal Manometry (07/2019): Normal. No HH -EGD (09/2010): 3 cm HH, otherwise normal esophagus, antral inflammatory polyp, peptic stricture dilated with 18 mm Maloney -EGD (05/2008): Esophageal rings dilated with 53 French Maloney, antral polyp  HPI:    Patient is a 84 y.o. female presenting to the Gastroenterology Clinic for evaluation of TIF as above.  Reviewed her reflux history at length as above.  She continues to have ongoing reflux symptoms of regurgitation and sour brash despite high-dose PPI therapy.  Presents to clinic today with her daughter.  Review of systems:     No chest pain, no SOB, no fevers, no urinary sx   Past Medical History:  Diagnosis Date   . Acute gastritis without mention of hemorrhage   . Diverticulosis of colon (without mention of hemorrhage)   . Esophageal dysmotilities   . Esophageal reflux   . Hiatal hernia   . Other and unspecified hyperlipidemia   . Personal history of colonic polyps 09/11/2010   hyperplasic  . Stricture and stenosis of esophagus   . Ulcerative colitis, unspecified   . Unspecified essential hypertension     Patient's surgical history, family medical history, social history, medications and allergies were all reviewed in Epic    Current Outpatient Medications  Medication Sig Dispense Refill  . amLODipine (NORVASC) 5 MG tablet Take 5 mg by mouth daily.    Marland Kitchen aspirin 81 MG tablet Take 81 mg by mouth daily.    Marland Kitchen atorvastatin (LIPITOR) 20 MG tablet Take 20 mg by mouth daily.     . calcium gluconate 500 MG tablet Take 500 mg by mouth daily.      . cholecalciferol (VITAMIN D) 1000 UNITS tablet Take 1,000 Units by mouth daily.      . Cyanocobalamin (VITAMIN B-12 IJ) Inject as directed. Pt gets shot once a month    . losartan (COZAAR) 100 MG tablet Take 100 mg by mouth daily.      . mesalamine (APRISO) 0.375 g 24 hr capsule Take 4 capsules (1.5 g total) by mouth daily. 360 capsule 0  . Misc Natural Products (COLON CARE PO) Take 1 tablet by mouth as needed.     . Multiple Vitamins-Minerals (CENTRUM SILVER PO) Take 1 tablet by mouth daily.      Marland Kitchen omeprazole (PRILOSEC) 40 MG capsule Take 1 capsule (40 mg total) by mouth  2 (two) times daily. 60 capsule 3  . vitamin B-12 (CYANOCOBALAMIN) 500 MCG tablet Take 500 mcg by mouth daily.     No current facility-administered medications for this visit.     Physical Exam:     There were no vitals taken for this visit.  GENERAL:  Pleasant female in NAD PSYCH: : Cooperative, normal affect EENT:  conjunctiva pink, mucous membranes moist, neck supple without masses CARDIAC:  RRR, no murmur heard, no peripheral edema PULM: Normal respiratory effort, lungs CTA  bilaterally, no wheezing ABDOMEN:  Nondistended, soft, nontender. No obvious masses, no hepatomegaly,  normal bowel sounds SKIN:  turgor, no lesions seen Musculoskeletal:  Normal muscle tone, normal strength NEURO: Alert and oriented x 3, no focal neurologic deficits   IMPRESSION and PLAN:    1) GERD 2) Hiatal hernia  Erica Carter is a 84 y.o. female referred to me to discuss GERD and treatment options. Discussed the pathophysiology of GERD at length, to include the risks of untreated reflux (ie, strictures, Barrett's Esophagus, EAC, etc) as well as the possible treatment with medications vs antireflux surgery. In particular, we discussed the risks, benefits, and alternatives of Transoral Incisionless Fundoplication (TIF), to include Nissen fundoplication and Linx, and the patient wishes to proceed with the pre-operative evaluation for TIF. Will proceed as below:   - Resume acid suppression therapy for now  -EGD with Bravo at [facility]. Off PPI and ASA x5 days prior to procedure - Discussed the strict post-procedure diet, to include clears x24 hours then liquids x2 weeks and slow advancement to pureed, thick, then finally previous foods 6 weeks post operatively  - Discussed the activity limitations for the initial 6 weeks post operatively  - Directed patient to informative video regarding the TIF procedure at [email or web address]  - To follow-up with me in the GI clinic following evaluation as above to discuss potential TIF as indicated vs alternate long term treatment strategies - All questions answered  The indications, risks, and benefits of EGD with Bravo placement were explained to the patient in detail. Risks include but are not limited to bleeding, perforation, adverse reaction to medications, and cardiopulmonary compromise. Sequelae include but are not limited to the possibility of surgery, hositalization, and mortality. The patient verbalized understanding and wished to proceed. All  questions answered, referred to scheduler. Further recommendations pending results of the exam.             Lavena Bullion ,DO, FACG 08/24/2019, 8:27 AM

## 2019-08-24 NOTE — H&P (View-Only) (Signed)
P  Chief Complaint:    GERD, hiatal hernia  Referring Physician: Zenovia Jarred, MD  GI History: Fatema Rabe is an 84 year old female with a longstanding history of reflux, referred to me for evaluation of Transoral Incisionless Fundoplication (TIF) as a means to better control her reflux and potentially stop or significantly reduce acid suppression therapy requirements.  Index reflux symptoms of regurgitation, sour brash.  Not much heartburn.  No dysphagia or odynophagia.  Symptoms can occur during the day or night.  Continued to have increasingly frequent breakthrough symptoms despite pantoprazole 40 mg/day, prompting change to omeprazole 40 mg/day in 02/2019, then increase to bid dosing given ongoing breakthrough.  She continued to have ongoing reflux symptoms despite high-dose PPI.    Sleeps elevated on couch due to nocturnal regurgitation.  Avoids eating within 3 hours of bedtime.  Aside from tomato based foods, no other exacerbating factors.  Reflux evaluation to date: -Barium esophagram (06/2019): Apparent distal esophageal narrowing w/o passage of 13 mm barium tablet, decrease esophageal motility.  No refluxate -EGD (06/2019, Dr. Hilarie Fredrickson): 1 cm HH, mildly tortuous lower esophagus, moderate gastritis, inflammatory polyp -Esophageal Manometry (07/2019): Normal. No HH -EGD (09/2010): 3 cm HH, otherwise normal esophagus, antral inflammatory polyp, peptic stricture dilated with 18 mm Maloney -EGD (05/2008): Esophageal rings dilated with 32 French Maloney, antral polyp  HPI:    Patient is a 84 y.o. female presenting to the Gastroenterology Clinic for evaluation of TIF as above.  Reviewed her reflux history at length as above.  She continues to have ongoing reflux symptoms of regurgitation and sour brash despite high-dose PPI therapy.  Presents to clinic today with her daughter.  Review of systems:     No chest pain, no SOB, no fevers, no urinary sx   Past Medical History:  Diagnosis Date   . Acute gastritis without mention of hemorrhage   . Diverticulosis of colon (without mention of hemorrhage)   . Esophageal dysmotilities   . Esophageal reflux   . Hiatal hernia   . Other and unspecified hyperlipidemia   . Personal history of colonic polyps 09/11/2010   hyperplasic  . Stricture and stenosis of esophagus   . Ulcerative colitis, unspecified   . Unspecified essential hypertension     Patient's surgical history, family medical history, social history, medications and allergies were all reviewed in Epic    Current Outpatient Medications  Medication Sig Dispense Refill  . amLODipine (NORVASC) 5 MG tablet Take 5 mg by mouth daily.    Marland Kitchen aspirin 81 MG tablet Take 81 mg by mouth daily.    Marland Kitchen atorvastatin (LIPITOR) 20 MG tablet Take 20 mg by mouth daily.     . calcium gluconate 500 MG tablet Take 500 mg by mouth daily.      . cholecalciferol (VITAMIN D) 1000 UNITS tablet Take 1,000 Units by mouth daily.      . Cyanocobalamin (VITAMIN B-12 IJ) Inject as directed. Pt gets shot once a month    . losartan (COZAAR) 100 MG tablet Take 100 mg by mouth daily.      . mesalamine (APRISO) 0.375 g 24 hr capsule Take 4 capsules (1.5 g total) by mouth daily. 360 capsule 0  . Misc Natural Products (COLON CARE PO) Take 1 tablet by mouth as needed.     . Multiple Vitamins-Minerals (CENTRUM SILVER PO) Take 1 tablet by mouth daily.      Marland Kitchen omeprazole (PRILOSEC) 40 MG capsule Take 1 capsule (40 mg total) by mouth  2 (two) times daily. 60 capsule 3  . vitamin B-12 (CYANOCOBALAMIN) 500 MCG tablet Take 500 mcg by mouth daily.     No current facility-administered medications for this visit.     Physical Exam:     There were no vitals taken for this visit.  GENERAL:  Pleasant female in NAD PSYCH: : Cooperative, normal affect EENT:  conjunctiva pink, mucous membranes moist, neck supple without masses CARDIAC:  RRR, no murmur heard, no peripheral edema PULM: Normal respiratory effort, lungs CTA  bilaterally, no wheezing ABDOMEN:  Nondistended, soft, nontender. No obvious masses, no hepatomegaly,  normal bowel sounds SKIN:  turgor, no lesions seen Musculoskeletal:  Normal muscle tone, normal strength NEURO: Alert and oriented x 3, no focal neurologic deficits   IMPRESSION and PLAN:    1) GERD 2) Hiatal hernia  BRITAINY KOZUB is a 84 y.o. female referred to me to discuss GERD and treatment options. Discussed the pathophysiology of GERD at length, to include the risks of untreated reflux (ie, strictures, Barrett's Esophagus, EAC, etc) as well as the possible treatment with medications vs antireflux surgery. In particular, we discussed the risks, benefits, and alternatives of Transoral Incisionless Fundoplication (TIF), to include Nissen fundoplication and Linx, and the patient wishes to proceed with the pre-operative evaluation for TIF. Will proceed as below:   - Resume acid suppression therapy for now  -EGD with Bravo at [facility]. Off PPI and ASA x5 days prior to procedure - Discussed the strict post-procedure diet, to include clears x24 hours then liquids x2 weeks and slow advancement to pureed, thick, then finally previous foods 6 weeks post operatively  - Discussed the activity limitations for the initial 6 weeks post operatively  - Directed patient to informative video regarding the TIF procedure at [email or web address]  - To follow-up with me in the GI clinic following evaluation as above to discuss potential TIF as indicated vs alternate long term treatment strategies - All questions answered  The indications, risks, and benefits of EGD with Bravo placement were explained to the patient in detail. Risks include but are not limited to bleeding, perforation, adverse reaction to medications, and cardiopulmonary compromise. Sequelae include but are not limited to the possibility of surgery, hositalization, and mortality. The patient verbalized understanding and wished to proceed. All  questions answered, referred to scheduler. Further recommendations pending results of the exam.             Lavena Bullion ,DO, FACG 08/24/2019, 8:27 AM

## 2019-08-24 NOTE — Addendum Note (Signed)
Addended by: Audrea Muscat on: 08/24/2019 09:49 AM   Modules accepted: Orders, SmartSet

## 2019-09-04 ENCOUNTER — Other Ambulatory Visit (HOSPITAL_COMMUNITY)
Admission: RE | Admit: 2019-09-04 | Discharge: 2019-09-04 | Disposition: A | Discharge status: Home / Self Care | Payer: Medicare PPO | Source: Ambulatory Visit | Attending: Internal Medicine | Admitting: Internal Medicine

## 2019-09-04 DIAGNOSIS — Z01812 Encounter for preprocedural laboratory examination: Secondary | ICD-10-CM | POA: Insufficient documentation

## 2019-09-04 DIAGNOSIS — Z20828 Contact with and (suspected) exposure to other viral communicable diseases: Secondary | ICD-10-CM | POA: Diagnosis not present

## 2019-09-05 ENCOUNTER — Encounter (HOSPITAL_COMMUNITY): Payer: Self-pay

## 2019-09-05 ENCOUNTER — Other Ambulatory Visit: Payer: Self-pay

## 2019-09-05 LAB — NOVEL CORONAVIRUS, NAA (HOSP ORDER, SEND-OUT TO REF LAB; TAT 18-24 HRS): SARS-CoV-2, NAA: NOT DETECTED

## 2019-09-05 NOTE — Progress Notes (Signed)
SPOKE W/  Deshawn     SCREENING SYMPTOMS OF COVID 19:   COUGH--NO  RUNNY NOSE--- NO  SORE THROAT---NO  NASAL CONGESTION----NO  SNEEZING----NO  SHORTNESS OF BREATH---NO  DIFFICULTY BREATHING---NO  TEMP >100.0 -----NO  UNEXPLAINED BODY ACHES------NO  CHILLS -------- NO  HEADACHES ---------NO  LOSS OF SMELL/ TASTE --------NO    HAVE YOU OR ANY FAMILY MEMBER TRAVELLED PAST 14 DAYS OUT OF THE   COUNTY---Travelled to [facility], [place]----NO COUNTRY----NO  HAVE YOU OR ANY FAMILY MEMBER BEEN EXPOSED TO ANYONE WITH COVID 19? NO

## 2019-09-07 ENCOUNTER — Ambulatory Visit (HOSPITAL_COMMUNITY): Payer: Medicare PPO | Admitting: Certified Registered Nurse Anesthetist

## 2019-09-07 ENCOUNTER — Other Ambulatory Visit: Payer: Self-pay

## 2019-09-07 ENCOUNTER — Ambulatory Visit (HOSPITAL_COMMUNITY)
Admission: RE | Admit: 2019-09-07 | Discharge: 2019-09-07 | Disposition: A | Discharge status: Home / Self Care | Payer: Medicare PPO | Attending: Gastroenterology | Admitting: Gastroenterology

## 2019-09-07 ENCOUNTER — Encounter (HOSPITAL_COMMUNITY)
Admission: RE | Disposition: A | Discharge status: Home / Self Care | Payer: Self-pay | Source: Home / Self Care | Attending: Gastroenterology

## 2019-09-07 ENCOUNTER — Encounter (HOSPITAL_COMMUNITY): Payer: Self-pay | Admitting: *Deleted

## 2019-09-07 DIAGNOSIS — Z87891 Personal history of nicotine dependence: Secondary | ICD-10-CM | POA: Insufficient documentation

## 2019-09-07 DIAGNOSIS — Z79899 Other long term (current) drug therapy: Secondary | ICD-10-CM | POA: Diagnosis not present

## 2019-09-07 DIAGNOSIS — Z7982 Long term (current) use of aspirin: Secondary | ICD-10-CM | POA: Insufficient documentation

## 2019-09-07 DIAGNOSIS — M1990 Unspecified osteoarthritis, unspecified site: Secondary | ICD-10-CM | POA: Insufficient documentation

## 2019-09-07 DIAGNOSIS — K449 Diaphragmatic hernia without obstruction or gangrene: Secondary | ICD-10-CM | POA: Diagnosis not present

## 2019-09-07 DIAGNOSIS — K279 Peptic ulcer, site unspecified, unspecified as acute or chronic, without hemorrhage or perforation: Secondary | ICD-10-CM | POA: Diagnosis not present

## 2019-09-07 DIAGNOSIS — I10 Essential (primary) hypertension: Secondary | ICD-10-CM | POA: Insufficient documentation

## 2019-09-07 DIAGNOSIS — K219 Gastro-esophageal reflux disease without esophagitis: Secondary | ICD-10-CM | POA: Insufficient documentation

## 2019-09-07 DIAGNOSIS — E118 Type 2 diabetes mellitus with unspecified complications: Secondary | ICD-10-CM | POA: Insufficient documentation

## 2019-09-07 DIAGNOSIS — E785 Hyperlipidemia, unspecified: Secondary | ICD-10-CM | POA: Diagnosis not present

## 2019-09-07 DIAGNOSIS — K317 Polyp of stomach and duodenum: Secondary | ICD-10-CM | POA: Diagnosis not present

## 2019-09-07 DIAGNOSIS — Q399 Congenital malformation of esophagus, unspecified: Secondary | ICD-10-CM | POA: Diagnosis not present

## 2019-09-07 DIAGNOSIS — Z8719 Personal history of other diseases of the digestive system: Secondary | ICD-10-CM | POA: Diagnosis not present

## 2019-09-07 DIAGNOSIS — K2900 Acute gastritis without bleeding: Secondary | ICD-10-CM | POA: Diagnosis not present

## 2019-09-07 DIAGNOSIS — K3189 Other diseases of stomach and duodenum: Secondary | ICD-10-CM | POA: Diagnosis not present

## 2019-09-07 HISTORY — PX: BRAVO PH STUDY: SHX5421

## 2019-09-07 HISTORY — DX: Presence of spectacles and contact lenses: Z97.3

## 2019-09-07 HISTORY — DX: Type 2 diabetes mellitus without complications: E11.9

## 2019-09-07 HISTORY — DX: Unspecified malignant neoplasm of skin, unspecified: C44.90

## 2019-09-07 HISTORY — PX: ESOPHAGOGASTRODUODENOSCOPY (EGD) WITH PROPOFOL: SHX5813

## 2019-09-07 HISTORY — PX: HEMOSTASIS CLIP PLACEMENT: SHX6857

## 2019-09-07 HISTORY — DX: Presence of dental prosthetic device (complete) (partial): Z97.2

## 2019-09-07 HISTORY — PX: POLYPECTOMY: SHX5525

## 2019-09-07 SURGERY — ESOPHAGOGASTRODUODENOSCOPY (EGD) WITH PROPOFOL
Anesthesia: Monitor Anesthesia Care

## 2019-09-07 MED ORDER — LACTATED RINGERS IV SOLN
INTRAVENOUS | Status: DC
  Administered 2019-09-07: 14:00:00 1000 mL via INTRAVENOUS

## 2019-09-07 MED ORDER — LIDOCAINE HCL (CARDIAC) PF 100 MG/5ML IV SOSY
PREFILLED_SYRINGE | INTRAVENOUS | Status: DC | PRN
  Administered 2019-09-07: 100 mg via INTRAVENOUS

## 2019-09-07 MED ORDER — PROPOFOL 500 MG/50ML IV EMUL
INTRAVENOUS | Status: DC | PRN
  Administered 2019-09-07: 100 ug/kg/min via INTRAVENOUS

## 2019-09-07 MED ORDER — PROPOFOL 10 MG/ML IV BOLUS
INTRAVENOUS | Status: DC | PRN
  Administered 2019-09-07: 30 mg via INTRAVENOUS

## 2019-09-07 MED ORDER — SODIUM CHLORIDE 0.9 % IV SOLN: INTRAVENOUS | Status: DC

## 2019-09-07 SURGICAL SUPPLY — 15 items

## 2019-09-07 NOTE — Op Note (Signed)
[facility] Patient Name: Erica Carter Procedure Date: 09/07/2019 MRN: [id number] Attending MD: Gerrit Heck , MD Date of Birth: 1935-09-19 CSN: [id number] Age: 84 Admit Type: Inpatient Procedure:                Upper GI endoscopy Indications:              Heartburn, Esophageal reflux, Preoperative                            assessment, Bravo placement Providers:                Gerrit Heck, MD, Cleda Daub, RN, Lina Sar, Technician, Stephanie [place] CRNA Referring MD:              Medicines:                Monitored Anesthesia Care Complications:            No immediate complications. Estimated Blood Loss:     Estimated blood loss was minimal. Procedure:                Pre-Anesthesia Assessment:                           - Prior to the procedure, a History and Physical                            was performed, and patient medications and                            allergies were reviewed. The patient's tolerance of                            previous anesthesia was also reviewed. The risks                            and benefits of the procedure and the sedation                            options and risks were discussed with the patient.                            All questions were answered, and informed consent                            was obtained. Prior Anticoagulants: The patient has                            taken no previous anticoagulant or antiplatelet                            agents. ASA Grade Assessment: III - A patient with  severe systemic disease. After reviewing the risks                            and benefits, the patient was deemed in                            satisfactory condition to undergo the procedure.                           After obtaining informed consent, the endoscope was                            passed under direct vision. Throughout the        procedure, the patient's blood pressure, pulse, and                            oxygen saturations were monitored continuously. The                            GIF-H190 ([id number]) Olympus gastroscope was                            introduced through the mouth, and advanced to the                            second part of duodenum. The upper GI endoscopy was                            accomplished without difficulty. The patient                            tolerated the procedure well. Scope In: Scope Out: Findings:      The Z-line was regular and was found 37 cm from the incisors. The BRAVO       capsule with delivery system was introduced through the mouth and       advanced into the esophagus, such that the BRAVO pH capsule was       positioned 31 cm from the incisors, which was 6 cm proximal to the GE       junction. Suction was applied to the well of the BRAVO pH capsule to       suck in the adjacent mucosa of the esophagus using the external vacuum       pump for 30 seconds. The BRAVO pH capsule was then deployed by       depressing the plunger on top of the handle to advance the locking pin       into the mucosa, thereby attaching the capsule to the esophagus. The       plunger was then rotated a quarter turn clockwise to release the capsule       from the delivery system. The delivery system was then withdrawn.       Endoscopy was utilized for probe placement and diagnostic evaluation.       The scope was reinserted to evaluate placement of the BRAVO capsule.       Visualization showed the BRAVO capsule to be  in an appropriate position.      The gastroesophageal flap valve was visualized endoscopically and       classified as Hill Grade III (minimal fold, loose to endoscope, hiatal       hernia likely).      A hiatal hernia was present. This measured 1 cm axial height on       anterograde views and 3 cm transverse width on retroflexed views.      The lower third of the esophagus  was mildly tortuous. This was easily       traversed.      A single 15 mm pedunculated polyp with no bleeding was found in the       gastric antrum. The polyp was removed with a hot snare. This was       retrieved with Jabier Mutton net. Resection and retrieval were complete. To       prevent bleeding after the polypectomy, one hemostatic clip was       successfully placed (MR conditional). There was no bleeding at the end       of the procedure.      The gastric fundus, gastric body, incisura and pylorus were normal.      The duodenal bulb, first portion of the duodenum and second portion of       the duodenum were normal. Impression:               - Z-line regular, 37 cm from the incisors.                           - Gastroesophageal flap valve classified as Hill                            Grade III (minimal fold, loose to endoscope, hiatal                            hernia likely).                           - 1 cm axial height/3 cm transverse width hiatal                            hernia.                           - Tortuous esophagus.                           - A single gastric polyp. Resected and retrieved.                            Clip (MR conditional) was placed.                           - Normal gastric fundus, gastric body, incisura and                            pylorus.                           -  Normal duodenal bulb, first portion of the                            duodenum and second portion of the duodenum.                           - The BRAVO pH capsule was positioned 31 cm from                            the incisors, which was 6 cm proximal to the GE                            junction. Moderate Sedation:      Not Applicable - Patient had care per Anesthesia. Recommendation:           - Patient has a contact number available for                            emergencies. The signs and symptoms of potential                            delayed complications were discussed with  the                            patient. Return to normal activities tomorrow.                            Written discharge instructions were provided to the                            patient.                           - Resume previous diet today.                           - Resume aspirin at prior dose tomorrow.                           - Otherwise, can continue present medications as                            scheduled.                           - Resume acid reflux medications in 3 days.                           - Await pathology results.                           - Return to GI clinic at appointment to be                            scheduled. Will plan to review Bravo study results  at that appointment and discuss ongoing management                            of reflux, to include medical management vs                            antrireflux surgery.                           - Based on the endoscopic appearance (significant                            LES laxity with Hill Grade 3 valve), if considering                            antireflux surgery, would favor a combined                            laporoscopic hiatal hernia repair with Transoral                            Incisionless Fundoplication (TIF). Procedure Code(s):        --- Professional ---                           [phone], Esophagogastroduodenoscopy, flexible,                            transoral; with removal of tumor(s), polyp(s), or                            other lesion(s) by snare technique Diagnosis Code(s):        --- Professional ---                           K44.9, Diaphragmatic hernia without obstruction or                            gangrene                           Q39.9, Congenital malformation of esophagus,                            unspecified                           K31.7, Polyp of stomach and duodenum                           R12, Heartburn                            K21.9, Gastro-esophageal reflux disease without                            esophagitis  Z47.689, Encounter for other preprocedural                            examination CPT copyright 2019 American Medical Association. All rights reserved. The codes documented in this report are preliminary and upon coder review may  be revised to meet current compliance requirements. Gerrit Heck, MD 09/07/2019 3:22:00 PM Number of Addenda: 0

## 2019-09-07 NOTE — Anesthesia Preprocedure Evaluation (Signed)
Anesthesia Evaluation  Patient identified by MRN, date of birth, ID band Patient awake    Reviewed: Allergy & Precautions, NPO status , Patient's Chart, lab work & pertinent test results  Airway Mallampati: II  TM Distance: >3 FB     Dental   Pulmonary former smoker,    breath sounds clear to auscultation       Cardiovascular hypertension, Pt. on medications  Rhythm:Regular Rate:Normal     Neuro/Psych negative neurological ROS     GI/Hepatic Neg liver ROS, hiatal hernia, PUD, GERD  Medicated,  Endo/Other  diabetes, Type 2  Renal/GU negative Renal ROS     Musculoskeletal  (+) Arthritis ,   Abdominal   Peds  Hematology negative hematology ROS (+)   Anesthesia Other Findings   Reproductive/Obstetrics                             Anesthesia Physical Anesthesia Plan  ASA: III  Anesthesia Plan: MAC   Post-op Pain Management:    Induction: Intravenous  PONV Risk Score and Plan: 2 and Propofol infusion, Ondansetron and Treatment may vary due to age or medical condition  Airway Management Planned: Natural Airway and Nasal Cannula  Additional Equipment:   Intra-op Plan:   Post-operative Plan:   Informed Consent: I have reviewed the patients History and Physical, chart, labs and discussed the procedure including the risks, benefits and alternatives for the proposed anesthesia with the patient or authorized representative who has indicated his/her understanding and acceptance.       Plan Discussed with: CRNA  Anesthesia Plan Comments:         Anesthesia Quick Evaluation

## 2019-09-07 NOTE — Interval H&P Note (Signed)
History and Physical Interval Note:  09/07/2019 2:00 PM  Erica Carter  has presented today for surgery, with the diagnosis of GERD, Hiatal Hernia, Schakzi's Ring.  The various methods of treatment have been discussed with the patient and family. After consideration of risks, benefits and other options for treatment, the patient has consented to  Procedure(s): ESOPHAGOGASTRODUODENOSCOPY (EGD) WITH PROPOFOL (N/A) BRAVO PH STUDY (N/A) as a surgical intervention.  The patient's history has been reviewed, patient examined, no change in status, stable for surgery.  I have reviewed the patient's chart and labs.  Questions were answered to the patient's satisfaction.     [name]

## 2019-09-07 NOTE — Anesthesia Postprocedure Evaluation (Signed)
Anesthesia Post Note  Patient: ISRAELLA HUBERT  Procedure(s) Performed: ESOPHAGOGASTRODUODENOSCOPY (EGD) WITH PROPOFOL (N/A ) BRAVO PH STUDY (N/A ) POLYPECTOMY HEMOSTASIS CLIP PLACEMENT     Patient location during evaluation: PACU Anesthesia Type: MAC Level of consciousness: awake and alert Pain management: pain level controlled Vital Signs Assessment: post-procedure vital signs reviewed and stable Respiratory status: spontaneous breathing, nonlabored ventilation, respiratory function stable and patient connected to nasal cannula oxygen Cardiovascular status: stable and blood pressure returned to baseline Postop Assessment: no apparent nausea or vomiting Anesthetic complications: no    Last Vitals:  Vitals:   09/07/19 1530 09/07/19 1540  BP: (!) 145/75 (!) 180/76  Pulse: 71 76  Resp: 20 (!) 22  Temp:    SpO2: 95% 97%    Last Pain:  Vitals:   09/07/19 1518  TempSrc: Oral  PainSc: 0-No pain                 Tiajuana Amass

## 2019-09-07 NOTE — Transfer of Care (Signed)
Immediate Anesthesia Transfer of Care Note  Patient: Erica Carter  Procedure(s) Performed: ESOPHAGOGASTRODUODENOSCOPY (EGD) WITH PROPOFOL (N/A ) BRAVO PH STUDY (N/A ) POLYPECTOMY HEMOSTASIS CLIP PLACEMENT  Patient Location: PACU  Anesthesia Type:MAC  Level of Consciousness: awake, drowsy, patient cooperative and responds to stimulation  Airway & Oxygen Therapy: Patient Spontanous Breathing and Patient connected to nasal cannula oxygen  Post-op Assessment: Report given to RN, Post -op Vital signs reviewed and stable and Patient moving all extremities  Post vital signs: Reviewed and stable  Last Vitals:  Vitals Value Taken Time  BP    Temp    Pulse 71 09/07/19 1519  Resp 19 09/07/19 1519  SpO2 98 % 09/07/19 1519  Vitals shown include unvalidated device data.  Last Pain:  Vitals:   09/07/19 1328  TempSrc: Oral  PainSc: 0-No pain         Complications: No apparent anesthesia complications

## 2019-09-07 NOTE — Discharge Instructions (Signed)
YOU HAD AN ENDOSCOPIC PROCEDURE TODAY: Refer to the procedure report and other information in the discharge instructions given to you for any specific questions about what was found during the examination. If this information does not answer your questions, please call [place] office at [phone] to clarify.   YOU SHOULD EXPECT: Some feelings of bloating in the abdomen. Passage of more gas than usual. Walking can help get rid of the air that was put into your GI tract during the procedure and reduce the bloating. If you had a lower endoscopy (such as a colonoscopy or flexible sigmoidoscopy) you may notice spotting of blood in your stool or on the toilet paper. Some abdominal soreness may be present for a day or two, also.  DIET: Your first meal following the procedure should be a light meal and then it is ok to progress to your normal diet. A half-sandwich or bowl of soup is an example of a good first meal. Heavy or fried foods are harder to digest and may make you feel nauseous or bloated. Drink plenty of fluids but you should avoid alcoholic beverages for 24 hours. If you had a esophageal dilation, please see attached instructions for diet.    ACTIVITY: Your care partner should take you home directly after the procedure. You should plan to take it easy, moving slowly for the rest of the day. You can resume normal activity the day after the procedure however YOU SHOULD NOT DRIVE, use power tools, machinery or perform tasks that involve climbing or major physical exertion for 24 hours (because of the sedation medicines used during the test).   SYMPTOMS TO REPORT IMMEDIATELY: A gastroenterologist can be reached at any hour. Please call [phone]  for any of the following symptoms:   Following upper endoscopy (EGD, EUS, ERCP, esophageal dilation) Vomiting of blood or coffee ground material  New, significant abdominal pain  New, significant chest pain or pain under the shoulder blades  Painful or  persistently difficult swallowing  New shortness of breath  Black, tarry-looking or red, bloody stools  FOLLOW UP:  If any biopsies were taken you will be contacted by phone or by letter within the next 1-3 weeks. Call [phone]  if you have not heard about the biopsies in 3 weeks.  Please also call with any specific questions about appointments or follow up tests.  

## 2019-09-10 ENCOUNTER — Encounter (HOSPITAL_COMMUNITY): Payer: Self-pay | Admitting: Gastroenterology

## 2019-09-10 ENCOUNTER — Encounter: Payer: Self-pay | Admitting: Gastroenterology

## 2019-09-10 LAB — SURGICAL PATHOLOGY

## 2019-09-21 DIAGNOSIS — E538 Deficiency of other specified B group vitamins: Secondary | ICD-10-CM | POA: Diagnosis not present

## 2019-09-27 ENCOUNTER — Ambulatory Visit: Payer: Medicare PPO | Admitting: Internal Medicine

## 2019-09-27 ENCOUNTER — Encounter: Payer: Self-pay | Admitting: Internal Medicine

## 2019-09-27 ENCOUNTER — Ambulatory Visit: Payer: Medicare PPO | Admitting: Gastroenterology

## 2019-09-27 VITALS — BP 132/82 | HR 81 | Temp 97.8°F | Ht 60.0 in | Wt 117.2 lb

## 2019-09-27 DIAGNOSIS — R1110 Vomiting, unspecified: Secondary | ICD-10-CM | POA: Diagnosis not present

## 2019-09-27 DIAGNOSIS — K5909 Other constipation: Secondary | ICD-10-CM | POA: Diagnosis not present

## 2019-09-27 DIAGNOSIS — K219 Gastro-esophageal reflux disease without esophagitis: Secondary | ICD-10-CM | POA: Diagnosis not present

## 2019-09-27 DIAGNOSIS — K5190 Ulcerative colitis, unspecified, without complications: Secondary | ICD-10-CM

## 2019-09-27 NOTE — Patient Instructions (Signed)
Continue Prilosec.  Continue your over the counter laxatives.  Continue Apriso.  Please follow up with Dr Hilarie Fredrickson in 3-4 months.  If you are age 82 or older, your body mass index should be between 23-30. Your Body mass index is 22.89 kg/m. If this is out of the aforementioned range listed, please consider follow up with your Primary Care Provider.  If you are age 39 or younger, your body mass index should be between 19-25. Your Body mass index is 22.89 kg/m. If this is out of the aformentioned range listed, please consider follow up with your Primary Care Provider.

## 2019-09-27 NOTE — Progress Notes (Signed)
Subjective:    Patient ID: Erica Carter, female    DOB: 1934/12/30, 84 y.o.   MRN: [id number]  HPI Mihira Tozzi is an 84 year old female with a past medical history of ulcerative colitis, colon polyps, GERD with hiatal hernia, chronic constipation who is here for follow-up.  She is here today with her daughter.  She has continued to have regurgitation and reflux symptoms which has been her biggest complaint now for several months.  This prompted me to refer her to Dr. Bryan Lemma for consideration of TIF.  First esophageal manometry and pH testing was also performed.  She had a Bravo capsule and Dr. Bryan Lemma repeated an upper endoscopy on 09/07/2019.  This was reviewed today.  This showed normal esophageal mucosa, hiatal hernia, gastric polyp which she resected and clipped.  This was found to be hyperplastic without dysplasia.  Bravo capsule was placed.  Bravo results showed normal esophageal acid exposure in the upright and supine position.  A high resolution manometry was normal on 08/08/2019.  She has been taking omeprazole 40 mg in the morning and most evenings before dinner.  She still having frequent regurgitation of liquid into her throat.  Occasionally she will have to spit this out or swallow it back down.  Worse at bedtime.  Does occasionally wake with cough.  Sleeps on the couch due to back pain.  No heartburn.  No abdominal pain.  Colitis under good control on a Apriso.  Using senna daily for constipation relief.  If consistent with this medication stools are regular.  Good appetite.  Review of Systems As per HPI, otherwise negative  Current Medications, Allergies, Past Medical History, Past Surgical History, Family History and Social History were reviewed in [employer] record.     Objective:   Physical Exam BP 132/82 (BP Location: Left Arm, Patient Position: Sitting)    Pulse 81    Temp 97.8 F (36.6 C)    Ht 5' (1.524 m)    Wt 117 lb 3.2 oz (53.2 kg)     SpO2 97%    BMI 22.89 kg/m  Gen: awake, alert, NAD HEENT: anicteric, op clear CV: RRR, no mrg Pulm: CTA b/l Abd: soft, NT/ND, +BS throughout Ext: no c/c/e Neuro: nonfocal       Assessment & Plan:  84 year old female with a past medical history of ulcerative colitis, colon polyps, GERD with hiatal hernia, chronic constipation who is here for follow-up.  1.  GERD/regurgitation --her acid levels are controlled however she very accurately describes nonacid regurgitation worse at night.  This is very likely related to her hiatal hernia.  I explained given the Bravo capsule findings that this is not an acid reflux problem but rather a regurgitation/nonacid reflux problem.  Treatment for this would be surgical with hiatal hernia repair with either Nissen fundoplication or TIF.  We discussed this at length today and at this point she does not wish to pursue surgery.  She will think about this and let me know.  Other recommendation is to avoid solid foods for 4 hours before lying down and liquids for 2 hours before lying down.  Sleeping on a wedge or with head elevated 30 degrees should help as well. --Can continue omeprazole 40 mg once to twice daily --Antireflux precautions and avoiding eating and drinking before lying down as above --Surgical referral for consideration of hiatal hernia repair with either Nissen fundoplication or TIF if she wishes to proceed.  We discussed this at length today  2.  Ulcerative colitis --in clinical remission on Apriso --Continue Apriso 1.5 g daily --Surveillance colonoscopy discontinued due to age, patient in full agreement with this decision  3.  Chronic constipation --relieved with an over-the-counter product containing senna --Continue senna daily as needed  6-monthfollow-up, sooner if needed or should she decide to pursue operative management

## 2019-10-06 ENCOUNTER — Emergency Department (HOSPITAL_COMMUNITY)
Admission: EM | Admit: 2019-10-06 | Discharge: 2019-10-06 | Disposition: A | Discharge status: Home / Self Care | Payer: Medicare PPO | Attending: Emergency Medicine | Admitting: Emergency Medicine

## 2019-10-06 ENCOUNTER — Other Ambulatory Visit: Payer: Self-pay

## 2019-10-06 ENCOUNTER — Encounter (HOSPITAL_COMMUNITY): Payer: Self-pay | Admitting: *Deleted

## 2019-10-06 ENCOUNTER — Emergency Department (HOSPITAL_COMMUNITY): Payer: Medicare PPO

## 2019-10-06 DIAGNOSIS — Z7982 Long term (current) use of aspirin: Secondary | ICD-10-CM | POA: Diagnosis not present

## 2019-10-06 DIAGNOSIS — M79672 Pain in left foot: Secondary | ICD-10-CM | POA: Diagnosis not present

## 2019-10-06 DIAGNOSIS — Z87891 Personal history of nicotine dependence: Secondary | ICD-10-CM | POA: Insufficient documentation

## 2019-10-06 DIAGNOSIS — E119 Type 2 diabetes mellitus without complications: Secondary | ICD-10-CM | POA: Diagnosis not present

## 2019-10-06 DIAGNOSIS — M19072 Primary osteoarthritis, left ankle and foot: Secondary | ICD-10-CM | POA: Diagnosis not present

## 2019-10-06 DIAGNOSIS — Z79899 Other long term (current) drug therapy: Secondary | ICD-10-CM | POA: Insufficient documentation

## 2019-10-06 DIAGNOSIS — M7989 Other specified soft tissue disorders: Secondary | ICD-10-CM | POA: Diagnosis not present

## 2019-10-06 MED ORDER — ACETAMINOPHEN 500 MG PO TABS
1000.0000 mg | ORAL_TABLET | Freq: Once | ORAL | Status: AC
  Administered 2019-10-06: 1000 mg via ORAL
  Filled 2019-10-06: qty 2

## 2019-10-06 NOTE — ED Provider Notes (Signed)
[facility] Provider Note   CSN: [id number] Arrival date & time: 10/06/19  4401     History   Chief Complaint Chief Complaint  Patient presents with  . Foot Pain    HPI Erica Carter is a 84 y.o. female.     84 yo F with a chief complaints of left foot pain.  This is in the medial aspect along the arch.  She denies trauma.  States that she was on her feet for about 3 hours at [facility] for black Friday.  Feels like she has some shooting pain that comes and goes about every few minutes or so.  No fevers.  No prior injury to that foot.  The history is provided by the patient.  Foot Pain This is a new problem. The current episode started yesterday. The problem occurs constantly. The problem has not changed since onset.Pertinent negatives include no chest pain, no abdominal pain, no headaches and no shortness of breath. The symptoms are aggravated by bending and twisting. Nothing relieves the symptoms. She has tried nothing for the symptoms. The treatment provided no relief.    Past Medical History:  Diagnosis Date  . Acute gastritis without mention of hemorrhage   . Diabetes mellitus without complication (HCC)    diet control, per pt  . Diverticulosis of colon (without mention of hemorrhage)   . Esophageal dysmotilities   . Esophageal reflux   . Hiatal hernia   . Other and unspecified hyperlipidemia   . Personal history of colonic polyps 09/11/2010   hyperplasic  . Skin cancer   . Stricture and stenosis of esophagus   . Ulcerative colitis, unspecified   . Unspecified essential hypertension   . Wears dentures    upper and lower  . Wears glasses     Patient Active Problem List   Diagnosis Date Noted  . Hiatal hernia   . Gastric polyp   . Status post epidural steroid injection 08/04/2017  . Unilateral primary osteoarthritis, left hip 06/17/2017  . History of gastroesophageal reflux (GERD) 10/26/2011  . GASTRITIS, ACUTE W/O  HEMORRHAGE 09/11/2010  . HYPERLIPIDEMIA 02/12/2008  . HYPERTENSION 02/12/2008  . GERD 02/12/2008  . 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 HIATAL HERNIA 05/17/2006  . COLITIS, ULCERATIVE 05/17/2006  . DIVERTICULOSIS, COLON 05/17/2006    Past Surgical History:  Procedure Laterality Date  . BRAVO [place] STUDY N/A 09/07/2019   Procedure: BRAVO [place];  Surgeon: Lavena Bullion, DO;  Location: [facility];  Service: Gastroenterology;  Laterality: N/A;  . BUNIONECTOMY     Bil  . CATARACT EXTRACTION Bilateral   . ESOPHAGEAL MANOMETRY N/A 08/08/2019   Procedure: ESOPHAGEAL MANOMETRY (EM);  Surgeon: Mauri Pole, MD;  [facility];  Service: Endoscopy;  Laterality: N/A;  . ESOPHAGOGASTRODUODENOSCOPY (EGD) WITH PROPOFOL N/A 09/07/2019   Procedure: ESOPHAGOGASTRODUODENOSCOPY (EGD) WITH PROPOFOL;  Surgeon: Lavena Bullion, DO;  [facility];  Service: Gastroenterology;  Laterality: N/A;  . HEMOSTASIS CLIP PLACEMENT  09/07/2019   Procedure: HEMOSTASIS CLIP PLACEMENT;  Surgeon: Lavena Bullion, DO;  [facility];  Service: Gastroenterology;;  . NASAL SINUS SURGERY    . POLYPECTOMY  09/07/2019   Procedure: POLYPECTOMY;  Surgeon: Lavena Bullion, DO;  [facility];  Service: Gastroenterology;;  . THUMB ARTHROSCOPY Left   . TUBAL LIGATION       OB History   No obstetric history on file.      Home Medications    Prior to  Admission medications   Medication Sig Start Date End Date Taking? Authorizing Provider  amLODipine (NORVASC) 5 MG tablet Take 5 mg by mouth daily.    [provider]  aspirin 81 MG tablet Take 81 mg by mouth daily.    [provider]  atorvastatin (LIPITOR) 20 MG tablet Take 20 mg by mouth daily.     [provider]  calcium gluconate 500 MG tablet Take 500 mg by mouth daily.      [provider]  cholecalciferol (VITAMIN D) 1000 UNITS tablet Take 1,000 Units by mouth daily.      [provider]  Cyanocobalamin (VITAMIN B-12 IJ) Inject 1,000 mcg as directed every 30 (thirty) days. Pt gets shot once a month     [provider]  losartan (COZAAR) 100 MG tablet Take 100 mg by mouth daily.      [provider]  mesalamine (APRISO) 0.375 g 24 hr capsule Take 4 capsules (1.5 g total) by mouth daily. 07/24/19   Pyrtle, Lajuan Lines, MD  Misc Natural Products (COLON CARE PO) Take 1 tablet by mouth daily as needed (constipation).     [provider]  Multiple Vitamins-Minerals (CENTRUM SILVER PO) Take 1 tablet by mouth daily.      [provider]  omeprazole (PRILOSEC) 40 MG capsule Take 40 mg by mouth 2 (two) times daily.    [provider]  vitamin B-12 (CYANOCOBALAMIN) 500 MCG tablet Take 500 mcg by mouth daily.    [provider]    Family History Family History  Problem Relation Age of Onset  . Thyroid cancer Brother        mets to brain  . Heart disease Brother   . Diabetes Mother   . Diabetes Sister   . Heart disease Father   . Heart disease Sister   . Heart disease Brother   . Heart disease Brother   . Heart disease Brother   . Colon cancer Neg Hx   . Inflammatory bowel disease Neg Hx   . Liver disease Neg Hx   . Stomach cancer Neg Hx   . Pancreatic cancer Neg Hx     Social History Social History   Tobacco Use  . Smoking status: Former Research scientist (life sciences)  . Smokeless tobacco: Never Used  . Tobacco comment: stopped over 20 years  Substance Use Topics  . Alcohol use: No  . Drug use: No     Allergies   Codeine   Review of Systems Review of Systems  Constitutional: Negative for chills and fever.  HENT: Negative for congestion and rhinorrhea.   Eyes: Negative for redness and visual disturbance.  Respiratory: Negative for shortness of breath and wheezing.   Cardiovascular: Negative for chest pain and palpitations.  Gastrointestinal: Negative for abdominal pain, nausea and vomiting.  Genitourinary: Negative for  dysuria and urgency.  Musculoskeletal: Positive for arthralgias. Negative for myalgias.  Skin: Negative for pallor and wound.  Neurological: Negative for dizziness and headaches.     Physical Exam Updated Vital Signs BP 122/61 (BP Location: Left Arm)   Pulse 66   Temp 98.2 F (36.8 C) (Oral)   Resp 16   Ht 5' (1.524 m)   Wt 53.1 kg   SpO2 98%   BMI 22.86 kg/m   Physical Exam Vitals signs and nursing note reviewed.  Constitutional:      General: She is not in acute distress.    Appearance: She is well-developed. She is not diaphoretic.  HENT:     Head: Normocephalic and atraumatic.  Eyes:     Pupils: Pupils are equal, round, and reactive to light.  Neck:     Musculoskeletal: Normal range of motion and neck supple.  Cardiovascular:     Rate and Rhythm: Normal rate and regular rhythm.     Heart sounds: No murmur. No friction rub. No gallop.   Pulmonary:     Effort: Pulmonary effort is normal.     Breath sounds: No wheezing or rales.  Abdominal:     General: There is no distension.     Palpations: Abdomen is soft.     Tenderness: There is no abdominal tenderness.  Musculoskeletal:        General: Tenderness present.     Comments: Area of erythema to the medial aspect of the foot. No appreciable bony tenderness  Skin:    General: Skin is warm and dry.  Neurological:     Mental Status: She is alert and oriented to person, place, and time.  Psychiatric:        Behavior: Behavior normal.      ED Treatments / Results  Labs (all labs ordered are listed, but only abnormal results are displayed) Labs Reviewed - No data to display  EKG None  Radiology Dg Ankle Complete Left  Result Date: 10/06/2019 CLINICAL DATA:  LEFT foot pain, swelling. EXAM: LEFT ANKLE COMPLETE - 3+ VIEW COMPARISON:  None. FINDINGS: Osseous alignment is normal. Ankle mortise is symmetric. No fracture line or acute fracture displacement. Chronic appearing calcification underlying the medial  malleolus. Lucency at the articular surface of the medial talar dome, likely degenerative subchondral cyst raising the possibility of osteochondral defect. No osteophyte formation or other secondary signs of advanced DJD at the LEFT ankle. Visualized portions of the hindfoot and midfoot appear intact and normally aligned. Mild degenerative spurring within the midfoot. Soft tissues about the LEFT ankle and hindfoot are unremarkable. IMPRESSION: 1. Lucency at the articular surface of the medial talar dome, likely degenerative subchondral cyst, raising the possibility of associated chronic osteochondral defect. 2. No acute findings. 3. Mild degenerative spurring within the midfoot. Electronically Signed   By: Franki Cabot M.D.   On: 10/06/2019 10:04   Dg Foot Complete Left  Result Date: 10/06/2019 CLINICAL DATA:  LEFT foot pain, swelling to medial aspect of LEFT foot. EXAM: LEFT FOOT - COMPLETE 3+ VIEW COMPARISON:  None. FINDINGS: Advanced degenerative osteoarthritis at the first MTP joint, with associated joint space narrowing, articular surface sclerosis, degenerative subchondral cyst formation and osseous spurring. Mild degenerative spurring within the midfoot. Remainder of the joint spaces appear well maintained. No acute or suspicious osseous finding. Soft tissues about the LEFT foot are unremarkable. IMPRESSION: 1. Advanced degenerative osteoarthritis at the first MTP joint. 2. Mild degenerative spurring within the midfoot. 3. No acute findings. Electronically Signed   By: Franki Cabot M.D.   On: 10/06/2019 10:06    Procedures Procedures (including critical care time)  Medications Ordered in ED Medications  acetaminophen (TYLENOL) tablet 1,000 mg (1,000 mg Oral Given 10/06/19 1119)     Initial Impression / Assessment and Plan / ED Course  I have reviewed the triage vital signs and the nursing notes.  Pertinent labs & imaging results that were available during my care of the patient were  reviewed by me and considered in my medical decision making (see chart for details).        84 yo F with a chief complaint of  left foot pain.  Clinically it appears that the patient has most likely a friction injury to the foot.  Plain films reviewed by me without fracture.  Radiology is reading a likely chronic osteochondral defect.  We will have her follow-up with PCP.  Tylenol for pain.  Have her apply some sort of barrier to that area of the foot.  11:35 AM:  I have discussed the diagnosis/risks/treatment options with the patient and family and believe the pt to be eligible for discharge home to follow-up with PCP. We also discussed returning to the ED immediately if new or worsening sx occur. We discussed the sx which are most concerning (e.g., sudden worsening pain, fever, inability to tolerate by mouth) that necessitate immediate return. Medications administered to the patient during their visit and any new prescriptions provided to the patient are listed below.  Medications given during this visit Medications  acetaminophen (TYLENOL) tablet 1,000 mg (1,000 mg Oral Given 10/06/19 1119)     The patient appears reasonably screen and/or stabilized for discharge and I doubt any other medical condition or other [facility] requiring further screening, evaluation, or treatment in the ED at this time prior to discharge.    Final Clinical Impressions(s) / ED Diagnoses   Final diagnoses:  Foot pain, left    ED Discharge Orders    None       Deno Etienne, DO 10/06/19 1135

## 2019-10-06 NOTE — ED Notes (Signed)
Discharge instructions reviewed with patient and daughter. Follow-up care was discussed and questions were answered. Ankle brace applied. No signature pad available.

## 2019-10-06 NOTE — ED Notes (Signed)
Patient transported to X-ray 

## 2019-10-06 NOTE — Discharge Instructions (Signed)
There were no broken bone seen on your x-ray.  The radiologist saw a likely chronic finding that he thought was a cyst along one of the bones of your foot.  Please discuss this with your family doctor.   I think most likely you have a friction injury to the inside of your foot.  You can apply moleskin, gel-like substance that you can buy at the drugstore and you can put it over the area to help reduce the friction.  Take Tylenol as needed for pain.  Please follow-up with your family doctor.

## 2019-10-06 NOTE — ED Triage Notes (Signed)
Pt states L foot pain after shopping for 3 hours yesterday.  Pain and swelling to medial aspect of L foot. Pt able to ambulate.

## 2019-10-09 ENCOUNTER — Other Ambulatory Visit: Payer: Self-pay | Admitting: Internal Medicine

## 2019-10-09 MED ORDER — OMEPRAZOLE 40 MG PO CPDR: DELAYED_RELEASE_CAPSULE | ORAL | 3 refills | Status: DC

## 2019-10-09 NOTE — Telephone Encounter (Signed)
Pt recently seen in the office.  Sent refill to [facility] per pt request.

## 2019-10-09 NOTE — Telephone Encounter (Signed)
Pt requested a 30-day supply of omeprazole sent to [facility].

## 2019-10-19 DIAGNOSIS — E538 Deficiency of other specified B group vitamins: Secondary | ICD-10-CM | POA: Diagnosis not present

## 2019-10-23 ENCOUNTER — Telehealth: Payer: Self-pay | Admitting: Internal Medicine

## 2019-10-23 MED ORDER — MESALAMINE ER 0.375 G PO CP24: 1.5000 g | ORAL_CAPSULE | Freq: Every day | ORAL | 1 refills | Status: DC

## 2019-10-23 NOTE — Telephone Encounter (Signed)
Pt requested a refill for mesalamine.

## 2019-10-23 NOTE — Telephone Encounter (Signed)
Rx sent 

## 2019-11-28 ENCOUNTER — Other Ambulatory Visit (HOSPITAL_COMMUNITY): Payer: Self-pay | Admitting: Physician Assistant

## 2019-11-28 ENCOUNTER — Ambulatory Visit
Admission: EM | Admit: 2019-11-28 | Discharge: 2019-11-28 | Disposition: A | Discharge status: Home / Self Care | Payer: Medicare Other | Attending: Emergency Medicine | Admitting: Emergency Medicine

## 2019-11-28 DIAGNOSIS — U071 COVID-19: Secondary | ICD-10-CM | POA: Diagnosis not present

## 2019-11-28 DIAGNOSIS — Z20822 Contact with and (suspected) exposure to covid-19: Secondary | ICD-10-CM

## 2019-11-28 DIAGNOSIS — I10 Essential (primary) hypertension: Secondary | ICD-10-CM

## 2019-11-28 LAB — POC SARS CORONAVIRUS 2 AG -  ED: SARS Coronavirus 2 Ag: POSITIVE — AB

## 2019-11-28 NOTE — Progress Notes (Signed)
  I connected by phone with Sibyl Parr on 11/28/2019 at 4:24 PM to discuss the potential use of an new treatment for mild to moderate COVID-19 viral infection in non-hospitalized patients.  This patient is a 85 y.o. female that meets the FDA criteria for Emergency Use Authorization of bamlanivimab or casirivimab\imdevimab.  Has a (+) direct SARS-CoV-2 viral test result  Has mild or moderate COVID-19   Is ? 85 years of age and weighs ? 40 kg  Is NOT hospitalized due to COVID-19  Is NOT requiring oxygen therapy or requiring an increase in baseline oxygen flow rate due to COVID-19  Is within 10 days of symptom onset  Has at least one of the high risk factor(s) for progression to severe COVID-19 and/or hospitalization as defined in EUA.  Specific high risk criteria : >/= 83 yo    HX of HTN   I have spoken and communicated the following to the patient or parent/caregiver:  1. FDA has authorized the emergency use of bamlanivimab and casirivimab\imdevimab for the treatment of mild to moderate COVID-19 in adults and pediatric patients with positive results of direct SARS-CoV-2 viral testing who are 31 years of age and older weighing at least 40 kg, and who are at high risk for progressing to severe COVID-19 and/or hospitalization.  2. The significant known and potential risks and benefits of bamlanivimab and casirivimab\imdevimab, and the extent to which such potential risks and benefits are unknown.  3. Information on available alternative treatments and the risks and benefits of those alternatives, including clinical trials.  4. Patients treated with bamlanivimab and casirivimab\imdevimab should continue to self-isolate and use infection control measures (e.g., wear mask, isolate, social distance, avoid sharing personal items, clean and disinfect "high touch" surfaces, and frequent handwashing) according to CDC guidelines.   5. The patient or parent/caregiver has the option to accept  or refuse bamlanivimab or casirivimab\imdevimab .  After reviewing this information with the patient, The patient agreed to proceed with receiving the bamlanimivab infusion and will be provided a copy of the Fact sheet prior to receiving the infusion.  Crista Luria Kimoni Pickerill 11/28/2019 4:24 PM

## 2019-11-28 NOTE — ED Triage Notes (Signed)
Pt c/o cough and not feeling good since yesterday. States her daughter and grandson of the home are COVID positive

## 2019-11-28 NOTE — ED Provider Notes (Signed)
[name]    CSN: [id number] Arrival date & time: 11/28/19  1252      History   Chief Complaint Chief Complaint  Patient presents with  . Cough    HPI Erica Carter is a 85 y.o. female with history of diabetes, skin cancer  Presenting for Covid testing: Exposure: Daughter and grandson who lives at home with patient Date of exposure: Cohabitate/chronic Any fever, symptoms since exposure: Yes: Malaise since yesterday. Denying fever, chest pain, cough, shortness of breath.  No change in appetite, abdominal pain, nausea, vomiting, diarrhea.   Past Medical History:  Diagnosis Date  . Acute gastritis without mention of hemorrhage   . Diabetes mellitus without complication (HCC)    diet control, per pt  . Diverticulosis of colon (without mention of hemorrhage)   . Esophageal dysmotilities   . Esophageal reflux   . Hiatal hernia   . Other and unspecified hyperlipidemia   . Personal history of colonic polyps 09/11/2010   hyperplasic  . Skin cancer   . Stricture and stenosis of esophagus   . Ulcerative colitis, unspecified   . Unspecified essential hypertension   . Wears dentures    upper and lower  . Wears glasses     Patient Active Problem List   Diagnosis Date Noted  . Hiatal hernia   . Gastric polyp   . Status post epidural steroid injection 08/04/2017  . Unilateral primary osteoarthritis, left hip 06/17/2017  . History of gastroesophageal reflux (GERD) 10/26/2011  . GASTRITIS, ACUTE W/O HEMORRHAGE 09/11/2010  . HYPERLIPIDEMIA 02/12/2008  . Essential hypertension 02/12/2008  . GERD 02/12/2008  . 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 HIATAL HERNIA 05/17/2006  . COLITIS, ULCERATIVE 05/17/2006  . DIVERTICULOSIS, COLON 05/17/2006    Past Surgical History:  Procedure Laterality Date  . BRAVO [place] STUDY N/A 09/07/2019   Procedure: BRAVO [place];  Surgeon: Lavena Bullion, DO;  [facility];  Service: Gastroenterology;  Laterality: N/A;  .  BUNIONECTOMY     Bil  . CATARACT EXTRACTION Bilateral   . ESOPHAGEAL MANOMETRY N/A 08/08/2019   Procedure: ESOPHAGEAL MANOMETRY (EM);  Surgeon: Mauri Pole, MD;  [facility];  Service: Endoscopy;  Laterality: N/A;  . ESOPHAGOGASTRODUODENOSCOPY (EGD) WITH PROPOFOL N/A 09/07/2019   Procedure: ESOPHAGOGASTRODUODENOSCOPY (EGD) WITH PROPOFOL;  Surgeon: Lavena Bullion, DO;  [facility];  Service: Gastroenterology;  Laterality: N/A;  . HEMOSTASIS CLIP PLACEMENT  09/07/2019   Procedure: HEMOSTASIS CLIP PLACEMENT;  Surgeon: Lavena Bullion, DO;  [facility];  Service: Gastroenterology;;  . NASAL SINUS SURGERY    . POLYPECTOMY  09/07/2019   Procedure: POLYPECTOMY;  Surgeon: Lavena Bullion, DO;  [facility];  Service: Gastroenterology;;  . THUMB ARTHROSCOPY Left   . TUBAL LIGATION      OB History   No obstetric history on file.      Home Medications    Prior to Admission medications   Medication Sig Start Date End Date Taking? Authorizing Provider  amLODipine (NORVASC) 5 MG tablet Take 5 mg by mouth daily.    [provider]  aspirin 81 MG tablet Take 81 mg by mouth daily.    [provider]  atorvastatin (LIPITOR) 20 MG tablet Take 20 mg by mouth daily.     [provider]  calcium gluconate 500 MG tablet Take 500 mg by mouth daily.      [provider]  cholecalciferol (VITAMIN D) 1000 UNITS tablet Take 1,000 Units by  mouth daily.      [provider]  Cyanocobalamin (VITAMIN B-12 IJ) Inject 1,000 mcg as directed every 30 (thirty) days. Pt gets shot once a month     [provider]  losartan (COZAAR) 100 MG tablet Take 100 mg by mouth daily.      [provider]  mesalamine (APRISO) 0.375 g 24 hr capsule Take 4 capsules (1.5 g total) by mouth daily. 10/23/19   Pyrtle, Lajuan Lines, MD  Misc Natural Products (COLON CARE PO) Take 1 tablet by mouth daily as needed  (constipation).     [provider]  Multiple Vitamins-Minerals (CENTRUM SILVER PO) Take 1 tablet by mouth daily.      [provider]  omeprazole (PRILOSEC) 40 MG capsule Take one tablet by mouth once or twice daily as needed 10/09/19   Pyrtle, Lajuan Lines, MD  vitamin B-12 (CYANOCOBALAMIN) 500 MCG tablet Take 500 mcg by mouth daily.    [provider]    Family History Family History  Problem Relation Age of Onset  . Thyroid cancer Brother        mets to brain  . Heart disease Brother   . Diabetes Mother   . Diabetes Sister   . Heart disease Father   . Heart disease Sister   . Heart disease Brother   . Heart disease Brother   . Heart disease Brother   . Colon cancer Neg Hx   . Inflammatory bowel disease Neg Hx   . Liver disease Neg Hx   . Stomach cancer Neg Hx   . Pancreatic cancer Neg Hx     Social History Social History   Tobacco Use  . Smoking status: Former Research scientist (life sciences)  . Smokeless tobacco: Never Used  . Tobacco comment: stopped over 20 years  Substance Use Topics  . Alcohol use: No  . Drug use: No     Allergies   Codeine   Review of Systems As per HPI   Physical Exam Triage Vital Signs ED Triage Vitals  Enc Vitals Group     BP      Pulse      Resp      Temp      Temp src      SpO2      Weight      Height      Head Circumference      Peak Flow      Pain Score      Pain Loc      Pain Edu?      Excl. in [place]?    No data found.  Updated Vital Signs BP 131/73 (BP Location: Left Arm)   Pulse 78   Temp 98.6 F (37 C) (Oral)   Resp 18   SpO2 96%   Visual Acuity Right Eye Distance:   Left Eye Distance:   Bilateral Distance:    Right Eye Near:   Left Eye Near:    Bilateral Near:     Physical Exam Constitutional:      General: She is not in acute distress.    Appearance: She is obese. She is not ill-appearing or diaphoretic.  HENT:     Head: Normocephalic and atraumatic.     Mouth/Throat:     Mouth: Mucous membranes  are moist.     Pharynx: Oropharynx is clear. No oropharyngeal exudate or posterior oropharyngeal erythema.  Eyes:     General: No scleral icterus.    Conjunctiva/sclera: Conjunctivae normal.  Pupils: Pupils are equal, round, and reactive to light.  Neck:     Comments: Trachea midline, negative JVD Cardiovascular:     Rate and Rhythm: Normal rate and regular rhythm.     Heart sounds: No murmur. No gallop.   Pulmonary:     Effort: Pulmonary effort is normal. No respiratory distress.     Breath sounds: No wheezing, rhonchi or rales.  Abdominal:     General: Abdomen is flat.     Tenderness: There is no abdominal tenderness.  Musculoskeletal:     Cervical back: Neck supple. No tenderness.     Right lower leg: No edema.     Left lower leg: No edema.  Lymphadenopathy:     Cervical: No cervical adenopathy.  Skin:    General: Skin is warm.     Capillary Refill: Capillary refill takes less than 2 seconds.     Coloration: Skin is not jaundiced or pale.     Findings: No rash.  Neurological:     General: No focal deficit present.     Mental Status: She is alert and oriented to person, place, and time.      UC Treatments / Results  Labs (all labs ordered are listed, but only abnormal results are displayed) Labs Reviewed  POC SARS CORONAVIRUS 2 AG -  ED - Abnormal; Notable for the following components:      Result Value   SARS Coronavirus 2 Ag Positive (*)    All other components within normal limits    EKG   Radiology No results found.  Procedures Procedures (including critical care time)  Medications Ordered in UC Medications - No data to display  Initial Impression / Assessment and Plan / UC Course  I have reviewed the triage vital signs and the nursing notes.  Pertinent labs & imaging results that were available during my care of the patient were reviewed by me and considered in my medical decision making (see chart for details).     Patient afebrile, nontoxic,  with SpO2 96%.  Given patient's age and comorbidities, rapid Covid was obtained in office: Positive.  Reviewed findings with patient who verbalized understanding.  Patient denies any cardiopulmonary symptoms at this time and exam is reassuring: reviewed supportive management.  This provider contacted infusion clinic upon patient discharge with patient's permission; relayed patient contact info for possible Mab therapy.  Return precautions discussed, patient verbalized understanding and is agreeable to plan. Final Clinical Impressions(s) / UC Diagnoses   Final diagnoses:  [id number] virus infection  Exposure to COVID-19 virus     Discharge Instructions     Should receive a call later today/tomorrow from the infusion clinic regarding potential treatment for Covid-19.   It is very important to remember that since you have tested positive for Covid you need to be staying home and quarantining to help keep others safe and healthy in the community.  If you would like further evaluation for persistent or worsening symptoms, you may schedule an E-visit or virtual (video) visit throughout the [facility] app or website.  PLEASE NOTE: If you develop severe chest pain or shortness of breath please go to the ER or call [phone] for further evaluation --> DO NOT schedule electronic or virtual visits for this. Please call our office for further guidance / recommendations as needed.  For information about the Covid vaccine, please visit [email or web address]    ED Prescriptions    None     PDMP not reviewed this encounter.  [place], [place], [place] 11/28/19 1717

## 2019-11-28 NOTE — Discharge Instructions (Signed)
Should receive a call later today/tomorrow from the infusion clinic regarding potential treatment for Covid-19.   It is very important to remember that since you have tested positive for Covid you need to be staying home and quarantining to help keep others safe and healthy in the community.  If you would like further evaluation for persistent or worsening symptoms, you may schedule an E-visit or virtual (video) visit throughout the [facility] app or website.  PLEASE NOTE: If you develop severe chest pain or shortness of breath please go to the ER or call [phone] for further evaluation --> DO NOT schedule electronic or virtual visits for this. Please call our office for further guidance / recommendations as needed.  For information about the Covid vaccine, please visit [email or web address]

## 2019-11-29 ENCOUNTER — Ambulatory Visit (HOSPITAL_COMMUNITY)
Admission: RE | Admit: 2019-11-29 | Discharge: 2019-11-29 | Disposition: A | Discharge status: Home / Self Care | Payer: Medicare Other | Source: Ambulatory Visit | Attending: Pulmonary Disease | Admitting: Pulmonary Disease

## 2019-11-29 DIAGNOSIS — U071 COVID-19: Secondary | ICD-10-CM

## 2019-11-29 DIAGNOSIS — Z23 Encounter for immunization: Secondary | ICD-10-CM | POA: Insufficient documentation

## 2019-11-29 MED ORDER — DIPHENHYDRAMINE HCL 50 MG/ML IJ SOLN: 50.0000 mg | Freq: Once | INTRAMUSCULAR | Status: DC | PRN

## 2019-11-29 MED ORDER — EPINEPHRINE 0.3 MG/0.3ML IJ SOAJ: 0.3000 mg | Freq: Once | INTRAMUSCULAR | Status: DC | PRN

## 2019-11-29 MED ORDER — METHYLPREDNISOLONE SODIUM SUCC 125 MG IJ SOLR: 125.0000 mg | Freq: Once | INTRAMUSCULAR | Status: DC | PRN

## 2019-11-29 MED ORDER — ALBUTEROL SULFATE HFA 108 (90 BASE) MCG/ACT IN AERS: 2.0000 | INHALATION_SPRAY | Freq: Once | RESPIRATORY_TRACT | Status: DC | PRN

## 2019-11-29 MED ORDER — FAMOTIDINE IN NACL 20-0.9 MG/50ML-% IV SOLN: 20.0000 mg | Freq: Once | INTRAVENOUS | Status: DC | PRN

## 2019-11-29 MED ORDER — SODIUM CHLORIDE 0.9 % IV SOLN
700.0000 mg | Freq: Once | INTRAVENOUS | Status: AC
  Administered 2019-11-29: 700 mg via INTRAVENOUS
  Filled 2019-11-29: qty 20

## 2019-11-29 MED ORDER — SODIUM CHLORIDE 0.9 % IV SOLN
INTRAVENOUS | Status: DC | PRN
  Administered 2019-11-29: 250 mL via INTRAVENOUS

## 2019-11-29 NOTE — Discharge Instructions (Signed)

## 2019-11-29 NOTE — Progress Notes (Signed)
Patient ID: KYLAN VEACH, female   DOB: 10/18/1935, 85 y.o.   MRN: [id number]   Diagnosis: COVID-19  Physician: Dr. Asencion Noble  Procedure: [facility] Infusion Clinic Med: bamlanivimab infusion - Provided patient with bamlanimivab fact sheet for patients, parents and caregivers prior to infusion.  Complications: No immediate complications noted.  Discharge: Discharged home   Gaye Alken 11/29/2019

## 2019-12-01 ENCOUNTER — Ambulatory Visit (HOSPITAL_COMMUNITY): Payer: Medicare Other

## 2019-12-20 ENCOUNTER — Ambulatory Visit: Payer: Medicare PPO

## 2020-01-28 ENCOUNTER — Encounter: Payer: Self-pay | Admitting: Internal Medicine

## 2020-04-16 ENCOUNTER — Other Ambulatory Visit: Payer: Self-pay | Admitting: Internal Medicine

## 2020-04-25 IMAGING — CR DG ANKLE COMPLETE 3+V*L*
3 series · 3 of 3 positions shown · non-contrast
Comparison: None.

CLINICAL DATA: LEFT foot pain, swelling.

EXAM:
LEFT ANKLE COMPLETE - 3+ VIEW

[ankle ap]
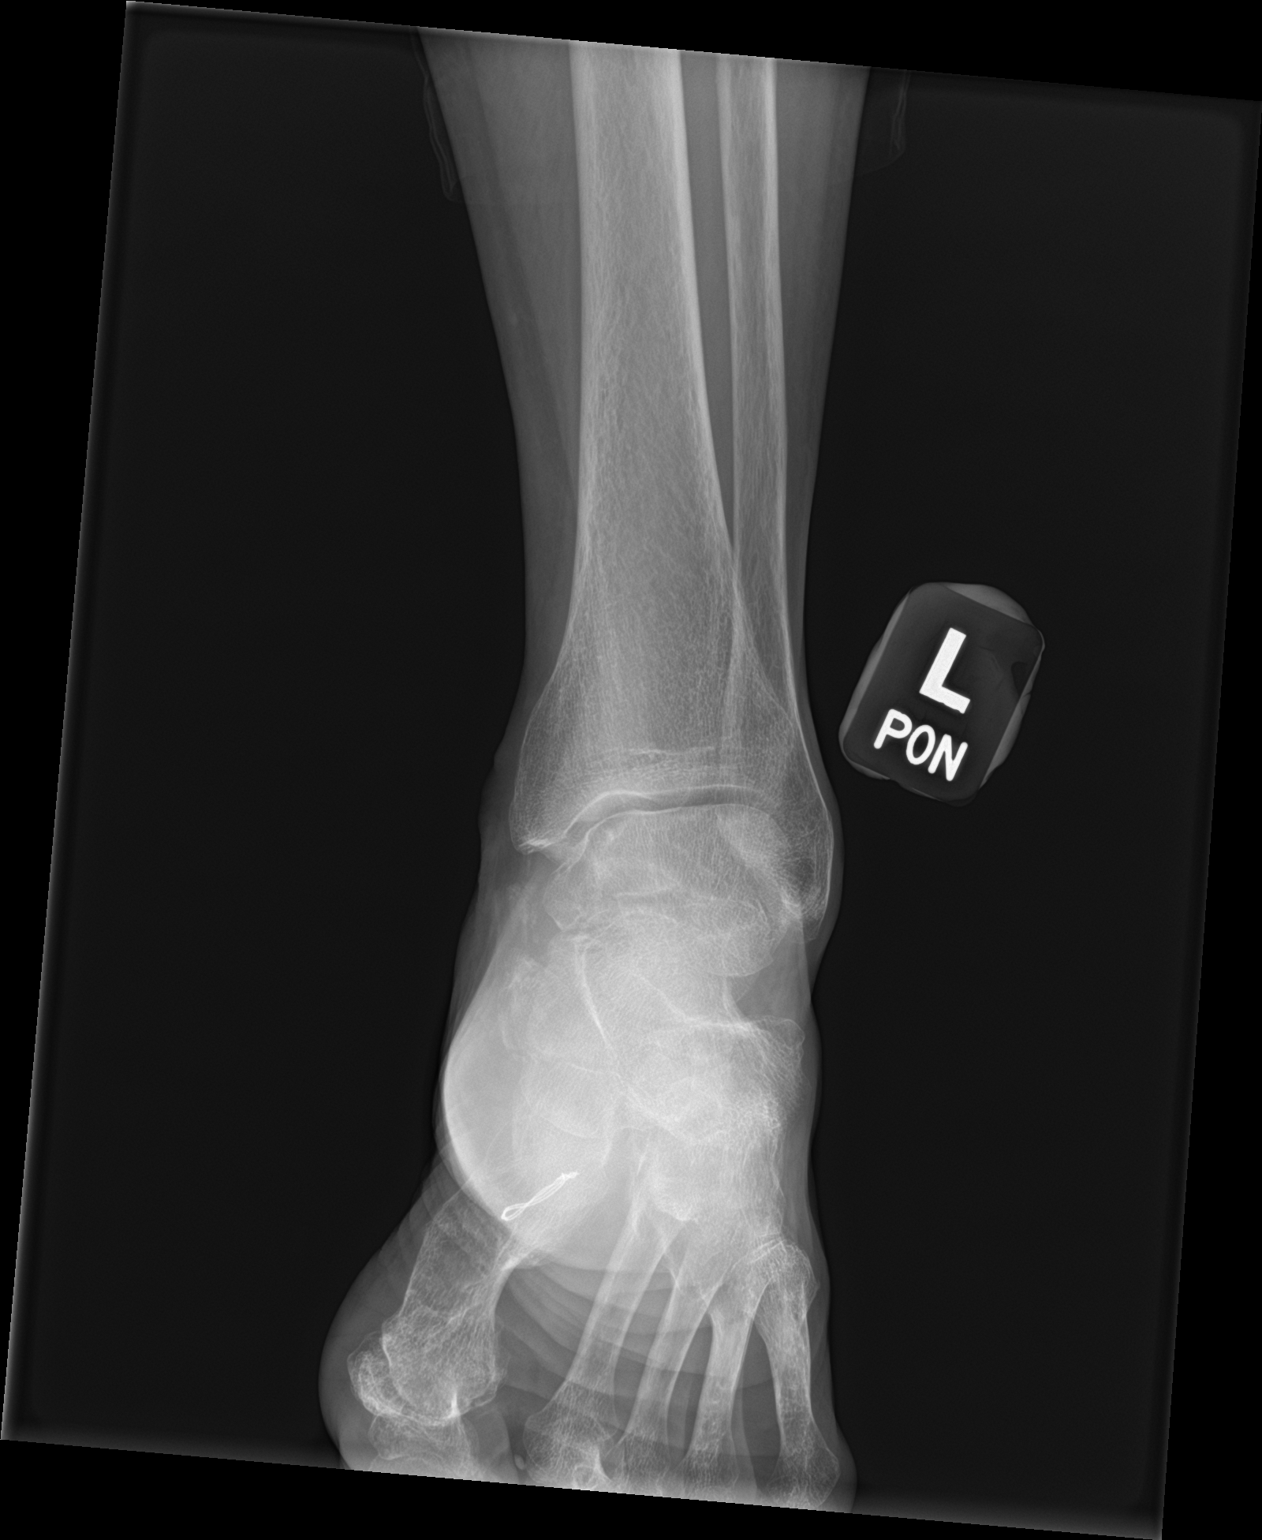

[ankle obl]
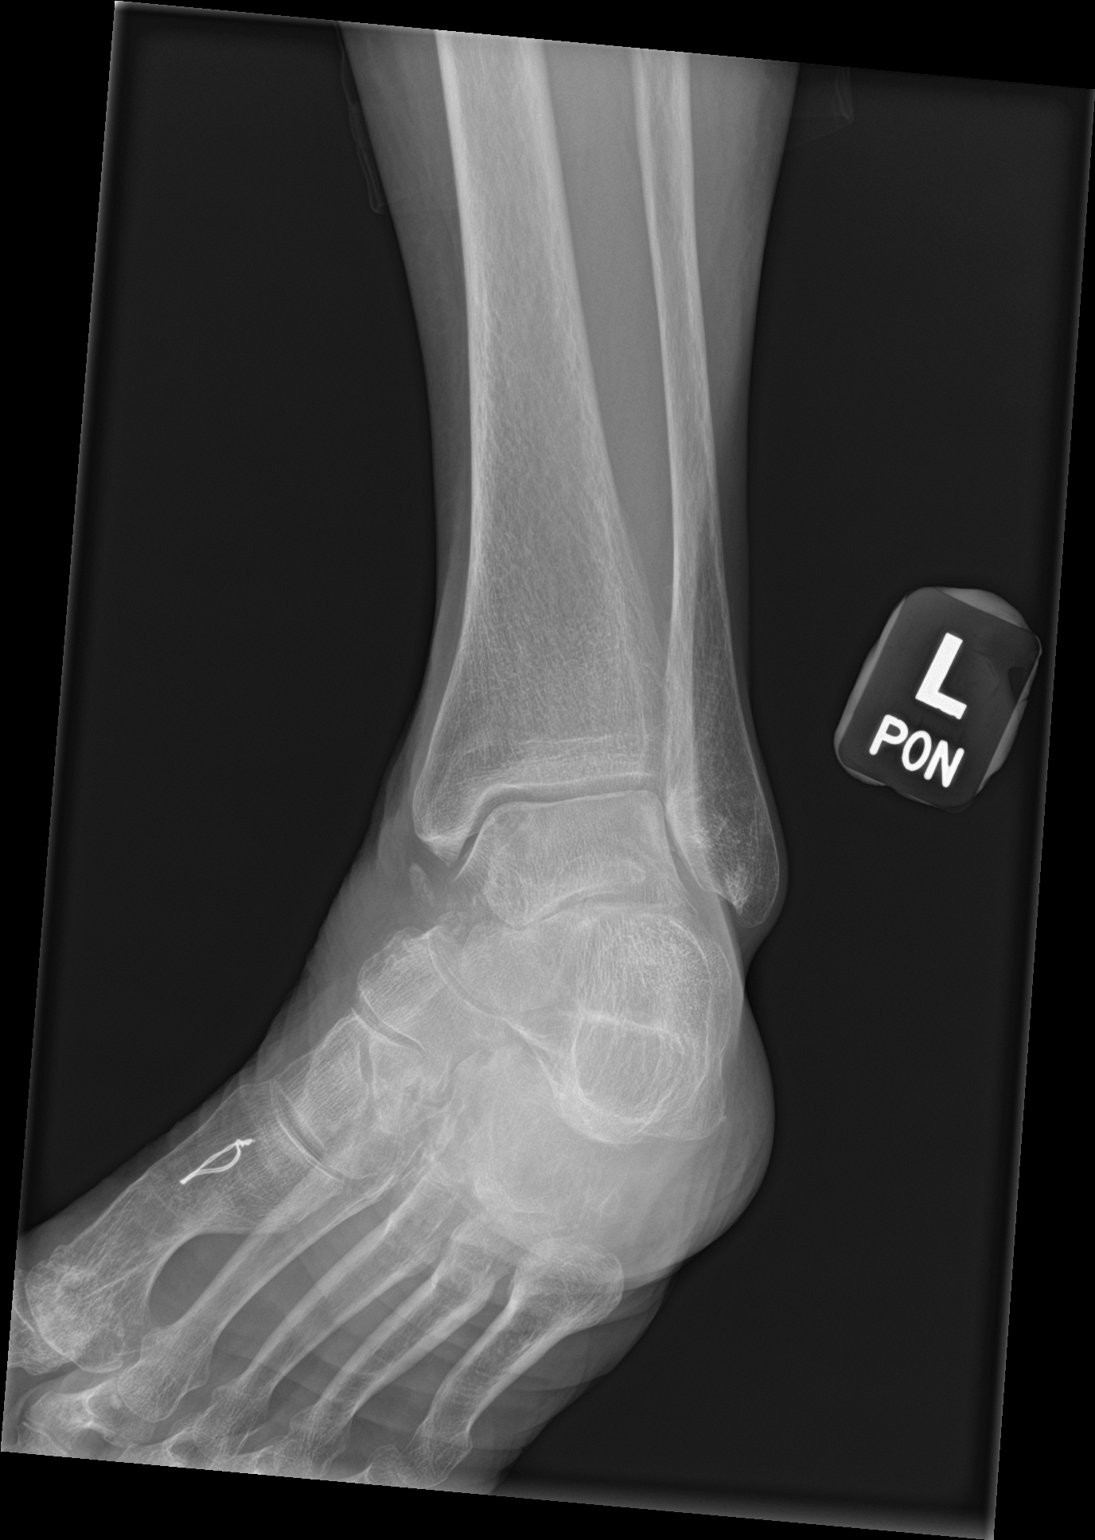

[ankle lat]
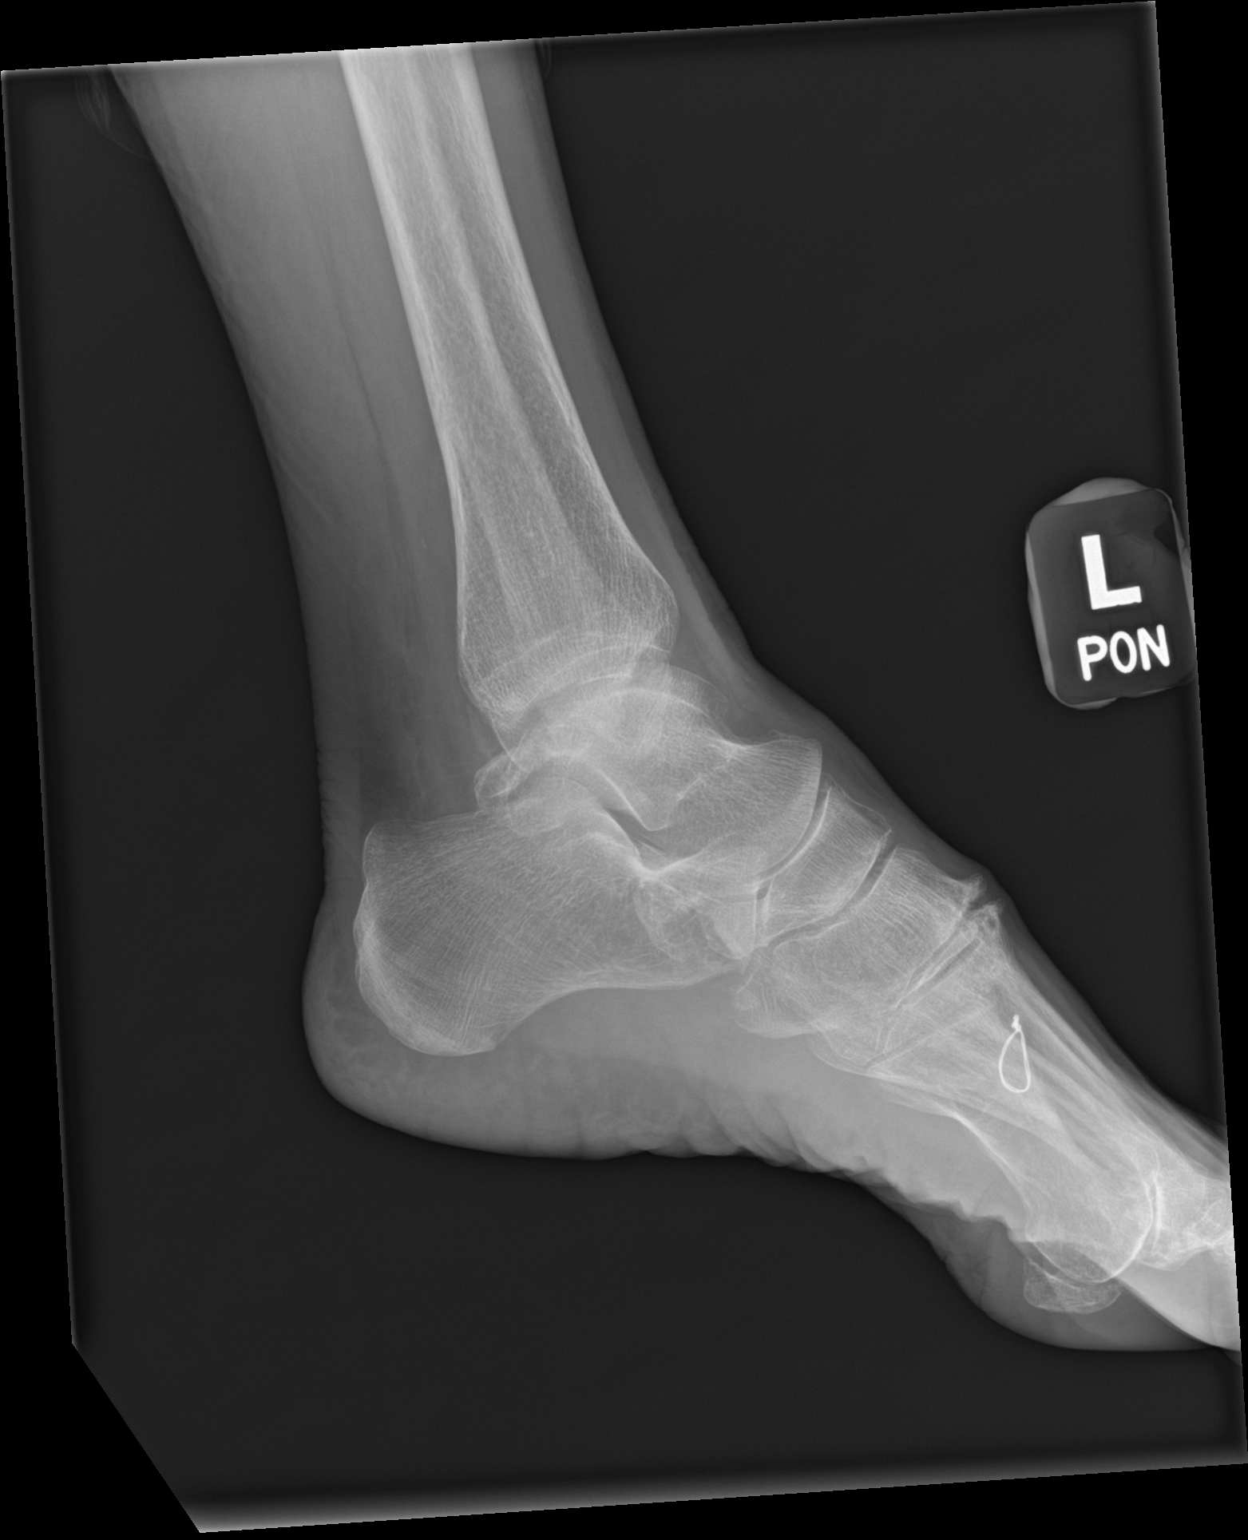

[3 of 3 positions shown; findings below may reference images not displayed]

FINDINGS: Osseous alignment is normal. Ankle mortise is symmetric. No fracture
line or acute fracture displacement. Chronic appearing calcification
underlying the medial malleolus.

Lucency at the articular surface of the medial talar dome, likely
degenerative subchondral cyst raising the possibility of
osteochondral defect. No osteophyte formation or other secondary
signs of advanced DJD at the LEFT ankle.

Visualized portions of the hindfoot and midfoot appear intact and
normally aligned. Mild degenerative spurring within the midfoot.
Soft tissues about the LEFT ankle and hindfoot are unremarkable.
IMPRESSION: 1. Lucency at the articular surface of the medial talar dome, likely
degenerative subchondral cyst, raising the possibility of associated
chronic osteochondral defect.
2. No acute findings.
3. Mild degenerative spurring within the midfoot.

## 2020-05-13 ENCOUNTER — Telehealth: Payer: Self-pay | Admitting: Internal Medicine

## 2020-05-13 MED ORDER — OMEPRAZOLE 40 MG PO CPDR: DELAYED_RELEASE_CAPSULE | ORAL | 0 refills | Status: DC

## 2020-05-13 NOTE — Telephone Encounter (Signed)
Patient advised that she is to continue omeprazole. She is also advised that Dr Hilarie Fredrickson had requested she follow up in the office 6 months following her last appointment with him in 09/2020. She is scheduled for follow up 06/06/20. She verbalizes understanding of upcoming appointment time and date. Medication has been sent to her pharmacy until upcoming appointment.

## 2020-06-06 ENCOUNTER — Ambulatory Visit: Payer: Medicare Other | Admitting: Internal Medicine

## 2020-06-06 ENCOUNTER — Encounter: Payer: Self-pay | Admitting: Internal Medicine

## 2020-06-06 VITALS — BP 102/60 | HR 80 | Ht 60.0 in | Wt 122.0 lb

## 2020-06-06 DIAGNOSIS — K219 Gastro-esophageal reflux disease without esophagitis: Secondary | ICD-10-CM | POA: Diagnosis not present

## 2020-06-06 DIAGNOSIS — K5190 Ulcerative colitis, unspecified, without complications: Secondary | ICD-10-CM | POA: Diagnosis not present

## 2020-06-06 DIAGNOSIS — K5909 Other constipation: Secondary | ICD-10-CM

## 2020-06-06 DIAGNOSIS — R1110 Vomiting, unspecified: Secondary | ICD-10-CM | POA: Diagnosis not present

## 2020-06-06 NOTE — Progress Notes (Signed)
   Subjective:    Patient ID: ZEINAB RODWELL, female    DOB: 31-Mar-1935, 85 y.o.   MRN: [id number]  HPI Ahtziry Saathoff is an 85 year old female with a history of ulcerative colitis, colon polyps, GERD with hiatal hernia, chronic constipation who is here for follow-up.  She is here alone today.  I last saw her in November 2020.  She reports on the whole she is doing quite well.  She continues to have regurgitation of fluid worse with activity and worse in the evening but no heartburn or pyrosis symptoms.  She is using omeprazole 40 mg every morning and occasionally a second dose in the evening when she can remember.  Again no heartburn.  No dysphagia or odynophagia.  She is having regular bowel movements with her senna preparation and she continues Apriso 1.5 g daily.  No blood in stool or melena.  No abdominal pain.  Good appetite.  No weight loss.  Good activity levels.   Review of Systems As per HPI, otherwise negative  Current Medications, Allergies, Past Medical History, Past Surgical History, Family History and Social History were reviewed in [employer] record.     Objective:   Physical Exam BP (!) 102/60   Pulse 80   Ht 5' (1.524 m)   Wt 122 lb (55.3 kg)   BMI 23.83 kg/m  Gen: awake, alert, NAD HEENT: anicteric CV: RRR, no mrg Pulm: CTA b/l Abd: soft, NT/ND, +BS throughout Ext: no c/c/e Neuro: nonfocal     Assessment & Plan:  85 year old female with a history of ulcerative colitis, colon polyps, GERD with hiatal hernia, chronic constipation who is here for follow-up.   1.  GERD/regurgitation --acid levels are controlled based on pH testing.  Manometry normal.  She is not interested in surgical antireflux procedure and thus we will continue medical therapy.  I think we can achieve adequate symptom control with once daily PPI --Reduce omeprazole to 40 mg once daily --Antireflux precautions  2.  UC --colitis has been in clinical remission on Apriso  continue 1.5 g daily.  Surveillance colonoscopy discontinued due to age and patient preference  3.  Chronic constipation --relieved by over-the-counter senna product, which she will continue  Annual follow-up, sooner if needed  20 minutes total spent today including patient facing time, coordination of care, reviewing medical history/procedures/pertinent radiology studies, and documentation of the encounter.

## 2020-06-06 NOTE — Patient Instructions (Signed)
Decrease omeprazole to 40 mg once daily.  Continue Apriso 4 tablets daily.  Follow up with Dr Hilarie Fredrickson in 1 year.  If you are age 83 or older, your body mass index should be between 23-30. Your Body mass index is 23.83 kg/m. If this is out of the aforementioned range listed, please consider follow up with your Primary Care Provider.  If you are age 26 or younger, your body mass index should be between 19-25. Your Body mass index is 23.83 kg/m. If this is out of the aformentioned range listed, please consider follow up with your Primary Care Provider.   Due to recent changes in healthcare laws, you may see the results of your imaging and laboratory studies on MyChart before your provider has had a chance to review them.  We understand that in some cases there may be results that are confusing or concerning to you. Not all laboratory results come back in the same time frame and the provider may be waiting for multiple results in order to interpret others.  Please give [place] 48 hours in order for your provider to thoroughly review all the results before contacting the office for clarification of your results.

## 2020-07-08 ENCOUNTER — Telehealth: Payer: Self-pay | Admitting: Internal Medicine

## 2020-07-08 MED ORDER — OMEPRAZOLE 40 MG PO CPDR: DELAYED_RELEASE_CAPSULE | ORAL | 4 refills | Status: DC

## 2020-07-08 NOTE — Telephone Encounter (Signed)
Rx sent 

## 2020-07-15 ENCOUNTER — Telehealth: Payer: Self-pay | Admitting: Internal Medicine

## 2020-07-15 MED ORDER — MESALAMINE ER 0.375 G PO CP24: 1.5000 g | ORAL_CAPSULE | Freq: Every day | ORAL | 0 refills | Status: DC

## 2020-07-15 NOTE — Telephone Encounter (Signed)
As per last rx note, needs office visit. Once she gets that scheduled, glad to fill. Thank you!

## 2020-07-15 NOTE — Telephone Encounter (Signed)
Rx sent 

## 2020-09-23 ENCOUNTER — Ambulatory Visit (INDEPENDENT_AMBULATORY_CARE_PROVIDER_SITE_OTHER): Payer: Medicare Other | Admitting: Internal Medicine

## 2020-09-23 ENCOUNTER — Encounter: Payer: Self-pay | Admitting: Internal Medicine

## 2020-09-23 VITALS — BP 122/80 | HR 91 | Ht 60.0 in | Wt 122.4 lb

## 2020-09-23 DIAGNOSIS — K5100 Ulcerative (chronic) pancolitis without complications: Secondary | ICD-10-CM | POA: Diagnosis not present

## 2020-09-23 DIAGNOSIS — K219 Gastro-esophageal reflux disease without esophagitis: Secondary | ICD-10-CM

## 2020-09-23 DIAGNOSIS — K5909 Other constipation: Secondary | ICD-10-CM

## 2020-09-23 MED ORDER — OMEPRAZOLE 40 MG PO CPDR: 40.0000 mg | DELAYED_RELEASE_CAPSULE | Freq: Two times a day (BID) | ORAL | 3 refills | Status: DC

## 2020-09-23 MED ORDER — MESALAMINE ER 0.375 G PO CP24: 1.5000 g | ORAL_CAPSULE | Freq: Every day | ORAL | 3 refills | Status: DC

## 2020-09-23 NOTE — Progress Notes (Signed)
° °  Subjective:    Patient ID: Erica Carter, female    DOB: [birth year], 85 y.o.   MRN: [id number]  HPI Erica Carter is an 85 year old female with a history of ulcerative colitis, colon polyps, GERD with hiatal hernia, chronic constipation who is here for follow-up.  She is here alone today and I last saw her in July 2021.  On the whole she reports she is doing well.  She continues to have regurgitation symptom worse at night.  This is despite having her head of bed elevated.  She eats dinner around 6 PM and lies down around 11 AM.  She is not having pyrosis or heartburn symptom just to the regurgitations.  No dysphagia or odynophagia.  No abdominal pain.  She continues to Apriso 1.5 g daily.  She does use senna daily which helps promote regular bowel movements without constipation.  No blood in stool or melena.  Dr. Brigitte Pulse recently started her on sertraline 25 mg which she takes in the evening.  This is helped considerably with stress and anxiousness she feels at home.  She lives with her daughter but her granddaughter who has 4 children, and and have the house a lot.  This increases her stress level   Review of Systems As per HPI, otherwise negative  Current Medications, Allergies, Past Medical History, Past Surgical History, Family History and Social History were reviewed in [employer] record.     Objective:   Physical Exam BP 122/80 (BP Location: Left Arm, Patient Position: Sitting, Cuff Size: Normal)    Pulse 91    Ht 5' (1.524 m)    Wt 122 lb 6.4 oz (55.5 kg)    SpO2 99%    BMI 23.90 kg/m  Gen: awake, alert, NAD HEENT: anicteric Abd: soft, NT/ND, +BS throughout Ext: no c/c/e Neuro: nonfocal      Assessment & Plan:  85 year old female with a history of ulcerative colitis, colon polyps, GERD with hiatal hernia, chronic constipation who is here for follow-up.   1. GERD/regurgitation -- symptoms stable on current therapy.  Acid levels also controlled on  therapy based on pH testing.  No alarm symptoms --continue omeprazole 40 mg twice daily (symptoms were worse with once daily dosing) --antireflux precautions  2. UC -- clinical remission on Apriso.  Surveillance colonoscopy discontinued due to age --continue Apriso 1.5 g daily  3. Chronic constipation -- continue OTC senna  Annual followup, sooner if needed  20 minutes total spent today including patient facing time, coordination of care, reviewing medical history/procedures/pertinent radiology studies, and documentation of the encounter.

## 2020-09-23 NOTE — Patient Instructions (Signed)
We have sent the following medications to your pharmacy for you to pick up at your convenience: Mesalamine Omeprazole  Continue Senna.  Continue B12.  Please follow up with Dr Hilarie Fredrickson in 1 year.  If you are age 83 or older, your body mass index should be between 23-30. Your Body mass index is 23.9 kg/m. If this is out of the aforementioned range listed, please consider follow up with your Primary Care Provider.  If you are age 29 or younger, your body mass index should be between 19-25. Your Body mass index is 23.9 kg/m. If this is out of the aformentioned range listed, please consider follow up with your Primary Care Provider.   Due to recent changes in healthcare laws, you may see the results of your imaging and laboratory studies on MyChart before your provider has had a chance to review them.  We understand that in some cases there may be results that are confusing or concerning to you. Not all laboratory results come back in the same time frame and the provider may be waiting for multiple results in order to interpret others.  Please give [place] 48 hours in order for your provider to thoroughly review all the results before contacting the office for clarification of your results.

## 2020-11-22 NOTE — Addendum Note (Signed)
Addended by: Lavena Bullion on: 11/22/2020 10:11 AM   Modules accepted: Orders

## 2020-12-06 DIAGNOSIS — M7989 Other specified soft tissue disorders: Secondary | ICD-10-CM | POA: Diagnosis not present

## 2020-12-06 DIAGNOSIS — M79605 Pain in left leg: Secondary | ICD-10-CM | POA: Diagnosis not present

## 2020-12-06 DIAGNOSIS — M7662 Achilles tendinitis, left leg: Secondary | ICD-10-CM | POA: Diagnosis not present

## 2020-12-06 DIAGNOSIS — I10 Essential (primary) hypertension: Secondary | ICD-10-CM | POA: Diagnosis not present

## 2020-12-16 DIAGNOSIS — E538 Deficiency of other specified B group vitamins: Secondary | ICD-10-CM | POA: Diagnosis not present

## 2021-01-13 DIAGNOSIS — E538 Deficiency of other specified B group vitamins: Secondary | ICD-10-CM | POA: Diagnosis not present

## 2021-01-27 DIAGNOSIS — S80812A Abrasion, left lower leg, initial encounter: Secondary | ICD-10-CM | POA: Diagnosis not present

## 2021-02-05 DIAGNOSIS — J309 Allergic rhinitis, unspecified: Secondary | ICD-10-CM | POA: Diagnosis not present

## 2021-02-05 DIAGNOSIS — E1122 Type 2 diabetes mellitus with diabetic chronic kidney disease: Secondary | ICD-10-CM | POA: Diagnosis not present

## 2021-02-05 DIAGNOSIS — E538 Deficiency of other specified B group vitamins: Secondary | ICD-10-CM | POA: Diagnosis not present

## 2021-02-05 DIAGNOSIS — N183 Chronic kidney disease, stage 3 unspecified: Secondary | ICD-10-CM | POA: Diagnosis not present

## 2021-02-05 DIAGNOSIS — E782 Mixed hyperlipidemia: Secondary | ICD-10-CM | POA: Diagnosis not present

## 2021-02-05 DIAGNOSIS — D692 Other nonthrombocytopenic purpura: Secondary | ICD-10-CM | POA: Diagnosis not present

## 2021-02-05 DIAGNOSIS — I129 Hypertensive chronic kidney disease with stage 1 through stage 4 chronic kidney disease, or unspecified chronic kidney disease: Secondary | ICD-10-CM | POA: Diagnosis not present

## 2021-02-25 DIAGNOSIS — H52203 Unspecified astigmatism, bilateral: Secondary | ICD-10-CM | POA: Diagnosis not present

## 2021-02-25 DIAGNOSIS — Z961 Presence of intraocular lens: Secondary | ICD-10-CM | POA: Diagnosis not present

## 2021-02-25 DIAGNOSIS — E119 Type 2 diabetes mellitus without complications: Secondary | ICD-10-CM | POA: Diagnosis not present

## 2021-02-28 ENCOUNTER — Emergency Department (HOSPITAL_COMMUNITY): Payer: Medicare Other

## 2021-02-28 ENCOUNTER — Other Ambulatory Visit: Payer: Self-pay

## 2021-02-28 ENCOUNTER — Encounter (HOSPITAL_COMMUNITY): Payer: Self-pay | Admitting: Emergency Medicine

## 2021-02-28 ENCOUNTER — Emergency Department (HOSPITAL_COMMUNITY)
Admission: EM | Admit: 2021-02-28 | Discharge: 2021-02-28 | Disposition: A | Discharge status: Home / Self Care | Payer: Medicare Other | Attending: Emergency Medicine | Admitting: Emergency Medicine

## 2021-02-28 DIAGNOSIS — Z87891 Personal history of nicotine dependence: Secondary | ICD-10-CM | POA: Insufficient documentation

## 2021-02-28 DIAGNOSIS — S40021A Contusion of right upper arm, initial encounter: Secondary | ICD-10-CM | POA: Diagnosis not present

## 2021-02-28 DIAGNOSIS — S50811A Abrasion of right forearm, initial encounter: Secondary | ICD-10-CM | POA: Diagnosis not present

## 2021-02-28 DIAGNOSIS — I10 Essential (primary) hypertension: Secondary | ICD-10-CM | POA: Diagnosis not present

## 2021-02-28 DIAGNOSIS — W19XXXA Unspecified fall, initial encounter: Secondary | ICD-10-CM

## 2021-02-28 DIAGNOSIS — W010XXA Fall on same level from slipping, tripping and stumbling without subsequent striking against object, initial encounter: Secondary | ICD-10-CM | POA: Diagnosis not present

## 2021-02-28 DIAGNOSIS — Z79899 Other long term (current) drug therapy: Secondary | ICD-10-CM | POA: Insufficient documentation

## 2021-02-28 DIAGNOSIS — I6782 Cerebral ischemia: Secondary | ICD-10-CM | POA: Insufficient documentation

## 2021-02-28 DIAGNOSIS — Z9889 Other specified postprocedural states: Secondary | ICD-10-CM | POA: Diagnosis not present

## 2021-02-28 DIAGNOSIS — S2231XA Fracture of one rib, right side, initial encounter for closed fracture: Secondary | ICD-10-CM

## 2021-02-28 DIAGNOSIS — S0990XA Unspecified injury of head, initial encounter: Secondary | ICD-10-CM | POA: Diagnosis not present

## 2021-02-28 DIAGNOSIS — S299XXA Unspecified injury of thorax, initial encounter: Secondary | ICD-10-CM | POA: Diagnosis present

## 2021-02-28 DIAGNOSIS — S5011XA Contusion of right forearm, initial encounter: Secondary | ICD-10-CM | POA: Diagnosis not present

## 2021-02-28 DIAGNOSIS — Z85828 Personal history of other malignant neoplasm of skin: Secondary | ICD-10-CM | POA: Insufficient documentation

## 2021-02-28 DIAGNOSIS — M25562 Pain in left knee: Secondary | ICD-10-CM | POA: Insufficient documentation

## 2021-02-28 DIAGNOSIS — R0781 Pleurodynia: Secondary | ICD-10-CM | POA: Diagnosis not present

## 2021-02-28 DIAGNOSIS — E119 Type 2 diabetes mellitus without complications: Secondary | ICD-10-CM | POA: Insufficient documentation

## 2021-02-28 DIAGNOSIS — Z7982 Long term (current) use of aspirin: Secondary | ICD-10-CM | POA: Insufficient documentation

## 2021-02-28 DIAGNOSIS — G319 Degenerative disease of nervous system, unspecified: Secondary | ICD-10-CM | POA: Diagnosis not present

## 2021-02-28 DIAGNOSIS — R9431 Abnormal electrocardiogram [ECG] [EKG]: Secondary | ICD-10-CM | POA: Diagnosis not present

## 2021-02-28 LAB — BASIC METABOLIC PANEL
Anion gap: 6 (ref 5–15)
BUN: 17 mg/dL (ref 8–23)
CO2: 26 mmol/L (ref 22–32)
Calcium: 8.8 mg/dL — ABNORMAL LOW (ref 8.9–10.3)
Chloride: 108 mmol/L (ref 98–111)
Creatinine, Ser: 0.84 mg/dL (ref 0.44–1.00)
GFR, Estimated: >60 mL/min (ref >60)
Glucose, Bld: 174 mg/dL — ABNORMAL HIGH (ref 70–99)
Potassium: 3.7 mmol/L (ref 3.5–5.1)
Sodium: 140 mmol/L (ref 135–145)

## 2021-02-28 LAB — CBC
HCT: 43.5 % (ref 36.0–46.0)
Hemoglobin: 13.9 g/dL (ref 12.0–15.0)
MCH: 29 pg (ref 26.0–34.0)
MCHC: 32 g/dL (ref 30.0–36.0)
MCV: 90.6 fL (ref 80.0–100.0)
Platelets: 253 10*3/uL (ref 150–400)
RBC: 4.8 MIL/uL (ref 3.87–5.11)
RDW: 14 % (ref 11.5–15.5)
WBC: 6.8 10*3/uL (ref 4.0–10.5)
nRBC: 0 % (ref 0.0–0.2)

## 2021-02-28 MED ORDER — ACETAMINOPHEN 325 MG PO TABS
650.0000 mg | ORAL_TABLET | Freq: Once | ORAL | Status: AC
  Administered 2021-02-28: 650 mg via ORAL
  Filled 2021-02-28: qty 2

## 2021-02-28 MED ORDER — SODIUM CHLORIDE 0.9% FLUSH: 3.0000 mL | Freq: Once | INTRAVENOUS | Status: DC

## 2021-02-28 NOTE — ED Notes (Signed)
Dc instructions reviewed with pt and family.  Both verbalized understanding. PT DC.

## 2021-02-28 NOTE — ED Triage Notes (Signed)
Emergency Medicine Provider Triage Evaluation Note  Erica Carter , a 86 y.o. female  was evaluated in triage.  Pt requesting evaluation following a fall that occurred shortly prior to arrival.  States she had just returned from a beach trip, was unpacking a car, fell onto the gravel.  She does not think she passed out, but is not sure.  Review of Systems  Positive: Pain to the right forearm, right upper arm, right shoulder left knee Negative: Dizziness, headache, syncope, chest pain, shortness of breath, abdominal pain  Physical Exam  BP 136/72 (BP Location: Left Arm)   Pulse 83   Temp 97.8 F (36.6 C) (Oral)   Resp 16   SpO2 97%  Gen:   Awake, no distress   HEENT:  Atraumatic  Resp:  Normal effort  Cardiac:  Normal rate  Abd:   Nondistended, nontender  MSK:   Tenderness, swelling, bruising to the right forearm, pain to the right upper arm.  No tenderness, swelling, deformity to the right shoulder.  No pain with range of motion of the right shoulder.  Tenderness to the left knee.  No midline spinal tenderness or pain. Neuro:  Speech clear, Moves extremities without difficulty   Medical Decision Making  Medically screening exam initiated at 4:51 PM.  Appropriate orders placed.  OUIDA ABEYTA was informed that the remainder of the evaluation will be completed by another provider, this initial triage assessment does not replace that evaluation, and the importance of remaining in the ED until their evaluation is complete.  Clinical Impression  Since we are uncertain as to whether or not patient underwent a syncopal episode and therefore are also not sure if she struck her head, additional orders and imaging were placed.     Lorayne Bender, PA-C 02/28/21 1705

## 2021-02-28 NOTE — ED Triage Notes (Addendum)
Pt states she fell at the back of car while unpacking from the beach. Unsure why she fell.  States she was on gravel and unsure if she passed out.  C/o R shoulder and L knee pain. Denies neck and back pain.  Abrasion to R forearm.

## 2021-02-28 NOTE — Discharge Instructions (Signed)
You may take tylenol and ibuprofen, available over the counter according to label instructions as needed for pain.

## 2021-02-28 NOTE — ED Provider Notes (Signed)
[facility] EMERGENCY DEPARTMENT Provider Note   CSN: [id number] Arrival date & time: 02/28/21  1646     History Chief Complaint  Patient presents with  . Fall    Erica Carter is a 86 y.o. female.  The history is provided by the patient.   Erica Carter is a 86 y.o. female who presents to the Emergency Department complaining of fall.  She presents to the ED for evaluation of injuries following a fall. She was unloading the vehicle after returning from a trip to the beach when she slid on gravel and fell on her right side. No head injury or loss of consciousness. She complains of pain to her right arm, right chest wall and left knee. No loss of consciousness or difficulty breathing. She did not lose consciousness. She did require assistance to get back up off the ground. She has been able to walk since this time.    Past Medical History:  Diagnosis Date  . Acute gastritis without mention of hemorrhage   . Diabetes mellitus without complication (HCC)    diet control, per pt  . Diverticulosis of colon (without mention of hemorrhage)   . Esophageal dysmotilities   . Esophageal reflux   . Hiatal hernia   . Other and unspecified hyperlipidemia   . Personal history of colonic polyps 09/11/2010   hyperplasic  . Skin cancer   . Stricture and stenosis of esophagus   . Ulcerative colitis, unspecified   . Unspecified essential hypertension   . Wears dentures    upper and lower  . Wears glasses     Patient Active Problem List   Diagnosis Date Noted  . Hiatal hernia   . Gastric polyp   . Status post epidural steroid injection 08/04/2017  . Unilateral primary osteoarthritis, left hip 06/17/2017  . History of gastroesophageal reflux (GERD) 10/26/2011  . GASTRITIS, ACUTE W/O HEMORRHAGE 09/11/2010  . HYPERLIPIDEMIA 02/12/2008  . Essential hypertension 02/12/2008  . GERD 02/12/2008  . 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 HIATAL HERNIA 05/17/2006  . COLITIS,  ULCERATIVE 05/17/2006  . DIVERTICULOSIS, COLON 05/17/2006    Past Surgical History:  Procedure Laterality Date  . BRAVO [place] STUDY N/A 09/07/2019   Procedure: BRAVO [place];  Surgeon: Lavena Bullion, DO;  Location: [facility];  Service: Gastroenterology;  Laterality: N/A;  . BUNIONECTOMY     Bil  . CATARACT EXTRACTION Bilateral   . ESOPHAGEAL MANOMETRY N/A 08/08/2019   Procedure: ESOPHAGEAL MANOMETRY (EM);  Surgeon: Mauri Pole, MD;  Location: [facility];  Service: Endoscopy;  Laterality: N/A;  . ESOPHAGOGASTRODUODENOSCOPY (EGD) WITH PROPOFOL N/A 09/07/2019   Procedure: ESOPHAGOGASTRODUODENOSCOPY (EGD) WITH PROPOFOL;  Surgeon: Lavena Bullion, DO;  Location: [facility];  Service: Gastroenterology;  Laterality: N/A;  . HEMOSTASIS CLIP PLACEMENT  09/07/2019   Procedure: HEMOSTASIS CLIP PLACEMENT;  Surgeon: Lavena Bullion, DO;  Location: [facility];  Service: Gastroenterology;;  . NASAL SINUS SURGERY    . POLYPECTOMY  09/07/2019   Procedure: POLYPECTOMY;  Surgeon: Lavena Bullion, DO;  Location: [facility];  Service: Gastroenterology;;  . THUMB ARTHROSCOPY Left   . TUBAL LIGATION       OB History   No obstetric history on file.     Family History  Problem Relation Age of Onset  . Thyroid cancer Brother        mets to brain  . Heart disease Brother   . Diabetes Mother   . Diabetes Sister   .  Heart disease Father   . Heart disease Sister   . Heart disease Brother   . Heart disease Brother   . Heart disease Brother   . Colon cancer Neg Hx   . Inflammatory bowel disease Neg Hx   . Liver disease Neg Hx   . Stomach cancer Neg Hx   . Pancreatic cancer Neg Hx     Social History   Tobacco Use  . Smoking status: Former Research scientist (life sciences)  . Smokeless tobacco: Never Used  . Tobacco comment: stopped over 20 years  Vaping Use  . Vaping Use: Never used  Substance Use Topics  . Alcohol use: No  . Drug use: No    Home Medications Prior to Admission  medications   Medication Sig Start Date End Date Taking? Authorizing Provider  amLODipine (NORVASC) 5 MG tablet Take 5 mg by mouth daily.    [provider]  aspirin 81 MG tablet Take 81 mg by mouth daily.    [provider]  atorvastatin (LIPITOR) 20 MG tablet Take 20 mg by mouth daily.     [provider]  calcium gluconate 500 MG tablet Take 500 mg by mouth daily.      [provider]  cholecalciferol (VITAMIN D) 1000 UNITS tablet Take 1,000 Units by mouth daily.      [provider]  Cyanocobalamin (VITAMIN B-12 IJ) Inject 1,000 mcg as directed every 30 (thirty) days. Pt gets shot once a month     [provider]  losartan (COZAAR) 100 MG tablet Take 100 mg by mouth daily.      [provider]  mesalamine (APRISO) 0.375 g 24 hr capsule Take 4 capsules (1.5 g total) by mouth daily. 09/23/20   Pyrtle, Lajuan Lines, MD  Misc Natural Products (COLON CARE PO) Take 1 tablet by mouth daily as needed (constipation).     [provider]  Multiple Vitamins-Minerals (CENTRUM SILVER PO) Take 1 tablet by mouth daily.      [provider]  omeprazole (PRILOSEC) 40 MG capsule Take 1 capsule (40 mg total) by mouth in the morning and at bedtime. 09/23/20   Pyrtle, Lajuan Lines, MD  vitamin B-12 (CYANOCOBALAMIN) 500 MCG tablet Take 500 mcg by mouth daily.    [provider]    Allergies    Codeine  Review of Systems   Review of Systems  All other systems reviewed and are negative.   Physical Exam Updated Vital Signs BP (!) 154/107 (BP Location: Left Arm)   Pulse 73   Temp 97.8 F (36.6 C) (Oral)   Resp 16   SpO2 100%   Physical Exam Vitals and nursing note reviewed.  Constitutional:      Appearance: She is well-developed.  HENT:     Head: Normocephalic and atraumatic.  Cardiovascular:     Rate and Rhythm: Normal rate and regular rhythm.     Heart sounds: No murmur heard.   Pulmonary:     Effort: Pulmonary  effort is normal. No respiratory distress.     Breath sounds: Normal breath sounds.  Chest:     Chest wall: Tenderness present.  Abdominal:     Palpations: Abdomen is soft.     Tenderness: There is no abdominal tenderness. There is no guarding or rebound.  Musculoskeletal:     Comments: There abrasions over the right forearm with range of motion intact and bilateral shoulders, elbows, wrist. Mild tenderness to palpation over the right forearm.  Skin:  General: Skin is warm and dry.     Comments: Sunburn to the arms, legs.  Neurological:     Mental Status: She is alert and oriented to person, place, and time.     Comments: Normal gait. Five out of five strength in all four extremities  Psychiatric:        Behavior: Behavior normal.     ED Results / Procedures / Treatments   Labs (all labs ordered are listed, but only abnormal results are displayed) Labs Reviewed  BASIC METABOLIC PANEL - Abnormal; Notable for the following components:      Result Value   Glucose, Bld 174 (*)    Calcium 8.8 (*)    All other components within normal limits  CBC    EKG EKG Interpretation  Date/Time:  Saturday February 28 2021 16:57:42 EDT Ventricular Rate:  81 PR Interval:  162 QRS Duration: 110 QT Interval:  394 QTC Calculation: 457 R Axis:   -28 Text Interpretation: Normal sinus rhythm Minimal voltage criteria for LVH, may be normal variant ( Cornell product ) Anterior infarct , age undetermined Abnormal ECG Confirmed by Quintella Reichert [phone]) on 02/28/2021 6:25:57 PM   Radiology DG Ribs Unilateral W/Chest Right  Result Date: 02/28/2021 CLINICAL DATA:  Fall.  Right rib pain. EXAM: RIGHT RIBS AND CHEST - 3+ VIEW COMPARISON:  None. FINDINGS: Possible nondisplaced right anterior eighth rib fracture. No other rib fractures. There is no evidence of pneumothorax or pleural effusion. Both lungs are clear. Heart size and mediastinal contours are within normal limits. IMPRESSION: Possible  nondisplaced right anterior eighth rib fracture. No pulmonary complication such as pneumothorax. Electronically Signed   By: Keith Rake M.D.   On: 02/28/2021 19:51   DG Forearm Right  Result Date: 02/28/2021 CLINICAL DATA:  Fall with swelling and bruising. EXAM: RIGHT FOREARM - 2 VIEW COMPARISON:  None. FINDINGS: Cortical margins of the radius and ulna are intact. There is no evidence of fracture or other focal bone lesions. Elbow and wrist alignment are maintained. Osteoarthritis at the wrist and base of the thumb, partially included. Mild radial soft tissue edema. IMPRESSION: Mild radial soft tissue edema. No fracture of the forearm. Electronically Signed   By: Keith Rake M.D.   On: 02/28/2021 17:25   CT Head Wo Contrast  Result Date: 02/28/2021 CLINICAL DATA:  Head trauma, status post fall. EXAM: CT HEAD WITHOUT CONTRAST TECHNIQUE: Contiguous axial images were obtained from the base of the skull through the vertex without intravenous contrast. COMPARISON:  Brain MRI 04/29/2012 FINDINGS: Brain: Generalized atrophy, normal for age. Mild chronic small vessel ischemia. No intracranial hemorrhage, mass effect, or midline shift. No hydrocephalus. The basilar cisterns are patent. No evidence of territorial infarct or acute ischemia. No extra-axial or intracranial fluid collection. Vascular: Atherosclerosis of skullbase vasculature without hyperdense vessel or abnormal calcification. Skull: No fracture or focal lesion. Sinuses/Orbits: Chronic opacification of right mastoid air cells. Postsurgical change of the paranasal sinuses. No acute findings. Bilateral cataract resection. Other: None. IMPRESSION: 1. No acute intracranial abnormality. No skull fracture. 2. Age related atrophy and chronic small vessel ischemia. Electronically Signed   By: Keith Rake M.D.   On: 02/28/2021 18:04   DG Knee Complete 4 Views Left  Result Date: 02/28/2021 CLINICAL DATA:  Post fall.  Left knee pain. EXAM: LEFT  KNEE - COMPLETE 4+ VIEW COMPARISON:  None. FINDINGS: No evidence of fracture, dislocation, or joint effusion. Normal joint spaces and alignment. Mild medial compartment spurring is less than  typically seen for age. Mild chondrocalcinosis. Soft tissues are unremarkable. IMPRESSION: 1. No fracture or subluxation of the left knee. 2. Mild chondrocalcinosis. Electronically Signed   By: Keith Rake M.D.   On: 02/28/2021 17:26   DG Humerus Right  Result Date: 02/28/2021 CLINICAL DATA:  Fall with swelling and bruising. EXAM: RIGHT HUMERUS - 2+ VIEW COMPARISON:  None. FINDINGS: Cortical margins of the humerus are intact. There is no evidence of fracture or other focal bone lesions. Shoulder and elbow alignment are maintained. Soft tissues are unremarkable. IMPRESSION: No fracture of the humerus. Electronically Signed   By: Keith Rake M.D.   On: 02/28/2021 17:26    Procedures Procedures   Medications Ordered in ED Medications  sodium chloride flush (NS) 0.9 % injection 3 mL (has no administration in time range)  acetaminophen (TYLENOL) tablet 650 mg (has no administration in time range)    ED Course  I have reviewed the triage vital signs and the nursing notes.  Pertinent labs & imaging results that were available during my care of the patient were reviewed by me and considered in my medical decision making (see chart for details).    MDM Rules/Calculators/A&P                         patient here for evaluation of injuries following a mechanical fall. No evidence of acute long bone fracture or serious intracranial injury. She does have a single isolated rib fracture. She has no splinting on examination is able to sit up and walk around the room without difficulty. Discussed with patient home care for abrasions following a fall as well as rib fracture. Discussed outpatient follow-up and return precautions.  Final Clinical Impression(s) / ED Diagnoses Final diagnoses:  Fall, initial  encounter  Closed fracture of one rib of right side, initial encounter    Rx / DC Orders ED Discharge Orders    None       Quintella Reichert, MD 02/28/21 2008

## 2021-03-05 DIAGNOSIS — E538 Deficiency of other specified B group vitamins: Secondary | ICD-10-CM | POA: Diagnosis not present

## 2021-04-02 DIAGNOSIS — E538 Deficiency of other specified B group vitamins: Secondary | ICD-10-CM | POA: Diagnosis not present

## 2021-04-30 DIAGNOSIS — E538 Deficiency of other specified B group vitamins: Secondary | ICD-10-CM | POA: Diagnosis not present

## 2021-05-02 ENCOUNTER — Other Ambulatory Visit: Payer: Self-pay

## 2021-05-02 ENCOUNTER — Emergency Department (HOSPITAL_COMMUNITY): Payer: Medicare Other

## 2021-05-02 ENCOUNTER — Encounter (HOSPITAL_COMMUNITY): Payer: Self-pay | Admitting: Emergency Medicine

## 2021-05-02 ENCOUNTER — Emergency Department (HOSPITAL_COMMUNITY)
Admission: EM | Admit: 2021-05-02 | Discharge: 2021-05-02 | Disposition: A | Discharge status: Home / Self Care | Payer: Medicare Other | Attending: Emergency Medicine | Admitting: Emergency Medicine

## 2021-05-02 DIAGNOSIS — S52611A Displaced fracture of right ulna styloid process, initial encounter for closed fracture: Secondary | ICD-10-CM | POA: Diagnosis not present

## 2021-05-02 DIAGNOSIS — W01198A Fall on same level from slipping, tripping and stumbling with subsequent striking against other object, initial encounter: Secondary | ICD-10-CM | POA: Insufficient documentation

## 2021-05-02 DIAGNOSIS — S6991XA Unspecified injury of right wrist, hand and finger(s), initial encounter: Secondary | ICD-10-CM | POA: Diagnosis present

## 2021-05-02 DIAGNOSIS — Z79899 Other long term (current) drug therapy: Secondary | ICD-10-CM | POA: Diagnosis not present

## 2021-05-02 DIAGNOSIS — Z7982 Long term (current) use of aspirin: Secondary | ICD-10-CM | POA: Insufficient documentation

## 2021-05-02 DIAGNOSIS — I10 Essential (primary) hypertension: Secondary | ICD-10-CM | POA: Insufficient documentation

## 2021-05-02 DIAGNOSIS — S59291A Other physeal fracture of lower end of radius, right arm, initial encounter for closed fracture: Secondary | ICD-10-CM | POA: Insufficient documentation

## 2021-05-02 DIAGNOSIS — M25531 Pain in right wrist: Secondary | ICD-10-CM | POA: Diagnosis not present

## 2021-05-02 DIAGNOSIS — W19XXXA Unspecified fall, initial encounter: Secondary | ICD-10-CM

## 2021-05-02 DIAGNOSIS — E119 Type 2 diabetes mellitus without complications: Secondary | ICD-10-CM | POA: Insufficient documentation

## 2021-05-02 DIAGNOSIS — S59201A Unspecified physeal fracture of lower end of radius, right arm, initial encounter for closed fracture: Secondary | ICD-10-CM | POA: Diagnosis not present

## 2021-05-02 DIAGNOSIS — M25532 Pain in left wrist: Secondary | ICD-10-CM | POA: Insufficient documentation

## 2021-05-02 DIAGNOSIS — Z87891 Personal history of nicotine dependence: Secondary | ICD-10-CM | POA: Insufficient documentation

## 2021-05-02 DIAGNOSIS — Z85828 Personal history of other malignant neoplasm of skin: Secondary | ICD-10-CM | POA: Diagnosis not present

## 2021-05-02 DIAGNOSIS — S52614A Nondisplaced fracture of right ulna styloid process, initial encounter for closed fracture: Secondary | ICD-10-CM | POA: Diagnosis not present

## 2021-05-02 MED ORDER — ACETAMINOPHEN 325 MG PO TABS
650.0000 mg | ORAL_TABLET | Freq: Once | ORAL | Status: AC
  Administered 2021-05-02: 650 mg via ORAL
  Filled 2021-05-02: qty 2

## 2021-05-02 NOTE — ED Provider Notes (Signed)
Emergency Medicine Provider Triage Evaluation Note  Erica Carter , a 86 y.o. female  was evaluated in triage.  Pt complains of right wrist pain.  Patient states that she got out of bed last night, and on her way back to the bed lost her footing and hit her right wrist on the nightstand.  She denies any her head or loss of consciousness.  She complains of right wrist swelling and pain.  Denies any numbness or tingling.  No visible open fracture  Review of Systems  Positive: As above Negative: As above  Physical Exam  BP 131/68 (BP Location: Left Arm)   Pulse 76   Temp 98.3 F (36.8 C) (Oral)   Resp 16   SpO2 93%  Gen:   Awake, no distress   Resp:  Normal effort  MSK:   Moves extremities without difficulty  Other:  Right wrist with moderate swelling , 2+ radial pulses, restricted range of motion secondary to pain.  No significant overlying warmth.  Able to move all 5 digits without difficulty.  Medical Decision Making  Medically screening exam initiated at 10:52 AM.  Appropriate orders placed.  Erica Carter was informed that the remainder of the evaluation will be completed by another provider, this initial triage assessment does not replace that evaluation, and the importance of remaining in the ED until their evaluation is complete.     Garald Balding, PA-C 05/02/21 1054    Tegeler, Gwenyth Allegra, MD 05/02/21 [phone]

## 2021-05-02 NOTE — Discharge Instructions (Addendum)
Follow up with Orthopedics. If you do not have one, I have referred you to 1.  You will need to call to schedule an appointment.  Take tylenol or Ibuprofen for pain  Return for new or worsening symptoms

## 2021-05-02 NOTE — ED Notes (Signed)
Called Ortho to apply wrist brace- Left message.

## 2021-05-02 NOTE — Progress Notes (Signed)
Orthopedic Tech Progress Note Patient Details:  Erica Carter 07/13/1935 [id number]  Ortho Devices Type of Ortho Device: Sugartong splint, Arm sling Ortho Device/Splint Location: [facility] Ortho Device/Splint Interventions: Ordered, Application   Post Interventions Patient Tolerated: Well Instructions Provided: Care of device, Poper ambulation with device  Maikol Grassia A [name] 05/02/2021, 1:41 PM

## 2021-05-02 NOTE — ED Notes (Signed)
Pt taken to xray at this time.

## 2021-05-02 NOTE — ED Provider Notes (Signed)
[facility] EMERGENCY DEPARTMENT Provider Note   CSN: [id number] Arrival date & time: 05/02/21  1028     History Chief Complaint  Patient presents with  . Wrist Pain    Erica Carter is a 86 y.o. female with history significant for diabetes who presents for evaluation of trip and fall.  Patient states she thought she saw a light on in her house overnight around 1 AM.  She subsequently got off the couch where she sleeps and turn quickly and almost fell. She states she hit the dorsal aspect of her right wrist on the edge of a counter. States she did not fall to the ground.  Did not hit her head, synopsize or have LOC.  Denies any anticoagulation aside from a baby aspirin. She only has pain to her right wrist.  She is able to move it however has some pain to the radial aspect right wrist with movement.  She is noted some mild swelling.  No numbness, tingling, redness, warmth.  She did not sustain any lacerations or abrasions from her injury.  Has not taken anything for symptoms.  She does not anything for pain at this time however rates her pain a 7/10.  Denies any pain to distal hand, midshaft, proximal forearm.  Denies additional aggravating or alleviating factors.  History obtained from patient and past medical records.  No interpreter used.  HPI     Past Medical History:  Diagnosis Date  . Acute gastritis without mention of hemorrhage   . Diabetes mellitus without complication (HCC)    diet control, per pt  . Diverticulosis of colon (without mention of hemorrhage)   . Esophageal dysmotilities   . Esophageal reflux   . Hiatal hernia   . Other and unspecified hyperlipidemia   . Personal history of colonic polyps 09/11/2010   hyperplasic  . Skin cancer   . Stricture and stenosis of esophagus   . Ulcerative colitis, unspecified   . Unspecified essential hypertension   . Wears dentures    upper and lower  . Wears glasses     Patient Active Problem List    Diagnosis Date Noted  . Hiatal hernia   . Gastric polyp   . Status post epidural steroid injection 08/04/2017  . Unilateral primary osteoarthritis, left hip 06/17/2017  . History of gastroesophageal reflux (GERD) 10/26/2011  . GASTRITIS, ACUTE W/O HEMORRHAGE 09/11/2010  . HYPERLIPIDEMIA 02/12/2008  . Essential hypertension 02/12/2008  . GERD 02/12/2008  . 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 HIATAL HERNIA 05/17/2006  . COLITIS, ULCERATIVE 05/17/2006  . DIVERTICULOSIS, COLON 05/17/2006    Past Surgical History:  Procedure Laterality Date  . BRAVO [place] STUDY N/A 09/07/2019   Procedure: BRAVO [place];  Surgeon: Lavena Bullion, DO;  Location: [facility];  Service: Gastroenterology;  Laterality: N/A;  . BUNIONECTOMY     Bil  . CATARACT EXTRACTION Bilateral   . ESOPHAGEAL MANOMETRY N/A 08/08/2019   Procedure: ESOPHAGEAL MANOMETRY (EM);  Surgeon: Mauri Pole, MD;  Location: [facility];  Service: Endoscopy;  Laterality: N/A;  . ESOPHAGOGASTRODUODENOSCOPY (EGD) WITH PROPOFOL N/A 09/07/2019   Procedure: ESOPHAGOGASTRODUODENOSCOPY (EGD) WITH PROPOFOL;  Surgeon: Lavena Bullion, DO;  Location: [facility];  Service: Gastroenterology;  Laterality: N/A;  . HEMOSTASIS CLIP PLACEMENT  09/07/2019   Procedure: HEMOSTASIS CLIP PLACEMENT;  Surgeon: Lavena Bullion, DO;  Location: [facility];  Service: Gastroenterology;;  . NASAL SINUS SURGERY    . POLYPECTOMY  09/07/2019   Procedure: POLYPECTOMY;  Surgeon: Lavena Bullion, DO;  Location: [facility];  Service: Gastroenterology;;  . THUMB ARTHROSCOPY Left   . TUBAL LIGATION       OB History   No obstetric history on file.     Family History  Problem Relation Age of Onset  . Thyroid cancer Brother        mets to brain  . Heart disease Brother   . Diabetes Mother   . Diabetes Sister   . Heart disease Father   . Heart disease Sister   . Heart disease Brother   . Heart disease Brother   . Heart disease Brother   .  Colon cancer Neg Hx   . Inflammatory bowel disease Neg Hx   . Liver disease Neg Hx   . Stomach cancer Neg Hx   . Pancreatic cancer Neg Hx     Social History   Tobacco Use  . Smoking status: Former    Pack years: 0.00  . Smokeless tobacco: Never  . Tobacco comments:    stopped over 20 years  Vaping Use  . Vaping Use: Never used  Substance Use Topics  . Alcohol use: No  . Drug use: No    Home Medications Prior to Admission medications   Medication Sig Start Date End Date Taking? Authorizing Provider  amLODipine (NORVASC) 5 MG tablet Take 5 mg by mouth daily.    [provider]  aspirin 81 MG tablet Take 81 mg by mouth daily.    [provider]  atorvastatin (LIPITOR) 20 MG tablet Take 20 mg by mouth daily.     [provider]  calcium gluconate 500 MG tablet Take 500 mg by mouth daily.      [provider]  cholecalciferol (VITAMIN D) 1000 UNITS tablet Take 1,000 Units by mouth daily.      [provider]  Cyanocobalamin (VITAMIN B-12 IJ) Inject 1,000 mcg as directed every 30 (thirty) days. Pt gets shot once a month     [provider]  losartan (COZAAR) 100 MG tablet Take 100 mg by mouth daily.      [provider]  mesalamine (APRISO) 0.375 g 24 hr capsule Take 4 capsules (1.5 g total) by mouth daily. 09/23/20   Pyrtle, Lajuan Lines, MD  Misc Natural Products (COLON CARE PO) Take 1 tablet by mouth daily as needed (constipation).     [provider]  Multiple Vitamins-Minerals (CENTRUM SILVER PO) Take 1 tablet by mouth daily.      [provider]  omeprazole (PRILOSEC) 40 MG capsule Take 1 capsule (40 mg total) by mouth in the morning and at bedtime. 09/23/20   Pyrtle, Lajuan Lines, MD  vitamin B-12 (CYANOCOBALAMIN) 500 MCG tablet Take 500 mcg by mouth daily.    [provider]    Allergies    Codeine  Review of Systems   Review of Systems  Constitutional: Negative.   HENT: Negative.     Respiratory: Negative.    Cardiovascular: Negative.   Gastrointestinal: Negative.   Genitourinary: Negative.   Musculoskeletal:  Negative for arthralgias, back pain, gait problem, joint swelling, myalgias, neck pain and neck stiffness.       Right wrist pain  Skin: Negative.   Neurological: Negative.   All other systems reviewed and are negative.  Physical Exam Updated Vital Signs BP 131/68 (BP Location: Left Arm)   Pulse 76   Temp 98.3 F (36.8 C) (Oral)   Resp 16   SpO2 93%  Physical Exam Vitals and nursing note reviewed.  Constitutional:      General: She is not in acute distress.    Appearance: She is well-developed. She is not ill-appearing, toxic-appearing or diaphoretic.  HENT:     Head: Normocephalic and atraumatic.     Comments: No obvious traumatic head injuries    Nose: Nose normal.  Eyes:     Pupils: Pupils are equal, round, and reactive to light.  Cardiovascular:     Rate and Rhythm: Normal rate.     Pulses: Normal pulses.          Radial pulses are 2+ on the right side and 2+ on the left side.  Pulmonary:     Effort: Pulmonary effort is normal. No respiratory distress.  Chest:     Comments: Equal rise and fall to chest wall.  Nontender anterior posterior ribs Abdominal:     General: There is no distension.     Palpations: Abdomen is soft.     Tenderness: There is no abdominal tenderness.     Comments: Soft, nontender no rebound or guarding  Musculoskeletal:        General: Normal range of motion.       Hands:     Cervical back: Normal range of motion.     Comments: No midline spinal tenderness, step-off.  Tenderness to radial aspect right wrist at scaphoid.  Nontender metacarpals, digits right upper extremity.  Able to flex, extend, pronate and supinate at wrist however pain with movement. No tenderness to forearm.  Able to flex and extend at bilateral elbows.  Able to lift shoulder overhead.  Nontender humerus.  Pelvis stable, nontender palpation.  No  bony tenderness lower extremities.   Skin:    General: Skin is warm and dry.     Comments: Mild swelling to right dorsal wrist.  No erythema, warmth, lacerations, abrasions or contusions.  Neurological:     General: No focal deficit present.     Mental Status: She is alert.     Cranial Nerves: Cranial nerves are intact.     Sensory: Sensation is intact.     Motor: Motor function is intact.     Gait: Gait is intact.  Psychiatric:        Mood and Affect: Mood normal.    ED Results / Procedures / Treatments   Labs (all labs ordered are listed, but only abnormal results are displayed) Labs Reviewed - No data to display  EKG None  Radiology DG Wrist Complete Right  Result Date: 05/02/2021 CLINICAL DATA:  Fall.  Right wrist pain. EXAM: RIGHT WRIST - COMPLETE 3+ VIEW COMPARISON:  None. FINDINGS: Subtle transverse nondisplaced, non comminuted fracture of the distal radial metaphysis, reflected by trabecular irregularity and dorsal ulnar cortical buckling. There is a nondisplaced fracture across the base of the ulnar styloid. No significant distal radial articular surface angulation. No dislocation. No other fractures and no bone lesions. There are degenerative changes of the scaphoid trapezium trapezoid articulations and the trapezium first metacarpal articulation. Skeletal structures are diffusely demineralized. IMPRESSION: 1. Nondisplaced, non comminuted transverse fracture of the distal radial metaphysis with an associated nondisplaced ulnar styloid fracture. Electronically Signed   By: Lajean Manes M.D.   On: 05/02/2021 12:48    Procedures .Splint Application  Date/Time: 05/02/2021 2:23 PM Performed by: Nettie Elm, PA-C Authorized by: Nettie Elm, PA-C   Consent:    Consent obtained:  Verbal   Consent given by:  Patient  Risks, benefits, and alternatives were discussed: yes     Risks discussed:  Discoloration, numbness, pain and swelling   Alternatives discussed:   No treatment, delayed treatment, alternative treatment, referral and observation Universal protocol:    Procedure explained and questions answered to patient or proxy's satisfaction: yes     Relevant documents present and verified: yes     Test results available: yes     Imaging studies available: yes     Required blood products, implants, devices, and special equipment available: yes     Site/side marked: yes     Immediately prior to procedure a time out was called: yes     Patient identity confirmed:  Verbally with patient Pre-procedure details:    Distal neurologic exam:  Normal   Distal perfusion: distal pulses strong and brisk capillary refill   Procedure details:    Location:  Wrist   Wrist location:  R wrist   Strapping: no     Cast type:  Short arm   Splint type:  Sugar tong   Supplies:  Sling, fiberglass, elastic bandage and cotton padding   Attestation: Splint applied and adjusted personally by me   Post-procedure details:    Distal neurologic exam:  Normal   Procedure completion:  Tolerated well, no immediate complications   Post-procedure imaging: not applicable     Medications Ordered in ED Medications  acetaminophen (TYLENOL) tablet 650 mg (650 mg Oral Given 05/02/21 1233)   ED Course  I have reviewed the triage vital signs and the nursing notes.  Pertinent labs & imaging results that were available during my care of the patient were reviewed by me and considered in my medical decision making (see chart for details).  86 year old here for evaluation of mechanical fall which occurred yesterday.  She did not fall to the ground.  Denies hitting head, LOC, anticoagulation.  Denies any presyncopal type symptoms.  States this is purely a mechanical fall. Hit dorsal aspect right wrist.  She has had some mild swelling.  She is neurovascularly intact.  She is able to move at her wrist however has some pain with flexion and extension.  She has equal grip strength bilaterally.   She has no overlying lacerations or abrasions.  No bony tenderness to distal hand, midshaft, proximal forearm.  Does have some tenderness at her scaphoid. We will plan on x-ray right wrist  X-ray right wrist which I personally reviewed and interpreted shows distal radial metaphyseal fracture with ulnar styloid fracture.   Splint placed will have her FU with Ortho. NV intact after splint placement.  The patient has been appropriately medically screened and/or stabilized in the ED. I have low suspicion for any other emergent medical condition which would require further screening, evaluation or treatment in the ED or require inpatient management.  Patient is hemodynamically stable and in no acute distress.  Patient able to ambulate in department prior to ED.  Evaluation does not show acute pathology that would require ongoing or additional emergent interventions while in the emergency department or further inpatient treatment.  I have discussed the diagnosis with the patient and answered all questions.  Pain is been managed while in the emergency department and patient has no further complaints prior to discharge.  Patient is comfortable with plan discussed in room and is stable for discharge at this time.  I have discussed strict return precautions for returning to the emergency department.  Patient was encouraged to follow-up with PCP/specialist refer to at discharge.  MDM Rules/Calculators/A&P                           Final Clinical Impression(s) / ED Diagnoses Final diagnoses:  Fall, initial encounter  Nondisplaced physeal fracture of distal end of right radius, initial encounter  Closed nondisplaced fracture of styloid process of right ulna, initial encounter    Rx / DC Orders ED Discharge Orders     None        Dailee Manalang A, PA-C 05/02/21 1425    Isla Pence, MD 05/02/21 1506

## 2021-05-02 NOTE — ED Triage Notes (Signed)
Pt loss balance last night and fell hitting her R wrist/hand on dresser.  C/o pain and swelling to R wrist.  CMS intact.

## 2021-05-07 DIAGNOSIS — S52501A Unspecified fracture of the lower end of right radius, initial encounter for closed fracture: Secondary | ICD-10-CM | POA: Diagnosis not present

## 2021-05-07 DIAGNOSIS — S52614A Nondisplaced fracture of right ulna styloid process, initial encounter for closed fracture: Secondary | ICD-10-CM | POA: Diagnosis not present

## 2021-05-08 DIAGNOSIS — M25531 Pain in right wrist: Secondary | ICD-10-CM | POA: Diagnosis not present

## 2021-05-08 DIAGNOSIS — S52614A Nondisplaced fracture of right ulna styloid process, initial encounter for closed fracture: Secondary | ICD-10-CM | POA: Diagnosis not present

## 2021-05-08 DIAGNOSIS — S52521D Torus fracture of lower end of right radius, subsequent encounter for fracture with routine healing: Secondary | ICD-10-CM | POA: Diagnosis not present

## 2021-05-08 DIAGNOSIS — S52501A Unspecified fracture of the lower end of right radius, initial encounter for closed fracture: Secondary | ICD-10-CM | POA: Diagnosis not present

## 2021-05-28 DIAGNOSIS — E538 Deficiency of other specified B group vitamins: Secondary | ICD-10-CM | POA: Diagnosis not present

## 2021-06-05 DIAGNOSIS — S52591D Other fractures of lower end of right radius, subsequent encounter for closed fracture with routine healing: Secondary | ICD-10-CM | POA: Diagnosis not present

## 2021-06-05 DIAGNOSIS — S52521D Torus fracture of lower end of right radius, subsequent encounter for fracture with routine healing: Secondary | ICD-10-CM | POA: Diagnosis not present

## 2021-06-25 DIAGNOSIS — E538 Deficiency of other specified B group vitamins: Secondary | ICD-10-CM | POA: Diagnosis not present

## 2021-07-03 DIAGNOSIS — S52591D Other fractures of lower end of right radius, subsequent encounter for closed fracture with routine healing: Secondary | ICD-10-CM | POA: Diagnosis not present

## 2021-07-23 DIAGNOSIS — E538 Deficiency of other specified B group vitamins: Secondary | ICD-10-CM | POA: Diagnosis not present

## 2021-08-17 DIAGNOSIS — G4700 Insomnia, unspecified: Secondary | ICD-10-CM | POA: Diagnosis not present

## 2021-08-17 DIAGNOSIS — E1122 Type 2 diabetes mellitus with diabetic chronic kidney disease: Secondary | ICD-10-CM | POA: Diagnosis not present

## 2021-08-17 DIAGNOSIS — D692 Other nonthrombocytopenic purpura: Secondary | ICD-10-CM | POA: Diagnosis not present

## 2021-08-17 DIAGNOSIS — D519 Vitamin B12 deficiency anemia, unspecified: Secondary | ICD-10-CM | POA: Diagnosis not present

## 2021-08-17 DIAGNOSIS — Z23 Encounter for immunization: Secondary | ICD-10-CM | POA: Diagnosis not present

## 2021-08-17 DIAGNOSIS — I129 Hypertensive chronic kidney disease with stage 1 through stage 4 chronic kidney disease, or unspecified chronic kidney disease: Secondary | ICD-10-CM | POA: Diagnosis not present

## 2021-08-17 DIAGNOSIS — K5190 Ulcerative colitis, unspecified, without complications: Secondary | ICD-10-CM | POA: Diagnosis not present

## 2021-08-17 DIAGNOSIS — R413 Other amnesia: Secondary | ICD-10-CM | POA: Diagnosis not present

## 2021-08-17 DIAGNOSIS — N183 Chronic kidney disease, stage 3 unspecified: Secondary | ICD-10-CM | POA: Diagnosis not present

## 2021-08-17 DIAGNOSIS — Z0000 Encounter for general adult medical examination without abnormal findings: Secondary | ICD-10-CM | POA: Diagnosis not present

## 2021-08-17 DIAGNOSIS — E782 Mixed hyperlipidemia: Secondary | ICD-10-CM | POA: Diagnosis not present

## 2021-09-03 ENCOUNTER — Encounter (HOSPITAL_COMMUNITY): Payer: Self-pay | Admitting: Emergency Medicine

## 2021-09-03 ENCOUNTER — Emergency Department (HOSPITAL_COMMUNITY): Payer: Medicare Other

## 2021-09-03 ENCOUNTER — Other Ambulatory Visit: Payer: Self-pay

## 2021-09-03 ENCOUNTER — Emergency Department (HOSPITAL_COMMUNITY)
Admission: EM | Admit: 2021-09-03 | Discharge: 2021-09-04 | Disposition: A | Discharge status: Home / Self Care | Payer: Medicare Other | Attending: Emergency Medicine | Admitting: Emergency Medicine

## 2021-09-03 DIAGNOSIS — Z79899 Other long term (current) drug therapy: Secondary | ICD-10-CM | POA: Diagnosis not present

## 2021-09-03 DIAGNOSIS — Z87891 Personal history of nicotine dependence: Secondary | ICD-10-CM | POA: Diagnosis not present

## 2021-09-03 DIAGNOSIS — E119 Type 2 diabetes mellitus without complications: Secondary | ICD-10-CM | POA: Insufficient documentation

## 2021-09-03 DIAGNOSIS — S92354A Nondisplaced fracture of fifth metatarsal bone, right foot, initial encounter for closed fracture: Secondary | ICD-10-CM | POA: Insufficient documentation

## 2021-09-03 DIAGNOSIS — M79671 Pain in right foot: Secondary | ICD-10-CM | POA: Diagnosis not present

## 2021-09-03 DIAGNOSIS — W1842XA Slipping, tripping and stumbling without falling due to stepping into hole or opening, initial encounter: Secondary | ICD-10-CM | POA: Insufficient documentation

## 2021-09-03 DIAGNOSIS — I10 Essential (primary) hypertension: Secondary | ICD-10-CM | POA: Diagnosis not present

## 2021-09-03 DIAGNOSIS — Z7982 Long term (current) use of aspirin: Secondary | ICD-10-CM | POA: Diagnosis not present

## 2021-09-03 DIAGNOSIS — Z85828 Personal history of other malignant neoplasm of skin: Secondary | ICD-10-CM | POA: Diagnosis not present

## 2021-09-03 DIAGNOSIS — S99921A Unspecified injury of right foot, initial encounter: Secondary | ICD-10-CM | POA: Diagnosis present

## 2021-09-03 DIAGNOSIS — M7989 Other specified soft tissue disorders: Secondary | ICD-10-CM | POA: Diagnosis not present

## 2021-09-03 DIAGNOSIS — R52 Pain, unspecified: Secondary | ICD-10-CM

## 2021-09-03 MED ORDER — ACETAMINOPHEN 325 MG PO TABS
650.0000 mg | ORAL_TABLET | Freq: Once | ORAL | Status: AC
  Administered 2021-09-04: 650 mg via ORAL
  Filled 2021-09-03: qty 2

## 2021-09-03 NOTE — ED Triage Notes (Signed)
Pt c/o right foot pain after twisting her foot while stepping down today. Denies fall.

## 2021-09-04 DIAGNOSIS — S92354A Nondisplaced fracture of fifth metatarsal bone, right foot, initial encounter for closed fracture: Secondary | ICD-10-CM | POA: Diagnosis not present

## 2021-09-04 NOTE — Discharge Instructions (Addendum)
It was a pleasure taking care of you today. As discussed, you broke a bone in your foot. Keep boot on until evaluated by the orthopedic surgeon. You have been seen by Dr. Ninfa Linden previously, so I have included his number. Call to schedule an appointment.  You may take over-the-counter Tylenol as needed for pain.  Continue to ice and elevate your right foot.  Return to the ER for new or worsening symptoms.

## 2021-09-04 NOTE — Progress Notes (Signed)
Orthopedic Tech Progress Note Patient Details:  Erica Carter [birth year] [id number]  Ortho Devices Type of Ortho Device: CAM walker Ortho Device/Splint Location: RLE Ortho Device/Splint Interventions: Ordered, Application, Adjustment   Post Interventions Patient Tolerated: Well Instructions Provided: Care of device, Poper ambulation with device  Erica Carter A Raygen Dahm 09/04/2021, 9:07 AM

## 2021-09-04 NOTE — ED Notes (Signed)
Ortho notified for to apply CAM boot.

## 2021-09-04 NOTE — ED Provider Notes (Signed)
[facility] EMERGENCY DEPARTMENT Provider Note   CSN: [id number] Arrival date & time: 09/03/21  2016     History Chief Complaint  Patient presents with   Foot Pain    Erica Carter is a 86 y.o. female with a past medical history as noted below who presents to the ED due to right foot pain associated with ecchymosis and edema.  Patient states she was stepping down and twisted her right foot causing immediate pain. Pain is worse with palpation and ambulation. No other injuries. Patient did not fall directly to the ground. No treatment prior to arrival.   History obtained from patient and past medical records. No interpreter used during encounter.       Past Medical History:  Diagnosis Date   Acute gastritis without mention of hemorrhage    Diabetes mellitus without complication (HCC)    diet control, per pt   Diverticulosis of colon (without mention of hemorrhage)    Esophageal dysmotilities    Esophageal reflux    Hiatal hernia    Other and unspecified hyperlipidemia    Personal history of colonic polyps 09/11/2010   hyperplasic   Skin cancer    Stricture and stenosis of esophagus    Ulcerative colitis, unspecified    Unspecified essential hypertension    Wears dentures    upper and lower   Wears glasses     Patient Active Problem List   Diagnosis Date Noted   Hiatal hernia    Gastric polyp    Status post epidural steroid injection 08/04/2017   Unilateral primary osteoarthritis, left hip 06/17/2017   History of gastroesophageal reflux (GERD) 10/26/2011   GASTRITIS, ACUTE W/O HEMORRHAGE 09/11/2010   HYPERLIPIDEMIA 02/12/2008   Essential hypertension 02/12/2008   GERD 02/12/2008   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   HIATAL HERNIA 05/17/2006   COLITIS, ULCERATIVE 05/17/2006   DIVERTICULOSIS, COLON 05/17/2006    Past Surgical History:  Procedure Laterality Date   BRAVO [place] STUDY N/A 09/07/2019   Procedure: BRAVO [place] STUDY;  Surgeon: Lavena Bullion, DO;  Location: [facility];  Service: Gastroenterology;  Laterality: N/A;   BUNIONECTOMY     Bil   CATARACT EXTRACTION Bilateral    ESOPHAGEAL MANOMETRY N/A 08/08/2019   Procedure: ESOPHAGEAL MANOMETRY (EM);  Surgeon: Mauri Pole, MD;  Location: [facility];  Service: Endoscopy;  Laterality: N/A;   ESOPHAGOGASTRODUODENOSCOPY (EGD) WITH PROPOFOL N/A 09/07/2019   Procedure: ESOPHAGOGASTRODUODENOSCOPY (EGD) WITH PROPOFOL;  Surgeon: Lavena Bullion, DO;  Location: [facility];  Service: Gastroenterology;  Laterality: N/A;   HEMOSTASIS CLIP PLACEMENT  09/07/2019   Procedure: HEMOSTASIS CLIP PLACEMENT;  Surgeon: Lavena Bullion, DO;  Location: [facility];  Service: Gastroenterology;;   NASAL SINUS SURGERY     POLYPECTOMY  09/07/2019   Procedure: POLYPECTOMY;  Surgeon: Lavena Bullion, DO;  Location: [facility];  Service: Gastroenterology;;   THUMB ARTHROSCOPY Left    TUBAL LIGATION       OB History   No obstetric history on file.     Family History  Problem Relation Age of Onset   Thyroid cancer Brother        mets to brain   Heart disease Brother    Diabetes Mother    Diabetes Sister    Heart disease Father    Heart disease Sister    Heart disease Brother    Heart disease Brother    Heart disease Brother    Colon cancer Neg Hx  Inflammatory bowel disease Neg Hx    Liver disease Neg Hx    Stomach cancer Neg Hx    Pancreatic cancer Neg Hx     Social History   Tobacco Use   Smoking status: Former   Smokeless tobacco: Never   Tobacco comments:    stopped over 20 years  Vaping Use   Vaping Use: Never used  Substance Use Topics   Alcohol use: No   Drug use: No    Home Medications Prior to Admission medications   Medication Sig Start Date End Date Taking? Authorizing Provider  amLODipine (NORVASC) 5 MG tablet Take 5 mg by mouth daily.    [provider]  aspirin 81 MG tablet Take 81 mg by mouth daily.    [provider]   atorvastatin (LIPITOR) 20 MG tablet Take 20 mg by mouth daily.     [provider]  calcium gluconate 500 MG tablet Take 500 mg by mouth daily.      [provider]  cholecalciferol (VITAMIN D) 1000 UNITS tablet Take 1,000 Units by mouth daily.      [provider]  Cyanocobalamin (VITAMIN B-12 IJ) Inject 1,000 mcg as directed every 30 (thirty) days. Pt gets shot once a month     [provider]  losartan (COZAAR) 100 MG tablet Take 100 mg by mouth daily.      [provider]  mesalamine (APRISO) 0.375 g 24 hr capsule Take 4 capsules (1.5 g total) by mouth daily. 09/23/20   Pyrtle, Lajuan Lines, MD  Misc Natural Products (COLON CARE PO) Take 1 tablet by mouth daily as needed (constipation).     [provider]  Multiple Vitamins-Minerals (CENTRUM SILVER PO) Take 1 tablet by mouth daily.      [provider]  omeprazole (PRILOSEC) 40 MG capsule Take 1 capsule (40 mg total) by mouth in the morning and at bedtime. 09/23/20   Pyrtle, Lajuan Lines, MD  vitamin B-12 (CYANOCOBALAMIN) 500 MCG tablet Take 500 mcg by mouth daily.    [provider]    Allergies    Codeine  Review of Systems   Review of Systems  Musculoskeletal:  Positive for arthralgias, gait problem and joint swelling.  Skin:  Positive for color change.  Neurological:  Negative for numbness.  All other systems reviewed and are negative.  Physical Exam Updated Vital Signs BP (!) 142/74 (BP Location: Left Arm)   Pulse 65   Temp (!) 97.5 F (36.4 C) (Oral)   Resp 16   SpO2 97%   Physical Exam Vitals and nursing note reviewed.  Constitutional:      General: She is not in acute distress.    Appearance: She is not ill-appearing.  HENT:     Head: Normocephalic.  Eyes:     Pupils: Pupils are equal, round, and reactive to light.  Cardiovascular:     Rate and Rhythm: Normal rate and regular rhythm.     Pulses: Normal pulses.     Heart sounds: Normal heart sounds. No  murmur heard.   No friction rub. No gallop.  Pulmonary:     Effort: Pulmonary effort is normal.     Breath sounds: Normal breath sounds.  Abdominal:     General: Abdomen is flat. There is no distension.     Palpations: Abdomen is soft.     Tenderness: There is no abdominal tenderness. There is no guarding or rebound.  Musculoskeletal:  General: Normal range of motion.     Cervical back: Neck supple.     Comments: Tenderness throughout lateral aspect of right foot with surrounding ecchymosis and edema.  Pedal pulses palpable.  Full range of motion of all right toes and ankle.  No tenderness throughout proximal fibula.  Full range of motion of right knee.  Soft compartments.  Skin:    General: Skin is warm and dry.  Neurological:     General: No focal deficit present.     Mental Status: She is alert.  Psychiatric:        Mood and Affect: Mood normal.        Behavior: Behavior normal.    ED Results / Procedures / Treatments   Labs (all labs ordered are listed, but only abnormal results are displayed) Labs Reviewed - No data to display  EKG None  Radiology DG Foot Complete Right  Result Date: 09/03/2021 CLINICAL DATA:  Twisted foot. EXAM: RIGHT FOOT COMPLETE - 3+ VIEW COMPARISON:  None. FINDINGS: There is an acute nondisplaced fracture at the base of the fifth metatarsal. There is no evidence for dislocation. There is a healed fracture of the first metatarsal with small wire in the region of the healed fracture. Joint spaces are maintained. There is soft tissue swelling of the lateral foot. Os navicular noted. IMPRESSION: 1. Nondisplaced acute fracture base of the fifth metatarsal. Electronically Signed   By: Ronney Asters M.D.   On: 09/03/2021 21:19    Procedures Procedures   Medications Ordered in ED Medications  acetaminophen (TYLENOL) tablet 650 mg (650 mg Oral Given 09/04/21 0034)    ED Course  I have reviewed the triage vital signs and the nursing  notes.  Pertinent labs & imaging results that were available during my care of the patient were reviewed by me and considered in my medical decision making (see chart for details).    MDM Rules/Calculators/A&P                          86 year old female presents to the ED due to right foot pain after twisting her foot prior to arrival.  Unfortunately, patient waited over 12 hours prior to initial evaluation due to long wait times. RLE neurovascularly intact with soft compartments. Tenderness throughout lateral aspect of right foot with ecchymosis and edema. Soft compartments. Low suspicion for compartment syndrome. X-ray ordered at triage which I personally reviewed which demonstrates a nondisplaced 5th metatarsal fracture. Patient placed in CAM walker. RICE discussed with patient. Advised patient to follow-up with her orthopedic surgeon for further evaluation. Strict ED precautions discussed with patient. Patient states understanding and agrees to plan. Patient discharged home in no acute distress and stable vitals  Discussed case with Dr. Regenia Skeeter who agrees with assessment and plan.  Final Clinical Impression(s) / ED Diagnoses Final diagnoses:  Pain  Closed nondisplaced fracture of fifth metatarsal bone of right foot, initial encounter    Rx / DC Orders ED Discharge Orders     None        Suzy Bouchard, PA-C 09/04/21 3212    Sherwood Gambler, MD 09/04/21 1531

## 2021-09-07 ENCOUNTER — Ambulatory Visit (HOSPITAL_BASED_OUTPATIENT_CLINIC_OR_DEPARTMENT_OTHER): Payer: Medicare Other | Admitting: Orthopaedic Surgery

## 2021-09-14 DIAGNOSIS — E538 Deficiency of other specified B group vitamins: Secondary | ICD-10-CM | POA: Diagnosis not present

## 2021-09-16 ENCOUNTER — Other Ambulatory Visit: Payer: Self-pay | Admitting: Internal Medicine

## 2021-09-16 DIAGNOSIS — D699 Hemorrhagic condition, unspecified: Secondary | ICD-10-CM | POA: Diagnosis not present

## 2021-09-17 ENCOUNTER — Other Ambulatory Visit: Payer: Self-pay | Admitting: Internal Medicine

## 2021-09-22 ENCOUNTER — Other Ambulatory Visit: Payer: Self-pay | Admitting: Internal Medicine

## 2021-09-24 ENCOUNTER — Other Ambulatory Visit: Payer: Self-pay | Admitting: Internal Medicine

## 2021-10-08 DIAGNOSIS — M79671 Pain in right foot: Secondary | ICD-10-CM | POA: Diagnosis not present

## 2021-10-08 DIAGNOSIS — S92354A Nondisplaced fracture of fifth metatarsal bone, right foot, initial encounter for closed fracture: Secondary | ICD-10-CM | POA: Diagnosis not present

## 2021-10-12 DIAGNOSIS — E538 Deficiency of other specified B group vitamins: Secondary | ICD-10-CM | POA: Diagnosis not present

## 2021-10-13 ENCOUNTER — Other Ambulatory Visit: Payer: Self-pay

## 2021-10-13 ENCOUNTER — Telehealth: Payer: Self-pay | Admitting: Internal Medicine

## 2021-10-13 MED ORDER — MESALAMINE ER 0.375 G PO CP24: 1.5000 g | ORAL_CAPSULE | Freq: Every day | ORAL | 0 refills | Status: DC

## 2021-10-13 NOTE — Telephone Encounter (Signed)
Patient called and stated that she needs her Prilosec and Apriso refilled, please advise.

## 2021-10-13 NOTE — Telephone Encounter (Signed)
One month of Apriso sent to pharmacy.  Patient needs an appointment for any further refills. Last seen 09-2020

## 2021-10-13 NOTE — Progress Notes (Signed)
One month of Apriso sent to pharmacy.  Patient needs an appointment for any further refills. Last seen 09-2020

## 2021-10-14 ENCOUNTER — Other Ambulatory Visit: Payer: Self-pay | Admitting: Internal Medicine

## 2021-11-12 DIAGNOSIS — E538 Deficiency of other specified B group vitamins: Secondary | ICD-10-CM | POA: Diagnosis not present

## 2021-11-13 ENCOUNTER — Ambulatory Visit: Payer: Medicare Other | Admitting: Internal Medicine

## 2021-11-13 ENCOUNTER — Encounter: Payer: Self-pay | Admitting: Internal Medicine

## 2021-11-13 VITALS — BP 124/62 | HR 84 | Ht 60.0 in | Wt 118.0 lb

## 2021-11-13 DIAGNOSIS — K219 Gastro-esophageal reflux disease without esophagitis: Secondary | ICD-10-CM

## 2021-11-13 DIAGNOSIS — K5190 Ulcerative colitis, unspecified, without complications: Secondary | ICD-10-CM

## 2021-11-13 DIAGNOSIS — R1110 Vomiting, unspecified: Secondary | ICD-10-CM | POA: Diagnosis not present

## 2021-11-13 DIAGNOSIS — K5909 Other constipation: Secondary | ICD-10-CM | POA: Diagnosis not present

## 2021-11-13 DIAGNOSIS — E538 Deficiency of other specified B group vitamins: Secondary | ICD-10-CM

## 2021-11-13 MED ORDER — PANTOPRAZOLE SODIUM 40 MG PO TBEC: 40.0000 mg | DELAYED_RELEASE_TABLET | Freq: Every day | ORAL | 1 refills | Status: DC

## 2021-11-13 MED ORDER — MESALAMINE ER 0.375 G PO CP24: 1.5000 g | ORAL_CAPSULE | Freq: Every day | ORAL | 3 refills | Status: DC

## 2021-11-13 MED ORDER — PANTOPRAZOLE SODIUM 40 MG PO TBEC: 40.0000 mg | DELAYED_RELEASE_TABLET | Freq: Two times a day (BID) | ORAL | 1 refills | Status: DC

## 2021-11-13 NOTE — Patient Instructions (Addendum)
We have sent the following medications to your pharmacy for you to pick up at your convenience: Pantoprazole 40 mg twice daily  Continue B12 intramuscularly. You do not need to take the oral B12.  Please follow up with Dr Hilarie Fredrickson in 1 year.  If you are age 85 or older, your body mass index should be between 23-30. Your Body mass index is 23.05 kg/m. If this is out of the aforementioned range listed, please consider follow up with your Primary Care Provider.  If you are age 32 or younger, your body mass index should be between 19-25. Your Body mass index is 23.05 kg/m. If this is out of the aformentioned range listed, please consider follow up with your Primary Care Provider.   ________________________________________________________  The [facility] providers would like to encourage you to use [facility] to communicate with providers for non-urgent requests or questions.  Due to long hold times on the telephone, sending your provider a message by [facility] may be a faster and more efficient way to get a response.  Please allow 48 business hours for a response.  Please remember that this is for non-urgent requests.  _______________________________________________________  Due to recent changes in healthcare laws, you may see the results of your imaging and laboratory studies on MyChart before your provider has had a chance to review them.  We understand that in some cases there may be results that are confusing or concerning to you. Not all laboratory results come back in the same time frame and the provider may be waiting for multiple results in order to interpret others.  Please give [place] 48 hours in order for your provider to thoroughly review all the results before contacting the office for clarification of your results.

## 2021-11-13 NOTE — Progress Notes (Signed)
° °  Subjective:    Patient ID: Erica Carter, female    DOB: 07-20-1935, 87 y.o.   MRN: [id number]  HPI Erica Carter is an 87 year old female with a history of ulcerative colitis, colonic polyps, GERD with hiatal hernia, chronic constipation who is here for follow-up.  She is here today with her daughter and was last seen in November 2021.  She reports that she does not feel the omeprazole is helping much because she has frequent regurgitation.  This is worse when she lies down, but can happen even while she is sitting.  She is not having heartburn or pyrosis but rather just regurgitation.  Her daughter notes that she eats well but her meals are typically fairly small.  She denies early satiety.  No nausea or vomiting.  No abdominal pain.  No dysphagia or odynophagia.  She is using omeprazole 40 mg twice daily.  From a colitis perspective she remains on Apriso 1.5 g daily.  She uses an over-the-counter "colon care" product containing senna which helps keep her bowel movements regular typically once per day.  She has not seen blood in stool or melena.   Review of Systems As per HPI, otherwise negative  Current Medications, Allergies, Past Medical History, Past Surgical History, Family History and Social History were reviewed in [employer] record.    Objective:   Physical Exam BP 124/62    Pulse 84    Ht 5' (1.524 m)    Wt 118 lb (53.5 kg)    BMI 23.05 kg/m  Gen: awake, alert, NAD HEENT: anicteric  CV: RRR, no mrg Pulm: CTA b/l Abd: soft, NT/ND, +BS throughout Ext: no c/c/e Neuro: nonfocal     Assessment & Plan:  87 year old female with a history of ulcerative colitis, colonic polyps, GERD with hiatal hernia, chronic constipation who is here for follow-up.   GERD/regurgitation --I feel that her symptoms are likely regurgitation of nonacid stomach contents.  We discussed how PPIs stop acid reflux but not necessarily nonacid reflux.  I am going to try her on a  different PPI to see if this symptom is better controlled.  We discussed Nissen fundoplication but at her age I do not feel that this necessarily makes sense.  She is in agreement and is not wanting surgery. --Change omeprazole to pantoprazole 40 mg daily --Antireflux precautions  2.  UC --in clinical remission on Apriso, surveillance colonoscopy discontinued due to age which she is in agreement with. --Continue Apriso 1.5 g daily  3.  Chronic constipation --continue over-the-counter senna product which is working well for her  4.  B12 deficiency --continue monthly IM B12.  I told her that she likely would not benefit from oral B12 given that she is getting parenteral supplementation.  Annual follow-up, sooner if needed

## 2021-11-19 DIAGNOSIS — S92354A Nondisplaced fracture of fifth metatarsal bone, right foot, initial encounter for closed fracture: Secondary | ICD-10-CM | POA: Diagnosis not present

## 2021-11-19 DIAGNOSIS — M79671 Pain in right foot: Secondary | ICD-10-CM | POA: Diagnosis not present

## 2021-12-10 DIAGNOSIS — E538 Deficiency of other specified B group vitamins: Secondary | ICD-10-CM | POA: Diagnosis not present

## 2022-01-07 DIAGNOSIS — E538 Deficiency of other specified B group vitamins: Secondary | ICD-10-CM | POA: Diagnosis not present

## 2022-02-04 DIAGNOSIS — E538 Deficiency of other specified B group vitamins: Secondary | ICD-10-CM | POA: Diagnosis not present

## 2022-02-26 DIAGNOSIS — N183 Chronic kidney disease, stage 3 unspecified: Secondary | ICD-10-CM | POA: Diagnosis not present

## 2022-02-26 DIAGNOSIS — D692 Other nonthrombocytopenic purpura: Secondary | ICD-10-CM | POA: Diagnosis not present

## 2022-02-26 DIAGNOSIS — E538 Deficiency of other specified B group vitamins: Secondary | ICD-10-CM | POA: Diagnosis not present

## 2022-02-26 DIAGNOSIS — E1122 Type 2 diabetes mellitus with diabetic chronic kidney disease: Secondary | ICD-10-CM | POA: Diagnosis not present

## 2022-02-26 DIAGNOSIS — I129 Hypertensive chronic kidney disease with stage 1 through stage 4 chronic kidney disease, or unspecified chronic kidney disease: Secondary | ICD-10-CM | POA: Diagnosis not present

## 2022-03-05 ENCOUNTER — Other Ambulatory Visit: Payer: Self-pay | Admitting: Internal Medicine

## 2022-03-25 DIAGNOSIS — R413 Other amnesia: Secondary | ICD-10-CM | POA: Diagnosis not present

## 2022-04-22 DIAGNOSIS — E538 Deficiency of other specified B group vitamins: Secondary | ICD-10-CM | POA: Diagnosis not present

## 2022-05-15 ENCOUNTER — Other Ambulatory Visit: Payer: Self-pay | Admitting: Internal Medicine

## 2022-05-17 ENCOUNTER — Telehealth: Payer: Self-pay | Admitting: Internal Medicine

## 2022-05-17 NOTE — Telephone Encounter (Signed)
Inbound call from patient stating she needs a refill for Protonix. Please advise.

## 2022-05-17 NOTE — Telephone Encounter (Signed)
Protonix refilled as requested. I was unable to leave her voicemail so I called and left a detailed message for her daughter Lenna Sciara that the refill has been sent in.

## 2022-05-20 DIAGNOSIS — E538 Deficiency of other specified B group vitamins: Secondary | ICD-10-CM | POA: Diagnosis not present

## 2022-05-27 ENCOUNTER — Emergency Department (HOSPITAL_COMMUNITY): Payer: Medicare Other

## 2022-05-27 ENCOUNTER — Emergency Department (HOSPITAL_COMMUNITY)
Admission: EM | Admit: 2022-05-27 | Discharge: 2022-05-28 | Disposition: A | Discharge status: Home / Self Care | Payer: Medicare Other | Attending: Emergency Medicine | Admitting: Emergency Medicine

## 2022-05-27 DIAGNOSIS — Z7982 Long term (current) use of aspirin: Secondary | ICD-10-CM | POA: Diagnosis not present

## 2022-05-27 DIAGNOSIS — M79672 Pain in left foot: Secondary | ICD-10-CM | POA: Diagnosis not present

## 2022-05-27 DIAGNOSIS — M79675 Pain in left toe(s): Secondary | ICD-10-CM | POA: Diagnosis not present

## 2022-05-27 DIAGNOSIS — M7989 Other specified soft tissue disorders: Secondary | ICD-10-CM | POA: Diagnosis not present

## 2022-05-27 LAB — CBC WITH DIFFERENTIAL/PLATELET
Abs Immature Granulocytes: 0.03 10*3/uL (ref 0.00–0.07)
Basophils Absolute: 0 10*3/uL (ref 0.0–0.1)
Basophils Relative: 0 %
Eosinophils Absolute: 0.2 10*3/uL (ref 0.0–0.5)
Eosinophils Relative: 2 %
HCT: 44.8 % (ref 36.0–46.0)
Hemoglobin: 14.5 g/dL (ref 12.0–15.0)
Immature Granulocytes: 0 %
Lymphocytes Relative: 22 %
Lymphs Abs: 2 10*3/uL (ref 0.7–4.0)
MCH: 28.9 pg (ref 26.0–34.0)
MCHC: 32.4 g/dL (ref 30.0–36.0)
MCV: 89.4 fL (ref 80.0–100.0)
Monocytes Absolute: 1 10*3/uL (ref 0.1–1.0)
Monocytes Relative: 11 %
Neutro Abs: 5.8 10*3/uL (ref 1.7–7.7)
Neutrophils Relative %: 65 %
Platelets: 355 10*3/uL (ref 150–400)
RBC: 5.01 MIL/uL (ref 3.87–5.11)
RDW: 13.8 % (ref 11.5–15.5)
WBC: 9.1 10*3/uL (ref 4.0–10.5)
nRBC: 0 % (ref 0.0–0.2)

## 2022-05-27 LAB — BASIC METABOLIC PANEL
Anion gap: 9 (ref 5–15)
BUN: 7 mg/dL — ABNORMAL LOW (ref 8–23)
CO2: 28 mmol/L (ref 22–32)
Calcium: 9.3 mg/dL (ref 8.9–10.3)
Chloride: 105 mmol/L (ref 98–111)
Creatinine, Ser: 0.77 mg/dL (ref 0.44–1.00)
GFR, Estimated: >60 mL/min (ref >60)
Glucose, Bld: 119 mg/dL — ABNORMAL HIGH (ref 70–99)
Potassium: 4.9 mmol/L (ref 3.5–5.1)
Sodium: 142 mmol/L (ref 135–145)

## 2022-05-27 LAB — C-REACTIVE PROTEIN: CRP: 1.4 mg/dL — ABNORMAL HIGH (ref <1.0)

## 2022-05-27 LAB — SEDIMENTATION RATE: Sed Rate: 5 mm/hr (ref 0–22)

## 2022-05-27 NOTE — ED Triage Notes (Signed)
Pt c/o "sore L foot" since yesterday morning. Denies injury to foot, no meds for pain. Swelling & redness noted to L great toe, able to bear weight.

## 2022-05-27 NOTE — ED Provider Triage Note (Signed)
Emergency Medicine Provider Triage Evaluation Note  ANABELLE BUNGERT , a 87 y.o. female  was evaluated in triage.  Pt complains of pain over the left great toe that started yesterday.  Today patient noted worsening swelling and erythema.  Denies prior history of gout.  No trauma.  Review of Systems  Positive: As above Negative: As above  Physical Exam  BP (!) 143/72   Pulse 73   Resp 18   SpO2 97%  Gen:   Awake, no distress   Resp:  Normal effort  MSK:   Moves extremities without difficulty  Other:  Erythematous, swelling, and tenderness noted to left great toe.  Medical Decision Making  Medically screening exam initiated at 7:05 PM.  Appropriate orders placed.  CELICA KOTOWSKI was informed that the remainder of the evaluation will be completed by another provider, this initial triage assessment does not replace that evaluation, and the importance of remaining in the ED until their evaluation is complete.    Evlyn Courier, PA-C 05/27/22 1906

## 2022-05-28 MED ORDER — CEPHALEXIN 500 MG PO CAPS: 500.0000 mg | ORAL_CAPSULE | Freq: Four times a day (QID) | ORAL | 0 refills | Status: DC

## 2022-05-28 MED ORDER — CEPHALEXIN 250 MG PO CAPS
1000.0000 mg | ORAL_CAPSULE | Freq: Once | ORAL | Status: AC
  Administered 2022-05-28: 1000 mg via ORAL
  Filled 2022-05-28: qty 4

## 2022-05-28 MED ORDER — PREDNISONE 20 MG PO TABS: ORAL_TABLET | ORAL | 0 refills | Status: DC

## 2022-05-28 MED ORDER — OXYCODONE-ACETAMINOPHEN 5-325 MG PO TABS
1.0000 | ORAL_TABLET | Freq: Once | ORAL | Status: AC
  Administered 2022-05-28: 1 via ORAL
  Filled 2022-05-28: qty 1

## 2022-05-28 MED ORDER — PREDNISONE 20 MG PO TABS
60.0000 mg | ORAL_TABLET | Freq: Once | ORAL | Status: AC
  Administered 2022-05-28: 60 mg via ORAL
  Filled 2022-05-28: qty 3

## 2022-05-28 NOTE — ED Notes (Signed)
Patient verbalizes understanding of d/c instructions. Opportunities for questions and answers were provided. Pt d/c from ED and wheeled to lobby with daughter.

## 2022-05-28 NOTE — ED Provider Notes (Signed)
[place] EMERGENCY DEPARTMENT Provider Note   CSN: [id number] Arrival date & time: 05/27/22  1746     History  No chief complaint on file.   Erica Carter is a 87 y.o. female.  87 year old female who presents the ER today secondary to foot pain.  Patient states is been going on for couple days of progressively worsening.  She states that she had no injury to the foot.  She is no fevers.  Has not rapidly spread.  No history of this.  No history of gout.  Is on mesalamine for ulcerative colitis and on Protonix for indigestion.  She sees GI for these.        Home Medications Prior to Admission medications   Medication Sig Start Date End Date Taking? Authorizing Provider  cephALEXin (KEFLEX) 500 MG capsule Take 1 capsule (500 mg total) by mouth 4 (four) times daily. 05/28/22  Yes Carlisia Geno, Corene Cornea, MD  predniSONE (DELTASONE) 20 MG tablet 3 tabs po daily x 3 days, then 2 tabs x 3 days, then 1.5 tabs x 3 days, then 1 tab x 3 days, then 0.5 tabs x 3 days 05/28/22  Yes Rahiem Schellinger, Corene Cornea, MD  amLODipine (NORVASC) 5 MG tablet Take 5 mg by mouth daily.    [provider]  aspirin 81 MG tablet Take 81 mg by mouth daily.    [provider]  atorvastatin (LIPITOR) 20 MG tablet Take 20 mg by mouth daily.     [provider]  calcium gluconate 500 MG tablet Take 500 mg by mouth daily.      [provider]  cholecalciferol (VITAMIN D) 1000 UNITS tablet Take 1,000 Units by mouth daily.      [provider]  Cyanocobalamin (VITAMIN B-12 IJ) Inject 1,000 mcg as directed every 30 (thirty) days. Pt gets shot once a month     [provider]  losartan (COZAAR) 100 MG tablet Take 100 mg by mouth daily.      [provider]  mesalamine (APRISO) 0.375 g 24 hr capsule Take 4 tablets by mouth once daily 03/05/22   Pyrtle, Lajuan Lines, MD  Misc Natural Products (COLON CARE PO) Take 1 tablet by mouth daily as needed (constipation).      [provider]  Multiple Vitamins-Minerals (CENTRUM SILVER PO) Take 1 tablet by mouth daily.      [provider]  pantoprazole (PROTONIX) 40 MG tablet TAKE 1 TABLET BY MOUTH TWICE DAILY BEFORE A MEAL 05/17/22   Pyrtle, Lajuan Lines, MD  vitamin B-12 (CYANOCOBALAMIN) 500 MCG tablet Take 500 mcg by mouth daily.    [provider]      Allergies    Codeine    Review of Systems   Review of Systems  Physical Exam Updated Vital Signs BP (!) 170/84   Pulse 71   Temp 97.7 F (36.5 C) (Oral)   Resp 18   SpO2 97%  Physical Exam Vitals and nursing note reviewed.  Constitutional:      Appearance: She is well-developed.  HENT:     Head: Normocephalic and atraumatic.     Mouth/Throat:     Mouth: Mucous membranes are moist.     Pharynx: Oropharynx is clear.  Cardiovascular:     Rate and Rhythm: Normal rate and regular rhythm.  Pulmonary:     Effort: No respiratory distress.     Breath sounds: No stridor.  Abdominal:     General: There is no distension.  Musculoskeletal:        General: No swelling.     Cervical back: Normal range of motion.  Skin:    Findings: Erythema (Around the first MCP of left foot.  Tender to touch.  Pain with active range of motion but not passive.  No fluctuance.  Mildly indurated and warm) present.  Neurological:     Mental Status: She is alert.     ED Results / Procedures / Treatments   Labs (all labs ordered are listed, but only abnormal results are displayed) Labs Reviewed  BASIC METABOLIC PANEL - Abnormal; Notable for the following components:      Result Value   Glucose, Bld 119 (*)    BUN 7 (*)    All other components within normal limits  C-REACTIVE PROTEIN - Abnormal; Notable for the following components:   CRP 1.4 (*)    All other components within normal limits  CBC WITH DIFFERENTIAL/PLATELET  SEDIMENTATION RATE    EKG None  Radiology DG Foot Complete Left  Result Date: 05/27/2022 CLINICAL DATA:  Left great  toe pain. Swelling. No known injury. Prior bunion surgery. EXAM: LEFT FOOT - COMPLETE 3+ VIEW COMPARISON:  Radiograph 10/06/2019 FINDINGS: Stable postsurgical change of the first metatarsal. There is moderate first metatarsal phalangeal joint osteoarthritis. No fracture. No erosion or bone destruction. Midfoot degenerative change with dorsal spurring. Os navicular. There is mild soft tissue thickening medial to the first metatarsal phalangeal joint. No soft tissue calcifications. IMPRESSION: 1. Moderate first metatarsophalangeal joint osteoarthritis. 2. Mild soft tissue thickening medial to the first metatarsophalangeal joint is nonspecific, but may represent gout. 3. Midfoot degenerative change. 4. Stable postsurgical change of the first metatarsal. Electronically Signed   By: Keith Rake M.D.   On: 05/27/2022 19:51    Procedures Procedures    Medications Ordered in ED Medications  predniSONE (DELTASONE) tablet 60 mg (60 mg Oral Given 05/28/22 0530)  oxyCODONE-acetaminophen (PERCOCET/ROXICET) 5-325 MG per tablet 1 tablet (1 tablet Oral Given 05/28/22 0531)  cephALEXin (KEFLEX) capsule 1,000 mg (1,000 mg Oral Given 05/28/22 0531)    ED Course/ Medical Decision Making/ A&P                           Medical Decision Making Risk Prescription drug management.   Patient with gout versus cellulitis.  Does not have an elevated white count, ESR is normal CRP is minimally elevated x-ray shows osteoarthritis with just skin changes.  Patient certainly does not meet the normal demographic or clinical picture of gout however the area is not rapidly progressing no leukocytosis to suggest a septic joint.  Could be cellulitis as well however felt to be a little bit less likely.  We will treat for both with PCP follow-up.  She is already on mesalamine so I hesitate to do any other anti-inflammatories we will just do prednisone and Tylenol as needed at home. Recommended PCP follow up in about 3-4 days to ensure  improvement. Here if things worsen or new symptoms. Pain controlled on discharge.   Final Clinical Impression(s) / ED Diagnoses Final diagnoses:  Left foot pain    Rx / DC Orders ED Discharge Orders          Ordered    cephALEXin (KEFLEX) 500 MG capsule  4 times daily        05/28/22 0616    predniSONE (DELTASONE) 20 MG tablet        05/28/22 2330  Mia Winthrop, Corene Cornea, MD 05/28/22 [phone]

## 2022-06-17 DIAGNOSIS — E538 Deficiency of other specified B group vitamins: Secondary | ICD-10-CM | POA: Diagnosis not present

## 2022-07-15 DIAGNOSIS — E538 Deficiency of other specified B group vitamins: Secondary | ICD-10-CM | POA: Diagnosis not present

## 2022-08-09 ENCOUNTER — Other Ambulatory Visit: Payer: Self-pay | Admitting: Internal Medicine

## 2022-08-19 ENCOUNTER — Other Ambulatory Visit: Payer: Self-pay | Admitting: Internal Medicine

## 2022-08-23 DIAGNOSIS — E782 Mixed hyperlipidemia: Secondary | ICD-10-CM | POA: Diagnosis not present

## 2022-08-23 DIAGNOSIS — D519 Vitamin B12 deficiency anemia, unspecified: Secondary | ICD-10-CM | POA: Diagnosis not present

## 2022-08-23 DIAGNOSIS — I129 Hypertensive chronic kidney disease with stage 1 through stage 4 chronic kidney disease, or unspecified chronic kidney disease: Secondary | ICD-10-CM | POA: Diagnosis not present

## 2022-08-23 DIAGNOSIS — N183 Chronic kidney disease, stage 3 unspecified: Secondary | ICD-10-CM | POA: Diagnosis not present

## 2022-08-23 DIAGNOSIS — G4700 Insomnia, unspecified: Secondary | ICD-10-CM | POA: Diagnosis not present

## 2022-08-23 DIAGNOSIS — R413 Other amnesia: Secondary | ICD-10-CM | POA: Diagnosis not present

## 2022-08-23 DIAGNOSIS — E1122 Type 2 diabetes mellitus with diabetic chronic kidney disease: Secondary | ICD-10-CM | POA: Diagnosis not present

## 2022-08-23 DIAGNOSIS — Z0000 Encounter for general adult medical examination without abnormal findings: Secondary | ICD-10-CM | POA: Diagnosis not present

## 2022-08-23 DIAGNOSIS — Z23 Encounter for immunization: Secondary | ICD-10-CM | POA: Diagnosis not present

## 2022-08-23 DIAGNOSIS — K5190 Ulcerative colitis, unspecified, without complications: Secondary | ICD-10-CM | POA: Diagnosis not present

## 2022-08-26 ENCOUNTER — Encounter: Payer: Self-pay | Admitting: Physician Assistant

## 2022-08-26 ENCOUNTER — Ambulatory Visit: Payer: Medicare Other | Admitting: Physician Assistant

## 2022-08-26 VITALS — BP 123/75 | HR 84 | Resp 18 | Wt 116.0 lb

## 2022-08-26 DIAGNOSIS — R413 Other amnesia: Secondary | ICD-10-CM

## 2022-08-26 DIAGNOSIS — F0280 Dementia in other diseases classified elsewhere without behavioral disturbance: Secondary | ICD-10-CM

## 2022-08-26 DIAGNOSIS — G309 Alzheimer's disease, unspecified: Secondary | ICD-10-CM | POA: Diagnosis not present

## 2022-08-26 MED ORDER — MEMANTINE HCL 10 MG PO TABS: ORAL_TABLET | ORAL | 11 refills | Status: DC

## 2022-08-26 NOTE — Progress Notes (Signed)
Assessment/Plan:    The patient is seen in neurologic consultation at the request of Erica Neer, MD for the evaluation of memory.  Erica Carter is a very pleasant 87 y.o. year old RH female with  a history of ulcerative colitis, GERD, B12 deficiency, DM2, CKD 3, osteoarthritis, hypertension, hyperlipidemia, depression seen today for evaluation of memory loss. MoCA today is 15 /30 .  Work-up is in progress.  Findings are suspicious for dementia due to Alzheimer disease  Dementia due to Alzheimer's Disease, late onset  MRI brain without contrast to assess for underlying structural abnormality and assess vascular load  Start Memantine 10 mg: Take 1 tablet (10 mg at night) for 2 weeks, then increase to 1 tablet (10 mg) twice a day. Side effects discussed Folllow up  3 months  Continue to control mood as per PCP Recommend good control of cardiovascular risk factors. Continue trazodone for sleep   Carefully monitor driving.   Subjective:    The patient is accompanied by her daughter who supplements the history.    How long did patient have memory difficulties?  Patient reports that about 1 year ago, she began the names of her children, grandchildren, and great-grandchildren.  She began to write at least "otherwise I will forget ".  She reports that her long-term memory is fairly good. repeats oneself? endorsed Disoriented when walking into a room?  Patient denies but her daughter reports that she wonders many times why she came to the room for.  Leaving objects in unusual places?  Patient denies leaving any unusual places, but she does misplace objects, rather small objects.  Patient lives her daughter lives with her 3 days a week, because she lives out of town.  Otherwise, she has another great granddaughter who stays with her when not at school.   Ambulates  with difficulty?   Patient denies   Recent falls?  Patient denies   Any head injuries?  Patient denies   History of  seizures?   Patient denies   Wandering behavior?  Patient denies   Patient drives?   Daughter got the visit WhatsApp application "182 "in case that she gets lost.  There were a couple of episodes in which she may have a felt disoriented during driving. Any mood changes ?  denies  Any depression? Denies  Hallucinations?  Patient denies   Paranoia?  Patient denies   Patient reports that she does not  sleep  well  over the last 2 years, without vivid dreams, with REM behavior (shadow boxing), denies sleepwalking   .  He just began taking trazodone. History of sleep apnea?  Patient denies   Any hygiene concerns?  Patient denies   Independent of bathing and dressing?  Endorsed  Does the patient needs help with medications?  Patient is in charge Who is in charge of the finances? Patient is in charge, although her daughter reports that she has fallen victim of a scam recently and she is about to start taking charge of the finances. Any changes in appetite?  Patient denies  Patient have trouble swallowing? Patient denies   Does the patient cook?  Not much  Any kitchen accidents such as leaving the stove on? Patient denies   Any headaches?  Patient denies   Double vision? Patient denies   Any focal numbness or tingling?  Patient denies   Chronic back pain Patient denies   Unilateral weakness?  Patient denies   Any tremors?  Patient denies  Any history of anosmia? For years, at least  since the sinus surgery 10 years ago Any incontinence of urine? Endorsed, for the last 2 year she has stress incontinence,  uses pads  Any bowel dysfunction?  She has a history of ulcerative colitis with diarrhea History of heavy alcohol intake?  Patient denies   History of heavy tobacco use?  Patient denies   Family history of dementia?   Denies    Pertinent labs October 2023 A1c 6.1, TSH 3.07 lipid panel is normal  Allergies  Allergen Reactions   Codeine     REACTION: Mouth swells Other reaction(s): Other  (See Comments) Unknown rxn    Current Outpatient Medications  Medication Instructions   amLODipine (NORVASC) 5 mg, Oral, Daily   aspirin 81 mg, Oral, Daily   atorvastatin (LIPITOR) 20 mg, Oral, Daily   calcium gluconate 500 mg, Oral, Daily   cephALEXin (KEFLEX) 500 mg, Oral, 4 times daily   cholecalciferol (VITAMIN D) 1,000 Units, Oral, Daily   Cyanocobalamin (VITAMIN B-12 IJ) 1,000 mcg, Injection, Every 30 days, Pt gets shot once a month   cyanocobalamin (VITAMIN B12) 500 mcg, Oral, Daily   losartan (COZAAR) 100 mg, Oral, Daily   memantine (NAMENDA) 10 MG tablet Take 1 tablet (10 mg at night) for 2 weeks, then increase to 1 tablet (10 mg) twice a day   mesalamine (APRISO) 0.375 g 24 hr capsule TAKE 4  BY MOUTH ONCE DAILY   Misc Natural Products (COLON CARE PO) 1 tablet, Oral, Daily PRN   Multiple Vitamins-Minerals (CENTRUM SILVER PO) 1 tablet, Oral, Daily   pantoprazole (PROTONIX) 40 MG tablet TAKE 1 TABLET BY MOUTH TWICE DAILY BEFORE A MEAL   predniSONE (DELTASONE) 20 MG tablet 3 tabs po daily x 3 days, then 2 tabs x 3 days, then 1.5 tabs x 3 days, then 1 tab x 3 days, then 0.5 tabs x 3 days     VITALS:   Vitals:   08/26/22 0950  BP: 123/75  Pulse: 84  Resp: 18  SpO2: 96%  Weight: 116 lb (52.6 kg)       No data to display          PHYSICAL EXAM   HEENT:  Normocephalic, atraumatic. The mucous membranes are moist. The superficial temporal arteries are without ropiness or tenderness. Cardiovascular: Regular rate and rhythm. Lungs: Clear to auscultation bilaterally. Neck: There are no carotid bruits noted bilaterally.  NEUROLOGICAL:    08/26/2022   12:00 PM  Montreal Cognitive Assessment   Visuospatial/ Executive (0/5) 2  Naming (0/3) 0  Attention: Read list of digits (0/2) 2  Attention: Read list of letters (0/1) 1  Attention: Serial 7 subtraction starting at 100 (0/3) 3  Language: Repeat phrase (0/2) 1  Language : Fluency (0/1) 0  Abstraction (0/2) 0   Delayed Recall (0/5) 0  Orientation (0/6) 6  Total 15        No data to display           Orientation:  Alert and oriented to person, place and time. No aphasia or dysarthria. Fund of knowledge is reduced. Recent and remote memory impaired.  Attention and concentration are normal.  Unable to name objects and repeat phrases. Delayed recall 0/5 Cranial nerves: There is good facial symmetry. Extraocular muscles are intact and visual fields are full to confrontational testing. Speech is fluent and clear. Soft palate rises symmetrically and there is no tongue deviation. Hearing is intact to conversational tone. Tone: Tone  is good throughout. Sensation: Sensation is intact to light touch and pinprick throughout. Vibration is intact at the bilateral big toe.There is no extinction with double simultaneous stimulation. There is no sensory dermatomal level identified. Coordination: The patient has no difficulty with RAM's or FNF bilaterally. Normal finger to nose  Motor: Strength is 5/5 in the bilateral upper and lower extremities. There is no pronator drift. There are no fasciculations noted. DTR's: Deep tendon reflexes are 2/4 at the bilateral biceps, triceps, brachioradialis, patella and achilles.  Plantar responses are downgoing bilaterally. Gait and Station: The patient is able to ambulate without difficulty.The patient is able to heel toe walk without any difficulty, able to ambulate in a tandem fashion, able to stand in the Romberg position.     Thank you for allowing [place] the opportunity to participate in the care of this nice patient. Please do not hesitate to contact us for any questions or concerns.   Total time spent on today's visit was 63 minutes dedicated to this patient today, preparing to see patient, examining the patient, ordering tests and/or medications and counseling the patient, documenting clinical information in the EHR or other health record, independently interpreting results  and communicating results to the patient/family, discussing treatment and goals, answering patient's questions and coordinating care.  Cc:  Erica Neer, MD  Sharene Butters 08/26/2022 12:32 PM

## 2022-08-26 NOTE — Patient Instructions (Addendum)
It was a pleasure to see you today at our office.   Recommendations:  Follow up in  3 months Start  Memantine 10 mg, 1 tablet at night for 2 weeks, then increase to one tab  twice daily.Side effects were discussed  MRI brain   Whom to call:  Memory  decline, memory medications: Call our office [phone]   For psychiatric meds, mood meds: Please have your primary care physician manage these medications.   Counseling regarding caregiver distress, including caregiver depression, anxiety and issues regarding community resources, adult day care programs, adult living facilities, or memory care questions:   Feel free to contact Elmo, Social Worker at [phone]   For assessment of decision of mental capacity and competency:  Call Dr. Anthoney Harada, geriatric psychiatrist at [phone]  For guidance in geriatric dementia issues please call [facility] [phone]  For guidance regarding [facility] and if placement were needed at the facility, contact Arnell Asal, Social Worker tel: [phone]  If you have any severe symptoms of a stroke, or other severe issues such as confusion,severe chills or fever, etc call 911 or go to the ER as you may need to be evaluated further   Feel free to visit Facebook page " [email or web address]" for tips of how to care for people with memory problems.       RECOMMENDATIONS FOR ALL PATIENTS WITH MEMORY PROBLEMS: 1. Continue to exercise (Recommend 30 minutes of walking everyday, or 3 hours every week) 2. Increase social interactions - continue going to [place] and enjoy social gatherings with friends and family 3. Eat healthy, avoid fried foods and eat more fruits and vegetables 4. Maintain adequate blood pressure, blood sugar, and blood cholesterol level. Reducing the risk of stroke and cardiovascular disease also helps promoting better memory. 5. Avoid stressful situations. Live a simple life and avoid  aggravations. Organize your time and prepare for the next day in anticipation. 6. Sleep well, avoid any interruptions of sleep and avoid any distractions in the bedroom that may interfere with adequate sleep quality 7. Avoid sugar, avoid sweets as there is a strong link between excessive sugar intake, diabetes, and cognitive impairment We discussed the Mediterranean diet, which has been shown to help patients reduce the risk of progressive memory disorders and reduces cardiovascular risk. This includes eating fish, eat fruits and green leafy vegetables, nuts like almonds and hazelnuts, walnuts, and also use olive oil. Avoid fast foods and fried foods as much as possible. Avoid sweets and sugar as sugar use has been linked to worsening of memory function.  There is always a concern of gradual progression of memory problems. If this is the case, then we may need to adjust level of care according to patient needs. Support, both to the patient and caregiver, should then be put into place.    The Alzheimer[employer] Association is here all day, every day for people facing Alzheimer's disease through our free 24/7 Helpline: [phone]. The Helpline provides reliable information and support to all those who need assistance, such as individuals living with memory loss, Alzheimer's or other dementia, caregivers, health care professionals and the public.  Our highly trained and knowledgeable staff can help you with: Understanding memory loss, dementia and Alzheimer's  Medications and other treatment options  General information about aging and brain health  Skills to provide quality care and to find the best care from professionals  Legal, financial and living-arrangement decisions Our Helpline also features: Confidential care consultation  provided by master's level clinicians who can help with decision-making support, crisis assistance and education on issues families face every day  Help in a caller's preferred  language using our translation service that features more than 200 languages and dialects  Referrals to local community programs, services and ongoing support     FALL PRECAUTIONS: Be cautious when walking. Scan the area for obstacles that may increase the risk of trips and falls. When getting up in the mornings, sit up at the edge of the bed for a few minutes before getting out of bed. Consider elevating the bed at the head end to avoid drop of blood pressure when getting up. Walk always in a well-lit room (use night lights in the walls). Avoid area rugs or power cords from appliances in the middle of the walkways. Use a walker or a cane if necessary and consider physical therapy for balance exercise. Get your eyesight checked regularly.  FINANCIAL OVERSIGHT: Supervision, especially oversight when making financial decisions or transactions is also recommended.  HOME SAFETY: Consider the safety of the kitchen when operating appliances like stoves, microwave oven, and blender. Consider having supervision and share cooking responsibilities until no longer able to participate in those. Accidents with firearms and other hazards in the house should be identified and addressed as well.   ABILITY TO BE LEFT ALONE: If patient is unable to contact 911 operator, consider using LifeLine, or when the need is there, arrange for someone to stay with patients. Smoking is a fire hazard, consider supervision or cessation. Risk of wandering should be assessed by caregiver and if detected at any point, supervision and safe proof recommendations should be instituted.  MEDICATION SUPERVISION: Inability to self-administer medication needs to be constantly addressed. Implement a mechanism to ensure safe administration of the medications.   DRIVING: Regarding driving, in patients with progressive memory problems, driving will be impaired. We advise to have someone else do the driving if trouble finding directions or if  minor accidents are reported. Independent driving assessment is available to determine safety of driving.   If you are interested in the driving assessment, you can contact the following:  [employer] in [place]  [place] [phone] or [phone]      [place] refers to food and lifestyle choices that are based on the traditions of countries located on the [employer]. This way of eating has been shown to help prevent certain conditions and improve outcomes for people who have chronic diseases, like kidney disease and heart disease. What are tips for following this plan? Lifestyle  Cook and eat meals together with your family, when possible. Drink enough fluid to keep your urine clear or pale yellow. Be physically active every day. This includes: Aerobic exercise like running or swimming. Leisure activities like gardening, walking, or housework. Get 7-8 hours of sleep each night. If recommended by your health care provider, drink red wine in moderation. This means 1 glass a day for nonpregnant women and 2 glasses a day for men. A glass of wine equals 5 oz (150 mL). Reading food labels  Check the serving size of packaged foods. For foods such as rice and pasta, the serving size refers to the amount of cooked product, not dry. Check the total fat in packaged foods. Avoid foods that have saturated fat or trans fats. Check the ingredients list for added sugars, such as corn syrup.  Shopping  At the grocery store, buy most of your food from the areas near the walls of the store. This includes: Fresh fruits and vegetables (produce). Grains, beans, nuts, and seeds. Some of these may be available in unpackaged forms or large amounts (in bulk). Fresh seafood. Poultry and eggs. Low-fat dairy products. Buy whole ingredients instead of  prepackaged foods. Buy fresh fruits and vegetables in-season from local farmers markets. Buy frozen fruits and vegetables in resealable bags. If you do not have access to quality fresh seafood, buy precooked frozen shrimp or canned fish, such as tuna, salmon, or sardines. Buy small amounts of raw or cooked vegetables, salads, or olives from the deli or salad bar at your store. Stock your pantry so you always have certain foods on hand, such as olive oil, canned tuna, canned tomatoes, rice, pasta, and beans. Cooking  Cook foods with extra-virgin olive oil instead of using butter or other vegetable oils. Have meat as a side dish, and have vegetables or grains as your main dish. This means having meat in small portions or adding small amounts of meat to foods like pasta or stew. Use beans or vegetables instead of meat in common dishes like chili or lasagna. Experiment with different cooking methods. Try roasting or broiling vegetables instead of steaming or sauteing them. Add frozen vegetables to soups, stews, pasta, or rice. Add nuts or seeds for added healthy fat at each meal. You can add these to yogurt, salads, or vegetable dishes. Marinate fish or vegetables using olive oil, lemon juice, garlic, and fresh herbs. Meal planning  Plan to eat 1 vegetarian meal one day each week. Try to work up to 2 vegetarian meals, if possible. Eat seafood 2 or more times a week. Have healthy snacks readily available, such as: Vegetable sticks with hummus. Greek yogurt. Fruit and nut trail mix. Eat balanced meals throughout the week. This includes: Fruit: 2-3 servings a day Vegetables: 4-5 servings a day Low-fat dairy: 2 servings a day Fish, poultry, or lean meat: 1 serving a day Beans and legumes: 2 or more servings a week Nuts and seeds: 1-2 servings a day Whole grains: 6-8 servings a day Extra-virgin olive oil: 3-4 servings a day Limit red meat and sweets to only a few servings a month What are my  food choices? Mediterranean diet Recommended Grains: Whole-grain pasta. Brown rice. Bulgar wheat. Polenta. Couscous. Whole-wheat bread. [place]. Vegetables: Artichokes. Beets. Broccoli. Cabbage. Carrots. Eggplant. Green beans. Chard. Kale. Spinach. Onions. Leeks. Peas. Squash. Tomatoes. Peppers. Radishes. Fruits: Apples. Apricots. Avocado. Berries. Bananas. Cherries. Dates. Figs. Grapes. Lemons. Melon. Oranges. Peaches. Plums. Pomegranate. Meats and other protein foods: Beans. Almonds. Sunflower seeds. Pine nuts. Peanuts. [place]. Salmon. Scallops. Shrimp. [place]. Tilapia. Clams. Oysters. Eggs. Dairy: Low-fat milk. Cheese. Greek yogurt. Beverages: Water. Red wine. Herbal tea. Fats and oils: Extra virgin olive oil. Avocado oil. Grape seed oil. Sweets and desserts: [place] yogurt with honey. Baked apples. Poached pears. Trail mix. Seasoning and other foods: Basil. Cilantro. Coriander. Cumin. Mint. Parsley. Sage. Rosemary. Tarragon. Garlic. Oregano. Thyme. Pepper. Balsalmic vinegar. Tahini. Hummus. Tomato sauce. Olives. Mushrooms. Limit these Grains: Prepackaged pasta or rice dishes. Prepackaged cereal with added sugar. Vegetables: Deep fried potatoes (french fries). Fruits: Fruit canned in syrup. Meats and other protein foods: Beef. Pork. Lamb. Poultry with skin. Hot dogs. [place] Salines. Dairy: Ice cream. Sour cream. Whole milk. Beverages: Juice. Sugar-sweetened soft drinks. Beer. Liquor and spirits. Fats and oils: Butter. Canola oil. Vegetable oil. Beef fat (tallow). Lard. Sweets and  desserts: Cookies. Cakes. Pies. Candy. Seasoning and other foods: Mayonnaise. Premade sauces and marinades. The items listed may not be a complete list. Talk with your dietitian about what dietary choices are right for you. Summary The Mediterranean diet includes both food and lifestyle choices. Eat a variety of fresh fruits and vegetables, beans, nuts, seeds, and whole grains. Limit the amount of red meat and sweets  that you eat. Talk with your health care provider about whether it is safe for you to drink red wine in moderation. This means 1 glass a day for nonpregnant women and 2 glasses a day for men. A glass of wine equals 5 oz (150 mL). This information is not intended to replace advice given to you by your health care provider. Make sure you discuss any questions you have with your health care provider. Document Released: 06/17/2016 Document Revised: 07/20/2016 Document Reviewed: 06/17/2016 Elsevier Interactive Patient Education  2017 [employer].   We have sent a referral to [facility] for your MRI and they will call you directly to schedule your appointment. They are located at [place]. If you need to contact them directly please call [phone].

## 2022-08-30 ENCOUNTER — Telehealth: Payer: Self-pay | Admitting: Physician Assistant

## 2022-08-30 NOTE — Telephone Encounter (Signed)
Patient's daughter called and said she has some questions about a medication the patient is supposed to start.

## 2022-08-30 NOTE — Telephone Encounter (Signed)
Called [facility] and spoke to [place] and gave Sara's recommendations

## 2022-08-30 NOTE — Telephone Encounter (Signed)
Patient also taking sertraline 50 mg. Patients daughter making sure it is ok to take the sertraline and the memantine 10 mg  as well

## 2022-09-02 ENCOUNTER — Encounter (HOSPITAL_BASED_OUTPATIENT_CLINIC_OR_DEPARTMENT_OTHER): Payer: Self-pay | Admitting: Emergency Medicine

## 2022-09-02 ENCOUNTER — Emergency Department (HOSPITAL_BASED_OUTPATIENT_CLINIC_OR_DEPARTMENT_OTHER): Payer: Medicare Other

## 2022-09-02 ENCOUNTER — Other Ambulatory Visit: Payer: Self-pay

## 2022-09-02 ENCOUNTER — Emergency Department (HOSPITAL_BASED_OUTPATIENT_CLINIC_OR_DEPARTMENT_OTHER)
Admission: EM | Admit: 2022-09-02 | Discharge: 2022-09-02 | Disposition: A | Discharge status: Home / Self Care | Payer: Medicare Other | Attending: Emergency Medicine | Admitting: Emergency Medicine

## 2022-09-02 DIAGNOSIS — Y92410 Unspecified street and highway as the place of occurrence of the external cause: Secondary | ICD-10-CM | POA: Insufficient documentation

## 2022-09-02 DIAGNOSIS — S199XXA Unspecified injury of neck, initial encounter: Secondary | ICD-10-CM | POA: Diagnosis not present

## 2022-09-02 DIAGNOSIS — R519 Headache, unspecified: Secondary | ICD-10-CM | POA: Diagnosis not present

## 2022-09-02 DIAGNOSIS — S0990XA Unspecified injury of head, initial encounter: Secondary | ICD-10-CM | POA: Diagnosis not present

## 2022-09-02 DIAGNOSIS — Z7982 Long term (current) use of aspirin: Secondary | ICD-10-CM | POA: Insufficient documentation

## 2022-09-02 DIAGNOSIS — R0789 Other chest pain: Secondary | ICD-10-CM | POA: Insufficient documentation

## 2022-09-02 DIAGNOSIS — R079 Chest pain, unspecified: Secondary | ICD-10-CM | POA: Diagnosis present

## 2022-09-02 DIAGNOSIS — J479 Bronchiectasis, uncomplicated: Secondary | ICD-10-CM | POA: Diagnosis not present

## 2022-09-02 DIAGNOSIS — I700 Atherosclerosis of aorta: Secondary | ICD-10-CM | POA: Diagnosis not present

## 2022-09-02 DIAGNOSIS — J9811 Atelectasis: Secondary | ICD-10-CM | POA: Diagnosis not present

## 2022-09-02 DIAGNOSIS — E119 Type 2 diabetes mellitus without complications: Secondary | ICD-10-CM | POA: Diagnosis not present

## 2022-09-02 MED ORDER — ACETAMINOPHEN 325 MG PO TABS
650.0000 mg | ORAL_TABLET | Freq: Once | ORAL | Status: AC
  Administered 2022-09-02: 650 mg via ORAL
  Filled 2022-09-02: qty 2
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
## 2022-09-02 NOTE — Discharge Instructions (Signed)
Note the work-up today was overall reassuring.  The CT scan of your head, neck and chest showed no acute abnormalities.  Recommend treatment at home with Tylenol/ibuprofen as needed for pain.  Recommend follow-up with your PCP in 3 to 5 days for reevaluation of your symptoms.  Please do not hesitate to return to emergency department if the worrisome signs and symptoms we discussed become apparent.
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
## 2022-09-16 ENCOUNTER — Ambulatory Visit
Admission: RE | Admit: 2022-09-16 | Discharge: 2022-09-16 | Disposition: A | Discharge status: Home / Self Care | Payer: Medicare Other | Source: Ambulatory Visit | Attending: Physician Assistant | Admitting: Physician Assistant

## 2022-09-16 DIAGNOSIS — G319 Degenerative disease of nervous system, unspecified: Secondary | ICD-10-CM | POA: Diagnosis not present

## 2022-09-16 DIAGNOSIS — I671 Cerebral aneurysm, nonruptured: Secondary | ICD-10-CM | POA: Diagnosis not present

## 2022-09-16 DIAGNOSIS — J329 Chronic sinusitis, unspecified: Secondary | ICD-10-CM | POA: Diagnosis not present

## 2022-09-16 DIAGNOSIS — R413 Other amnesia: Secondary | ICD-10-CM | POA: Diagnosis not present

## 2022-09-19 NOTE — Progress Notes (Signed)
MRI brain remarkable  moderate chronic vessel changes, slightly worse when compared tot the MRI 10 years ago, again showing the old L stroke, mild atrophy of the brain, likely age related and some sinus issues (follow with primary doctor on the sinus) 8. Small right mastoid effusion. Continue to monitor the cholesterol, sugars, blood pressure (cardiovascular risk factors ) and continue baby ASA daily

## 2022-09-20 ENCOUNTER — Telehealth: Payer: Self-pay | Admitting: Physician Assistant

## 2022-09-20 NOTE — Telephone Encounter (Signed)
Called and left message to call office back

## 2022-09-20 NOTE — Telephone Encounter (Signed)
Pt's daughter called in to get the pt's MRI results

## 2022-09-22 NOTE — Telephone Encounter (Signed)
Pt's daughter called back in returning Christy's call 

## 2022-09-22 NOTE — Telephone Encounter (Signed)
Phone just got cut off at 4:14pm, will try back

## 2022-09-22 NOTE — Telephone Encounter (Signed)
Pt's daughter called again in to get the pt's MRI results

## 2022-09-23 DIAGNOSIS — E538 Deficiency of other specified B group vitamins: Secondary | ICD-10-CM | POA: Diagnosis not present

## 2022-09-23 NOTE — Telephone Encounter (Signed)
I advised daughter of  MRI results, voiced understanding and thanked me for calling.

## 2022-10-21 DIAGNOSIS — E538 Deficiency of other specified B group vitamins: Secondary | ICD-10-CM | POA: Diagnosis not present

## 2022-11-08 ENCOUNTER — Other Ambulatory Visit: Payer: Self-pay | Admitting: Internal Medicine

## 2022-11-18 DIAGNOSIS — E538 Deficiency of other specified B group vitamins: Secondary | ICD-10-CM | POA: Diagnosis not present

## 2022-11-30 ENCOUNTER — Ambulatory Visit: Payer: Medicare Other | Admitting: Physician Assistant

## 2022-12-06 ENCOUNTER — Telehealth: Payer: Self-pay | Admitting: Internal Medicine

## 2022-12-06 MED ORDER — MESALAMINE ER 0.375 G PO CP24: ORAL_CAPSULE | ORAL | 1 refills | Status: DC

## 2022-12-06 NOTE — Telephone Encounter (Signed)
Inbound call from patient requesting a refill for "mesalamina 0,[phone]".Please call patient to let her know once its done Thanks

## 2022-12-06 NOTE — Telephone Encounter (Signed)
I have spoken to patient to advise that she is due for follow up with Dr Hilarie Fredrickson. She has scheduled an appointment for 02/08/23 at 830 am, his next availability. I have advised that I will send her enough medication to get her to her appointment and she verbalizes understanding of this information.

## 2022-12-07 NOTE — Telephone Encounter (Signed)
Patient is calling to check up on status of medication refill

## 2022-12-07 NOTE — Telephone Encounter (Signed)
Patient informed that Dottie sent in her rx yesterday to [facility].

## 2022-12-22 DIAGNOSIS — E538 Deficiency of other specified B group vitamins: Secondary | ICD-10-CM | POA: Diagnosis not present

## 2022-12-25 ENCOUNTER — Ambulatory Visit
Admission: EM | Admit: 2022-12-25 | Discharge: 2022-12-25 | Disposition: A | Discharge status: Home / Self Care | Payer: Medicare Other | Attending: Physician Assistant | Admitting: Physician Assistant

## 2022-12-25 ENCOUNTER — Encounter: Payer: Self-pay | Admitting: Emergency Medicine

## 2022-12-25 ENCOUNTER — Other Ambulatory Visit: Payer: Self-pay

## 2022-12-25 DIAGNOSIS — N611 Abscess of the breast and nipple: Secondary | ICD-10-CM | POA: Diagnosis not present

## 2022-12-25 MED ORDER — DOXYCYCLINE HYCLATE 100 MG PO CAPS: 100.0000 mg | ORAL_CAPSULE | Freq: Two times a day (BID) | ORAL | 0 refills | Status: DC

## 2022-12-25 NOTE — ED Triage Notes (Signed)
Pt here for red painful area to right breast x 2 days

## 2022-12-26 NOTE — ED Provider Notes (Signed)
[place]    CSN: [id number] Arrival date & time: 12/25/22  1442      History   Chief Complaint Chief Complaint  Patient presents with   Wound Check    HPI Erica Carter is a 88 y.o. female.   Patient here today for evaluation of red painful area to right breast that she first noticed 2 days ago. She has not had any drainage. She denies any fever. Her daughter is here with her today. She does not report treatment.  The history is provided by the patient.  Wound Check Pertinent negatives include no abdominal pain.    Past Medical History:  Diagnosis Date   Acute gastritis without mention of hemorrhage    Diabetes mellitus without complication (HCC)    diet control, per pt   Diverticulosis of colon (without mention of hemorrhage)    Esophageal dysmotilities    Esophageal reflux    Hiatal hernia    Other and unspecified hyperlipidemia    Personal history of colonic polyps 09/11/2010   hyperplasic   Skin cancer    Stricture and stenosis of esophagus    Ulcerative colitis, unspecified    Unspecified essential hypertension    Wears dentures    upper and lower   Wears glasses     Patient Active Problem List   Diagnosis Date Noted   Hiatal hernia    Gastric polyp    Status post epidural steroid injection 08/04/2017   Unilateral primary osteoarthritis, left hip 06/17/2017   History of gastroesophageal reflux (GERD) 10/26/2011   GASTRITIS, ACUTE W/O HEMORRHAGE 09/11/2010   HYPERLIPIDEMIA 02/12/2008   Essential hypertension 02/12/2008   GERD 02/12/2008   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   HIATAL HERNIA 05/17/2006   COLITIS, ULCERATIVE 05/17/2006   DIVERTICULOSIS, COLON 05/17/2006    Past Surgical History:  Procedure Laterality Date   BRAVO [place] STUDY N/A 09/07/2019   Procedure: BRAVO [place] STUDY;  Surgeon: Lavena Bullion, DO;  Location: [facility];  Service: Gastroenterology;  Laterality: N/A;   BUNIONECTOMY     Bil   CATARACT EXTRACTION Bilateral     ESOPHAGEAL MANOMETRY N/A 08/08/2019   Procedure: ESOPHAGEAL MANOMETRY (EM);  Surgeon: Mauri Pole, MD;  Location: [facility];  Service: Endoscopy;  Laterality: N/A;   ESOPHAGOGASTRODUODENOSCOPY (EGD) WITH PROPOFOL N/A 09/07/2019   Procedure: ESOPHAGOGASTRODUODENOSCOPY (EGD) WITH PROPOFOL;  Surgeon: Lavena Bullion, DO;  Location: [facility];  Service: Gastroenterology;  Laterality: N/A;   HEMOSTASIS CLIP PLACEMENT  09/07/2019   Procedure: HEMOSTASIS CLIP PLACEMENT;  Surgeon: Lavena Bullion, DO;  Location: [facility];  Service: Gastroenterology;;   NASAL SINUS SURGERY     POLYPECTOMY  09/07/2019   Procedure: POLYPECTOMY;  Surgeon: Lavena Bullion, DO;  Location: [facility];  Service: Gastroenterology;;   THUMB ARTHROSCOPY Left    TUBAL LIGATION      OB History   No obstetric history on file.      Home Medications    Prior to Admission medications   Medication Sig Start Date End Date Taking? Authorizing Provider  doxycycline (VIBRAMYCIN) 100 MG capsule Take 1 capsule (100 mg total) by mouth 2 (two) times daily. 12/25/22  Yes Francene Finders, PA-C  amLODipine (NORVASC) 5 MG tablet Take 5 mg by mouth daily.    [provider]  aspirin 81 MG tablet Take 81 mg by mouth daily.    [provider]  atorvastatin (LIPITOR) 20 MG tablet Take 20 mg by mouth daily.  [provider]  calcium gluconate 500 MG tablet Take 500 mg by mouth daily.      [provider]  cephALEXin (KEFLEX) 500 MG capsule Take 1 capsule (500 mg total) by mouth 4 (four) times daily. 05/28/22   Mesner, Corene Cornea, MD  cholecalciferol (VITAMIN D) 1000 UNITS tablet Take 1,000 Units by mouth daily.      [provider]  Cyanocobalamin (VITAMIN B-12 IJ) Inject 1,000 mcg as directed every 30 (thirty) days. Pt gets shot once a month     [provider]  losartan (COZAAR) 100 MG tablet Take 100 mg by mouth daily.      [provider]  memantine  (NAMENDA) 10 MG tablet Take 1 tablet (10 mg at night) for 2 weeks, then increase to 1 tablet (10 mg) twice a day 08/26/22   Rondel Jumbo, PA-C  mesalamine (APRISO) 0.375 g 24 hr capsule TAKE 4 CAPSULES BY MOUTH ONCE DAILY. Must have office visit for further refills. 12/06/22   Pyrtle, Lajuan Lines, MD  Misc Natural Products (COLON CARE PO) Take 1 tablet by mouth daily as needed (constipation).     [provider]  Multiple Vitamins-Minerals (CENTRUM SILVER PO) Take 1 tablet by mouth daily.      [provider]  pantoprazole (PROTONIX) 40 MG tablet TAKE 1 TABLET BY MOUTH TWICE DAILY BEFORE A MEAL 08/19/22   Pyrtle, Lajuan Lines, MD  predniSONE (DELTASONE) 20 MG tablet 3 tabs po daily x 3 days, then 2 tabs x 3 days, then 1.5 tabs x 3 days, then 1 tab x 3 days, then 0.5 tabs x 3 days Patient not taking: Reported on 12/25/2022 05/28/22   Mesner, Corene Cornea, MD  sertraline (ZOLOFT) 50 MG tablet SMARTSIG:1 Tablet(s) By Mouth Every Evening 06/26/22   [provider]  vitamin B-12 (CYANOCOBALAMIN) 500 MCG tablet Take 500 mcg by mouth daily.    [provider]    Family History Family History  Problem Relation Age of Onset   Thyroid cancer Brother        mets to brain   Heart disease Brother    Diabetes Mother    Diabetes Sister    Heart disease Father    Heart disease Sister    Heart disease Brother    Heart disease Brother    Heart disease Brother    Colon cancer Neg Hx    Inflammatory bowel disease Neg Hx    Liver disease Neg Hx    Stomach cancer Neg Hx    Pancreatic cancer Neg Hx     Social History Social History   Tobacco Use   Smoking status: Former   Smokeless tobacco: Never   Tobacco comments:    stopped over 20 years  Vaping Use   Vaping Use: Never used  Substance Use Topics   Alcohol use: No   Drug use: No     Allergies   Codeine   Review of Systems Review of Systems  Constitutional:  Negative for chills and fever.  Eyes:  Negative for discharge  and redness.  Gastrointestinal:  Negative for abdominal pain, nausea and vomiting.  Skin:  Positive for color change. Negative for wound.     Physical Exam Triage Vital Signs ED Triage Vitals [12/25/22 1522]  Enc Vitals Group     BP 129/60     Pulse Rate 82     Resp 18     Temp 97.6 F (36.4 C)     Temp Source  Oral     SpO2 95 %     Weight      Height      Head Circumference      Peak Flow      Pain Score 3     Pain Loc      Pain Edu?      Excl. in [place]?    No data found.  Updated Vital Signs BP 129/60 (BP Location: Left Arm)   Pulse 82   Temp 97.6 F (36.4 C) (Oral)   Resp 18   SpO2 95%   Visual Acuity Right Eye Distance:   Left Eye Distance:   Bilateral Distance:    Right Eye Near:   Left Eye Near:    Bilateral Near:     Physical Exam Vitals and nursing note reviewed.  Constitutional:      General: She is not in acute distress.    Appearance: Normal appearance. She is not ill-appearing.  HENT:     Head: Normocephalic and atraumatic.  Eyes:     Conjunctiva/sclera: Conjunctivae normal.  Cardiovascular:     Rate and Rhythm: Normal rate.  Pulmonary:     Effort: Pulmonary effort is normal. No respiratory distress.  Chest:       Comments: Approx 3 cm area of erythema, mild swelling noted to 6 o'clock position just inferior to nipple, no active bleeding or drainage noted Neurological:     Mental Status: She is alert.  Psychiatric:        Mood and Affect: Mood normal.        Behavior: Behavior normal.        Thought Content: Thought content normal.      UC Treatments / Results  Labs (all labs ordered are listed, but only abnormal results are displayed) Labs Reviewed - No data to display  EKG   Radiology No results found.  Procedures Procedures (including critical care time)  Medications Ordered in UC Medications - No data to display  Initial Impression / Assessment and Plan / UC Course  I have reviewed the triage vital signs and the  nursing notes.  Pertinent labs & imaging results that were available during my care of the patient were reviewed by me and considered in my medical decision making (see chart for details).    Will treat to cover abscess with doxycycline and recommended follow up if no gradual improvement or with any further concerns. Patient and daughter express understanding.   Final Clinical Impressions(s) / UC Diagnoses   Final diagnoses:  Abscess of right breast   Discharge Instructions   None    ED Prescriptions     Medication Sig Dispense Auth. Provider   doxycycline (VIBRAMYCIN) 100 MG capsule Take 1 capsule (100 mg total) by mouth 2 (two) times daily. 20 capsule Francene Finders, PA-C      PDMP not reviewed this encounter.   Francene Finders, PA-C 12/26/22 1102

## 2023-01-03 ENCOUNTER — Ambulatory Visit: Payer: Medicare Other | Admitting: Physician Assistant

## 2023-01-03 ENCOUNTER — Encounter: Payer: Self-pay | Admitting: Physician Assistant

## 2023-01-03 ENCOUNTER — Other Ambulatory Visit: Payer: Self-pay | Admitting: Internal Medicine

## 2023-01-03 VITALS — BP 152/85 | HR 78 | Wt 115.3 lb

## 2023-01-03 DIAGNOSIS — F0280 Dementia in other diseases classified elsewhere without behavioral disturbance: Secondary | ICD-10-CM

## 2023-01-03 DIAGNOSIS — G309 Alzheimer's disease, unspecified: Secondary | ICD-10-CM

## 2023-01-03 MED ORDER — MEMANTINE HCL 10 MG PO TABS: ORAL_TABLET | ORAL | 11 refills | Status: DC
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
## 2023-01-03 NOTE — Patient Instructions (Signed)
It was a pleasure to see you today at our office.   Recommendations:  Follow up in 6 months  Memantine 10 mg  twice daily.Side effects were discussed  Continue the other medications as per primary doctor Recommend no furhter driving   Whom to call:  Memory  decline, memory medications: Call our office [phone]   For psychiatric meds, mood meds: Please have your primary care physician manage these medications.   Counseling regarding caregiver distress, including caregiver depression, anxiety and issues regarding community resources, adult day care programs, adult living facilities, or memory care questions:   Feel free to contact [place], Social Worker at [phone]   For assessment of decision of mental capacity and competency:  Call Dr. Anthoney Harada, geriatric psychiatrist at [phone]  For guidance in geriatric dementia issues please call [facility] [phone]  For guidance regarding [facility] and if placement were needed at the facility, contact Arnell Asal, Social Worker tel: [phone]  If you have any severe symptoms of a stroke, or other severe issues such as confusion,severe chills or fever, etc call 911 or go to the ER as you may need to be evaluated further   Feel free to visit Facebook page " [email or web address]" for tips of how to care for people with memory problems.       RECOMMENDATIONS FOR ALL PATIENTS WITH MEMORY PROBLEMS: 1. Continue to exercise (Recommend 30 minutes of walking everyday, or 3 hours every week) 2. Increase social interactions - continue going to [place] and enjoy social gatherings with friends and family 3. Eat healthy, avoid fried foods and eat more fruits and vegetables 4. Maintain adequate blood pressure, blood sugar, and blood cholesterol level. Reducing the risk of stroke and cardiovascular disease also helps promoting better memory. 5. Avoid stressful situations. Live a simple life and  avoid aggravations. Organize your time and prepare for the next day in anticipation. 6. Sleep well, avoid any interruptions of sleep and avoid any distractions in the bedroom that may interfere with adequate sleep quality 7. Avoid sugar, avoid sweets as there is a strong link between excessive sugar intake, diabetes, and cognitive impairment We discussed the Mediterranean diet, which has been shown to help patients reduce the risk of progressive memory disorders and reduces cardiovascular risk. This includes eating fish, eat fruits and green leafy vegetables, nuts like almonds and hazelnuts, walnuts, and also use olive oil. Avoid fast foods and fried foods as much as possible. Avoid sweets and sugar as sugar use has been linked to worsening of memory function.  There is always a concern of gradual progression of memory problems. If this is the case, then we may need to adjust level of care according to patient needs. Support, both to the patient and caregiver, should then be put into place.    The Alzheimer's Association is here all day, every day for people facing Alzheimer's disease through our free 24/7 Helpline: [phone]. The Helpline provides reliable information and support to all those who need assistance, such as individuals living with memory loss, Alzheimer's or other dementia, caregivers, health care professionals and the public.  Our highly trained and knowledgeable staff can help you with: Understanding memory loss, dementia and Alzheimer's  Medications and other treatment options  General information about aging and brain health  Skills to provide quality care and to find the best care from professionals  Legal, financial and living-arrangement decisions Our Helpline also features: Confidential care consultation provided by master's level  clinicians who can help with decision-making support, crisis assistance and education on issues families face every day  Help in a caller's  preferred language using our translation service that features more than 200 languages and dialects  Referrals to local community programs, services and ongoing support     FALL PRECAUTIONS: Be cautious when walking. Scan the area for obstacles that may increase the risk of trips and falls. When getting up in the mornings, sit up at the edge of the bed for a few minutes before getting out of bed. Consider elevating the bed at the head end to avoid drop of blood pressure when getting up. Walk always in a well-lit room (use night lights in the walls). Avoid area rugs or power cords from appliances in the middle of the walkways. Use a walker or a cane if necessary and consider physical therapy for balance exercise. Get your eyesight checked regularly.  FINANCIAL OVERSIGHT: Supervision, especially oversight when making financial decisions or transactions is also recommended.  HOME SAFETY: Consider the safety of the kitchen when operating appliances like stoves, microwave oven, and blender. Consider having supervision and share cooking responsibilities until no longer able to participate in those. Accidents with firearms and other hazards in the house should be identified and addressed as well.   ABILITY TO BE LEFT ALONE: If patient is unable to contact 911 operator, consider using LifeLine, or when the need is there, arrange for someone to stay with patients. Smoking is a fire hazard, consider supervision or cessation. Risk of wandering should be assessed by caregiver and if detected at any point, supervision and safe proof recommendations should be instituted.  MEDICATION SUPERVISION: Inability to self-administer medication needs to be constantly addressed. Implement a mechanism to ensure safe administration of the medications.   DRIVING: Regarding driving, in patients with progressive memory problems, driving will be impaired. We advise to have someone else do the driving if trouble finding  directions or if minor accidents are reported. Independent driving assessment is available to determine safety of driving.   If you are interested in the driving assessment, you can contact the following:  [employer] in [place]  [place] [phone] or [phone]      [place] refers to food and lifestyle choices that are based on the traditions of countries located on the [employer]. This way of eating has been shown to help prevent certain conditions and improve outcomes for people who have chronic diseases, like kidney disease and heart disease. What are tips for following this plan? Lifestyle  Cook and eat meals together with your family, when possible. Drink enough fluid to keep your urine clear or pale yellow. Be physically active every day. This includes: Aerobic exercise like running or swimming. Leisure activities like gardening, walking, or housework. Get 7-8 hours of sleep each night. If recommended by your health care provider, drink red wine in moderation. This means 1 glass a day for nonpregnant women and 2 glasses a day for men. A glass of wine equals 5 oz (150 mL). Reading food labels  Check the serving size of packaged foods. For foods such as rice and pasta, the serving size refers to the amount of cooked product, not dry. Check the total fat in packaged foods. Avoid foods that have saturated fat or trans fats. Check the ingredients list for added sugars, such as corn syrup. Shopping  At the  grocery store, buy most of your food from the areas near the walls of the store. This includes: Fresh fruits and vegetables (produce). Grains, beans, nuts, and seeds. Some of these may be available in unpackaged forms or large amounts (in bulk). Fresh seafood. Poultry and eggs. Low-fat dairy products. Buy whole  ingredients instead of prepackaged foods. Buy fresh fruits and vegetables in-season from local farmers markets. Buy frozen fruits and vegetables in resealable bags. If you do not have access to quality fresh seafood, buy precooked frozen shrimp or canned fish, such as tuna, salmon, or sardines. Buy small amounts of raw or cooked vegetables, salads, or olives from the deli or salad bar at your store. Stock your pantry so you always have certain foods on hand, such as olive oil, canned tuna, canned tomatoes, rice, pasta, and beans. Cooking  Cook foods with extra-virgin olive oil instead of using butter or other vegetable oils. Have meat as a side dish, and have vegetables or grains as your main dish. This means having meat in small portions or adding small amounts of meat to foods like pasta or stew. Use beans or vegetables instead of meat in common dishes like chili or lasagna. Experiment with different cooking methods. Try roasting or broiling vegetables instead of steaming or sauteing them. Add frozen vegetables to soups, stews, pasta, or rice. Add nuts or seeds for added healthy fat at each meal. You can add these to yogurt, salads, or vegetable dishes. Marinate fish or vegetables using olive oil, lemon juice, garlic, and fresh herbs. Meal planning  Plan to eat 1 vegetarian meal one day each week. Try to work up to 2 vegetarian meals, if possible. Eat seafood 2 or more times a week. Have healthy snacks readily available, such as: Vegetable sticks with hummus. Greek yogurt. Fruit and nut trail mix. Eat balanced meals throughout the week. This includes: Fruit: 2-3 servings a day Vegetables: 4-5 servings a day Low-fat dairy: 2 servings a day Fish, poultry, or lean meat: 1 serving a day Beans and legumes: 2 or more servings a week Nuts and seeds: 1-2 servings a day Whole grains: 6-8 servings a day Extra-virgin olive oil: 3-4 servings a day Limit red meat and sweets to only a few servings  a month What are my food choices? Mediterranean diet Recommended Grains: Whole-grain pasta. Brown rice. Bulgar wheat. Polenta. Couscous. Whole-wheat bread. [place]. Vegetables: Artichokes. Beets. Broccoli. Cabbage. Carrots. Eggplant. Green beans. Chard. Kale. Spinach. Onions. Leeks. Peas. Squash. Tomatoes. Peppers. Radishes. Fruits: Apples. Apricots. Avocado. Berries. Bananas. Cherries. Dates. Figs. Grapes. Lemons. Melon. Oranges. Peaches. Plums. Pomegranate. Meats and other protein foods: Beans. Almonds. Sunflower seeds. Pine nuts. Peanuts. [place]. Salmon. Scallops. Shrimp. [place]. Tilapia. Clams. Oysters. Eggs. Dairy: Low-fat milk. Cheese. Greek yogurt. Beverages: Water. Red wine. Herbal tea. Fats and oils: Extra virgin olive oil. Avocado oil. Grape seed oil. Sweets and desserts: [place] yogurt with honey. Baked apples. Poached pears. Trail mix. Seasoning and other foods: Basil. Cilantro. Coriander. Cumin. Mint. Parsley. Sage. Rosemary. Tarragon. Garlic. Oregano. Thyme. Pepper. Balsalmic vinegar. Tahini. Hummus. Tomato sauce. Olives. Mushrooms. Limit these Grains: Prepackaged pasta or rice dishes. Prepackaged cereal with added sugar. Vegetables: Deep fried potatoes (french fries). Fruits: Fruit canned in syrup. Meats and other protein foods: Beef. Pork. Lamb. Poultry with skin. Hot dogs. Berniece Salines. Dairy: Ice cream. Sour cream. Whole milk. Beverages: Juice. Sugar-sweetened soft drinks. Beer. Liquor and spirits. Fats and oils: Butter. Canola oil. Vegetable oil. Beef fat (tallow). Lard. Sweets and desserts: Cookies. Cakes. Pies.  Candy. Seasoning and other foods: Mayonnaise. Premade sauces and marinades. The items listed may not be a complete list. Talk with your dietitian about what dietary choices are right for you. Summary The Mediterranean diet includes both food and lifestyle choices. Eat a variety of fresh fruits and vegetables, beans, nuts, seeds, and whole grains. Limit the amount of  red meat and sweets that you eat. Talk with your health care provider about whether it is safe for you to drink red wine in moderation. This means 1 glass a day for nonpregnant women and 2 glasses a day for men. A glass of wine equals 5 oz (150 mL). This information is not intended to replace advice given to you by your health care provider. Make sure you discuss any questions you have with your health care provider. Document Released: 06/17/2016 Document Revised: 07/20/2016 Document Reviewed: 06/17/2016 Elsevier Interactive Patient Education  2017 [employer].   We have sent a referral to [facility] for your MRI and they will call you directly to schedule your appointment. They are located at [place]. If you need to contact them directly please call [phone].

## 2023-01-20 DIAGNOSIS — E538 Deficiency of other specified B group vitamins: Secondary | ICD-10-CM | POA: Diagnosis not present

## 2023-01-31 ENCOUNTER — Other Ambulatory Visit: Payer: Self-pay | Admitting: Internal Medicine

## 2023-02-08 ENCOUNTER — Encounter: Payer: Self-pay | Admitting: Internal Medicine

## 2023-02-08 ENCOUNTER — Ambulatory Visit: Payer: Medicare Other | Admitting: Internal Medicine

## 2023-02-08 VITALS — BP 104/68 | HR 88 | Ht 60.0 in | Wt 110.4 lb

## 2023-02-08 DIAGNOSIS — K449 Diaphragmatic hernia without obstruction or gangrene: Secondary | ICD-10-CM

## 2023-02-08 DIAGNOSIS — K219 Gastro-esophageal reflux disease without esophagitis: Secondary | ICD-10-CM

## 2023-02-08 DIAGNOSIS — K5100 Ulcerative (chronic) pancolitis without complications: Secondary | ICD-10-CM

## 2023-02-08 DIAGNOSIS — E538 Deficiency of other specified B group vitamins: Secondary | ICD-10-CM

## 2023-02-08 DIAGNOSIS — K5909 Other constipation: Secondary | ICD-10-CM

## 2023-02-08 MED ORDER — MESALAMINE ER 0.375 G PO CP24: ORAL_CAPSULE | ORAL | 3 refills | Status: DC

## 2023-02-08 MED ORDER — PANTOPRAZOLE SODIUM 40 MG PO TBEC: 40.0000 mg | DELAYED_RELEASE_TABLET | Freq: Two times a day (BID) | ORAL | 3 refills | Status: DC

## 2023-02-08 NOTE — Patient Instructions (Addendum)
_______________________________________________________  If your blood pressure at your visit was 140/90 or greater, please contact your primary care physician to follow up on this. _______________________________________________________  If you are age 86 or older, your body mass index should be between 23-30. Your Body mass index is 21.56 kg/m. If this is out of the aforementioned range listed, please consider follow up with your Primary Care Provider. ________________________________________________________  The [facility] providers would like to encourage you to use [facility] to communicate with providers for non-urgent requests or questions.  Due to long hold times on the telephone, sending your provider a message by [facility] may be a faster and more efficient way to get a response.  Please allow 48 business hours for a response.  Please remember that this is for non-urgent requests.  _______________________________________________________  We have sent the following medications to your pharmacy for you to pick up at your convenience:  CONTINUE: pantoprazole CONTINUE: Apriso CONTINUE: B12 injections  You will be seen in follow up in 1 year.  Thank you for entrusting me with your care and choosing [facility].  Dr Hilarie Fredrickson

## 2023-02-08 NOTE — Progress Notes (Signed)
Subjective:    Patient ID: Erica Carter, female    DOB: May 07, 1935, 88 y.o.   MRN: [id number]  HPI Erica Carter is an 88 year old female with a history of ulcerative colitis, colonic polyps, GERD with hiatal hernia, chronic constipation after colitis in remission, dementia who is here for follow-up.  She is here today with her daughter.  She was last seen in January 2023.  She was changed to pantoprazole from omeprazole at last visit.  Her heartburn is well-controlled but she will still have regurgitation which is not painful if she bends over or is active after eating.  She denies dysphagia and odynophagia.  She reports her bowel movements are regular but she does use senna to help maintain regularity.  She has not seen blood in her stool, melena or mucus.  She denies abdominal pain.  Her daughter states that her appetite varies but she does not eat large meals anymore.  She has continued Apriso 1.5 g daily and monthly B12 injections. She has been started on Namenda  Review of Systems As per HPI, otherwise negative  Current Medications, Allergies, Past Medical History, Past Surgical History, Family History and Social History were reviewed in [employer] record.     Objective:   Physical Exam BP 104/68 (BP Location: Left Arm, Patient Position: Sitting, Cuff Size: Normal)   Pulse 88   Ht 5' (1.524 m)   Wt 110 lb 6 oz (50.1 kg)   BMI 21.56 kg/m  Gen: awake, alert, NAD HEENT: anicteric  CV: RRR, no mrg Pulm: CTA b/l Abd: soft, NT/ND, +BS throughout Ext: no c/c/e Neuro: nonfocal     Latest Ref Rng & Units 05/27/2022    7:57 PM 02/28/2021    5:11 PM 07/12/2017    2:17 PM  CBC  WBC 4.0 - 10.5 K/uL 9.1  6.8    Hemoglobin 12.0 - 15.0 g/dL 14.5  13.9  11.6   Hematocrit 36.0 - 46.0 % 44.8  43.5  34.0   Platelets 150 - 400 K/uL 355  253     CMP     Component Value Date/Time   NA 142 05/27/2022 1957   K 4.9 05/27/2022 1957   CL 105 05/27/2022 1957   CO2  28 05/27/2022 1957   GLUCOSE 119 (H) 05/27/2022 1957   BUN 7 (L) 05/27/2022 1957   CREATININE 0.77 05/27/2022 1957   CALCIUM 9.3 05/27/2022 1957   PROT 5.3 (L) 07/12/2017 1404   ALBUMIN 3.0 (L) 07/12/2017 1404   AST 25 07/12/2017 1404   ALT 15 07/12/2017 1404   ALKPHOS 62 07/12/2017 1404   BILITOT 0.8 07/12/2017 1404   GFRNONAA >60 05/27/2022 1957   GFRAA 59 (L) 07/12/2017 1404         Assessment & Plan:  88 year old female with a history of ulcerative colitis, colonic polyps, GERD with hiatal hernia, chronic constipation after colitis in remission, dementia who is here for follow-up.  UC --in clinical remission on Apriso.  Surveillance colonoscopy discontinued based on age. -- Continue Apriso 1.5 g daily  2.  GERD with regurgitation and hiatal hernia --heartburn symptoms seem well-controlled on twice daily PPI.  She does not have dysphagia or alarm symptom.  Her regurgitation is really mechanical related to her hiatal hernia.  Surgery not recommended given lack of troublesome symptoms and age. -- Continue pantoprazole 40 mg twice daily AC -- Antireflux precaution  3.  Chronic constipation --this has been an issue for her since UC has  come into remission over the years.  She is using senna over-the-counter which is working well -- Senna over-the-counter per box instruction as needed  4.  B12 deficiency --continue monthly IM B12.  Check B12 annually per primary care  Annual follow-up, sooner if needed

## 2023-02-21 DIAGNOSIS — E782 Mixed hyperlipidemia: Secondary | ICD-10-CM | POA: Diagnosis not present

## 2023-02-21 DIAGNOSIS — I129 Hypertensive chronic kidney disease with stage 1 through stage 4 chronic kidney disease, or unspecified chronic kidney disease: Secondary | ICD-10-CM | POA: Diagnosis not present

## 2023-02-21 DIAGNOSIS — Z23 Encounter for immunization: Secondary | ICD-10-CM | POA: Diagnosis not present

## 2023-02-21 DIAGNOSIS — N183 Chronic kidney disease, stage 3 unspecified: Secondary | ICD-10-CM | POA: Diagnosis not present

## 2023-02-21 DIAGNOSIS — E1122 Type 2 diabetes mellitus with diabetic chronic kidney disease: Secondary | ICD-10-CM | POA: Diagnosis not present

## 2023-02-21 DIAGNOSIS — D692 Other nonthrombocytopenic purpura: Secondary | ICD-10-CM | POA: Diagnosis not present

## 2023-02-21 DIAGNOSIS — G309 Alzheimer's disease, unspecified: Secondary | ICD-10-CM | POA: Diagnosis not present

## 2023-03-03 DIAGNOSIS — H353111 Nonexudative age-related macular degeneration, right eye, early dry stage: Secondary | ICD-10-CM | POA: Diagnosis not present

## 2023-03-03 DIAGNOSIS — Z961 Presence of intraocular lens: Secondary | ICD-10-CM | POA: Diagnosis not present

## 2023-03-03 DIAGNOSIS — E119 Type 2 diabetes mellitus without complications: Secondary | ICD-10-CM | POA: Diagnosis not present

## 2023-03-03 DIAGNOSIS — H52203 Unspecified astigmatism, bilateral: Secondary | ICD-10-CM | POA: Diagnosis not present

## 2023-03-29 DIAGNOSIS — E538 Deficiency of other specified B group vitamins: Secondary | ICD-10-CM | POA: Diagnosis not present

## 2023-04-26 DIAGNOSIS — E538 Deficiency of other specified B group vitamins: Secondary | ICD-10-CM | POA: Diagnosis not present

## 2023-07-07 ENCOUNTER — Ambulatory Visit: Payer: Medicare Other | Admitting: Physician Assistant

## 2023-08-17 ENCOUNTER — Ambulatory Visit: Payer: Medicare Other | Admitting: Physician Assistant

## 2023-08-17 ENCOUNTER — Encounter: Payer: Self-pay | Admitting: Physician Assistant

## 2023-08-17 VITALS — BP 128/74 | HR 100 | Resp 18 | Ht 60.0 in | Wt 111.0 lb

## 2023-08-17 DIAGNOSIS — F0280 Dementia in other diseases classified elsewhere without behavioral disturbance: Secondary | ICD-10-CM | POA: Diagnosis not present

## 2023-08-17 DIAGNOSIS — G309 Alzheimer's disease, unspecified: Secondary | ICD-10-CM | POA: Diagnosis not present

## 2023-08-17 MED ORDER — MEMANTINE HCL 10 MG PO TABS: ORAL_TABLET | ORAL | 11 refills | Status: DC

## 2023-08-17 NOTE — Patient Instructions (Signed)
It was a pleasure to see you today at our office.   Recommendations:  Follow up in 6 months  Memantine 10 mg  twice daily.Side effects were discussed  Continue the other medications as per primary doctor Continue B12 supplements   Whom to call:  Memory  decline, memory medications: Call our office [phone]   For psychiatric meds, mood meds: Please have your primary care physician manage these medications.   Counseling regarding caregiver distress, including caregiver depression, anxiety and issues regarding community resources, adult day care programs, adult living facilities, or memory care questions:   Feel free to contact Misty Lisabeth Register, Social Worker at [phone]   For assessment of decision of mental capacity and competency:  Call Dr. Erick Blinks, geriatric psychiatrist at [phone]  For guidance in geriatric dementia issues please call [facility] [phone]     If you have any severe symptoms of a stroke, or other severe issues such as confusion,severe chills or fever, etc call 911 or go to the ER as you may need to be evaluated further   Feel free to visit Facebook page " [facility]" for tips of how to care for people with memory problems.       RECOMMENDATIONS FOR ALL PATIENTS WITH MEMORY PROBLEMS: 1. Continue to exercise (Recommend 30 minutes of walking everyday, or 3 hours every week) 2. Increase social interactions - continue going to [place] and enjoy social gatherings with friends and family 3. Eat healthy, avoid fried foods and eat more fruits and vegetables 4. Maintain adequate blood pressure, blood sugar, and blood cholesterol level. Reducing the risk of stroke and cardiovascular disease also helps promoting better memory. 5. Avoid stressful situations. Live a simple life and avoid aggravations. Organize your time and prepare for the next day in anticipation. 6. Sleep well, avoid any interruptions of sleep and avoid any distractions  in the bedroom that may interfere with adequate sleep quality 7. Avoid sugar, avoid sweets as there is a strong link between excessive sugar intake, diabetes, and cognitive impairment We discussed the Mediterranean diet, which has been shown to help patients reduce the risk of progressive memory disorders and reduces cardiovascular risk. This includes eating fish, eat fruits and green leafy vegetables, nuts like almonds and hazelnuts, walnuts, and also use olive oil. Avoid fast foods and fried foods as much as possible. Avoid sweets and sugar as sugar use has been linked to worsening of memory function.  There is always a concern of gradual progression of memory problems. If this is the case, then we may need to adjust level of care according to patient needs. Support, both to the patient and caregiver, should then be put into place.    The Alzheimer's Association is here all day, every day for people facing Alzheimer's disease through our free 24/7 Helpline: [phone] [phone]. The Helpline provides reliable information and support to all those who need assistance, such as individuals living with memory loss, Alzheimer's or other dementia, caregivers, health care professionals and the public.  Our highly trained and knowledgeable staff can help you with: Understanding memory loss, dementia and Alzheimer's  Medications and other treatment options  General information about aging and brain health  Skills to provide quality care and to find the best care from professionals  Legal, financial and living-arrangement decisions Our Helpline also features: Confidential care consultation provided by master's level clinicians who can help with decision-making support, crisis assistance and education on issues families face every day  Help in a  caller's preferred language using our translation service that features more than 200 languages and dialects  Referrals to local community programs, services and ongoing  support     FALL PRECAUTIONS: Be cautious when walking. Scan the area for obstacles that may increase the risk of trips and falls. When getting up in the mornings, sit up at the edge of the bed for a few minutes before getting out of bed. Consider elevating the bed at the head end to avoid drop of blood pressure when getting up. Walk always in a well-lit room (use night lights in the walls). Avoid area rugs or power cords from appliances in the middle of the walkways. Use a walker or a cane if necessary and consider physical therapy for balance exercise. Get your eyesight checked regularly.  FINANCIAL OVERSIGHT: Supervision, especially oversight when making financial decisions or transactions is also recommended.  HOME SAFETY: Consider the safety of the kitchen when operating appliances like stoves, microwave oven, and blender. Consider having supervision and share cooking responsibilities until no longer able to participate in those. Accidents with firearms and other hazards in the house should be identified and addressed as well.   ABILITY TO BE LEFT ALONE: If patient is unable to contact 911 operator, consider using LifeLine, or when the need is there, arrange for someone to stay with patients. Smoking is a fire hazard, consider supervision or cessation. Risk of wandering should be assessed by caregiver and if detected at any point, supervision and safe proof recommendations should be instituted.  MEDICATION SUPERVISION: Inability to self-administer medication needs to be constantly addressed. Implement a mechanism to ensure safe administration of the medications.   DRIVING: Regarding driving, in patients with progressive memory problems, driving will be impaired. We advise to have someone else do the driving if trouble finding directions or if minor accidents are reported. Independent driving assessment is available to determine safety of driving.   If you are interested in the driving  assessment, you can contact the following:  [employer] in [place] [phone]  [facility] [phone]  [facility] [phone] or [phone]      Mediterranean Diet A Mediterranean diet refers to food and lifestyle choices that are based on the traditions of countries located on the [employer] [place]. This way of eating has been shown to help prevent certain conditions and improve outcomes for people who have chronic diseases, like kidney disease and heart disease. What are tips for following this plan? Lifestyle  Cook and eat meals together with your family, when possible. Drink enough fluid to keep your urine clear or pale yellow. Be physically active every day. This includes: Aerobic exercise like running or swimming. Leisure activities like gardening, walking, or housework. Get 7-8 hours of sleep each night. If recommended by your health care provider, drink red wine in moderation. This means 1 glass a day for nonpregnant women and 2 glasses a day for men. A glass of wine equals 5 oz (150 mL). Reading food labels  Check the serving size of packaged foods. For foods such as rice and pasta, the serving size refers to the amount of cooked product, not dry. Check the total fat in packaged foods. Avoid foods that have saturated fat or trans fats. Check the ingredients list for added sugars, such as corn syrup. Shopping  At the grocery store, buy most of your food from the areas near the walls of the store. This includes: Fresh fruits and  vegetables (produce). Grains, beans, nuts, and seeds. Some of these may be available in unpackaged forms or large amounts (in bulk). Fresh seafood. Poultry and eggs. Low-fat dairy products. Buy whole ingredients instead of prepackaged foods. Buy fresh fruits and vegetables in-season from local farmers markets. Buy frozen fruits and vegetables in resealable bags. If  you do not have access to quality fresh seafood, buy precooked frozen shrimp or canned fish, such as tuna, salmon, or sardines. Buy small amounts of raw or cooked vegetables, salads, or olives from the deli or salad bar at your store. Stock your pantry so you always have certain foods on hand, such as olive oil, canned tuna, canned tomatoes, rice, pasta, and beans. Cooking  Cook foods with extra-virgin olive oil instead of using butter or other vegetable oils. Have meat as a side dish, and have vegetables or grains as your main dish. This means having meat in small portions or adding small amounts of meat to foods like pasta or stew. Use beans or vegetables instead of meat in common dishes like chili or lasagna. Experiment with different cooking methods. Try roasting or broiling vegetables instead of steaming or sauteing them. Add frozen vegetables to soups, stews, pasta, or rice. Add nuts or seeds for added healthy fat at each meal. You can add these to yogurt, salads, or vegetable dishes. Marinate fish or vegetables using olive oil, lemon juice, garlic, and fresh herbs. Meal planning  Plan to eat 1 vegetarian meal one day each week. Try to work up to 2 vegetarian meals, if possible. Eat seafood 2 or more times a week. Have healthy snacks readily available, such as: Vegetable sticks with hummus. Greek yogurt. Fruit and nut trail mix. Eat balanced meals throughout the week. This includes: Fruit: 2-3 servings a day Vegetables: 4-5 servings a day Low-fat dairy: 2 servings a day Fish, poultry, or lean meat: 1 serving a day Beans and legumes: 2 or more servings a week Nuts and seeds: 1-2 servings a day Whole grains: 6-8 servings a day Extra-virgin olive oil: 3-4 servings a day Limit red meat and sweets to only a few servings a month What are my food choices? Mediterranean diet Recommended Grains: Whole-grain pasta. Brown rice. Bulgar wheat. Polenta. Couscous. Whole-wheat bread. Orpah Cobb. Vegetables: Artichokes. Beets. Broccoli. Cabbage. Carrots. Eggplant. Green beans. Chard. Kale. Spinach. Onions. Leeks. Peas. Squash. Tomatoes. Peppers. Radishes. Fruits: Apples. Apricots. Avocado. Berries. Bananas. Cherries. Dates. Figs. Grapes. Lemons. Melon. Oranges. Peaches. Plums. Pomegranate. Meats and other protein foods: Beans. Almonds. Sunflower seeds. Pine nuts. Peanuts. Cod. Salmon. Scallops. Shrimp. Tuna. Tilapia. Clams. Oysters. Eggs. Dairy: Low-fat milk. Cheese. Greek yogurt. Beverages: Water. Red wine. Herbal tea. Fats and oils: Extra virgin olive oil. Avocado oil. Grape seed oil. Sweets and desserts: [place] yogurt with honey. Baked apples. Poached pears. Trail mix. Seasoning and other foods: Basil. Cilantro. Coriander. Cumin. Mint. Parsley. Sage. Rosemary. Tarragon. Garlic. Oregano. Thyme. Pepper. Balsalmic vinegar. Tahini. Hummus. Tomato sauce. Olives. Mushrooms. Limit these Grains: Prepackaged pasta or rice dishes. Prepackaged cereal with added sugar. Vegetables: Deep fried potatoes (french fries). Fruits: Fruit canned in syrup. Meats and other protein foods: Beef. Pork. Lamb. Poultry with skin. Hot dogs. Tomasa Blase. Dairy: Ice cream. Sour cream. Whole milk. Beverages: Juice. Sugar-sweetened soft drinks. Beer. Liquor and spirits. Fats and oils: Butter. Canola oil. Vegetable oil. Beef fat (tallow). Lard. Sweets and desserts: Cookies. Cakes. Pies. Candy. Seasoning and other foods: Mayonnaise. Premade sauces and marinades. The items listed may not be a complete list. Talk with  your dietitian about what dietary choices are right for you. Summary The Mediterranean diet includes both food and lifestyle choices. Eat a variety of fresh fruits and vegetables, beans, nuts, seeds, and whole grains. Limit the amount of red meat and sweets that you eat. Talk with your health care provider about whether it is safe for you to drink red wine in moderation. This means 1 glass a day for  nonpregnant women and 2 glasses a day for men. A glass of wine equals 5 oz (150 mL). This information is not intended to replace advice given to you by your health care provider. Make sure you discuss any questions you have with your health care provider. Document Released: 06/17/2016 Document Revised: 07/20/2016 Document Reviewed: 06/17/2016 Elsevier Interactive Patient Education  2017 [employer].   We have sent a referral to [facility] for your MRI and they will call you directly to schedule your appointment. They are located at [address] [facility]. If you need to contact them directly please call [phone].

## 2023-08-17 NOTE — Progress Notes (Addendum)
.   Assessment/Plan:   Dementia likely due to Alzheimer's disease   Erica Carter is a very pleasant 89 y.o. RH female with a history of ulcerative colitis, GERD, B12 deficiency, DM2, CKD 3, osteoarthritis, hypertension, hyperlipidemia, depression seen today in follow up for memory loss. Patient is currently on memantine 10 mg bid, tolerating well. MMSE today is stable at 24/30. Patient is able to participate on his ADLs . She no longer drives.     Follow up in 6  months. Continue Memantine 10 mg twice daily. Side effects were discussed  Recommend good control of her cardiovascular risk factors Continue to control mood as per PCP Continue B12 supplements  Continue trazodone for sleep    Subjective:    This patient is accompanied in the office by her daughter who supplements the history.  Previous records as well as any outside records available were reviewed prior to todays visit. Patient was last seen on 01/03/23     Any changes in memory since last visit? "About the same". Patient has some difficulty remembering recent conversations and family members names. She enjoys Economist. Watches game shows. LTM is good. repeats oneself?  Endorsed Disoriented when walking into a room?  Patient denies   Leaving objects in unusual places?  May misplace things but not in unusual places  Wandering behavior?  denies   Any personality changes since last visit?  denies   Any worsening depression?:  Denies.   Hallucinations or paranoia?  Denies.   Seizures? denies    Any sleep changes? Sleeps well with trazodone. Denies vivid dreams, REM behavior or sleepwalking   Sleep apnea?   Denies.   Any hygiene concerns? Denies.  Independent of bathing and dressing?  Endorsed  Does the patient needs help with medications?  Patient is in charge  Who is in charge of the finances?   Patient is in charge    Any changes in appetite?  denies    Patient have trouble swallowing? Denies.   Does the  patient cook? No Any headaches?   denies   Chronic back pain  denies   Ambulates with difficulty? Denies.   Recent falls or head injuries? denies     Unilateral weakness, numbness or tingling? denies   Any tremors?  Denies   Any anosmia?  Denies   Any incontinence of urine?  Endorsed, uses pads Any bowel dysfunction? Has chronic diarrhea due to UC    Daughter lives with her  Does the patient drive? No longer drives   Initial evaluation 08/26/2022 How long did patient have memory difficulties?  Patient reports that about 1 year ago, she began the names of her children, grandchildren, and great-grandchildren.  She began to write at least "otherwise I will forget ".  She reports that her long-term memory is fairly good. repeats oneself? endorsed Disoriented when walking into a room?  Patient denies but her daughter reports that she wonders many times why she came to the room for.  Leaving objects in unusual places?  Patient denies leaving any unusual places, but she does misplace objects, rather small objects.  Patient lives her daughter lives with her 3 days a week, because she lives out of town.  Otherwise, she has another great granddaughter who stays with her when not at school.   Ambulates  with difficulty?   Patient denies   Recent falls?  Patient denies   Any head injuries?  Patient denies   History of seizures?  Patient denies   Wandering behavior?  Patient denies   Patient drives?   Daughter got the visit WhatsApp application "360 "in case that she gets lost.  There were a couple of episodes in which she may have a felt disoriented during driving. Any mood changes ?  denies  Any depression? Denies  Hallucinations?  Patient denies   Paranoia?  Patient denies   Patient reports that she does not  sleep  well  over the last 2 years, without vivid dreams, with REM behavior (shadow boxing), denies sleepwalking   .  He just began taking trazodone. History of sleep apnea?  Patient denies    Any hygiene concerns?  Patient denies   Independent of bathing and dressing?  Endorsed  Does the patient needs help with medications?  Patient is in charge Who is in charge of the finances? Patient is in charge, although her daughter reports that she has fallen victim of a scam recently and she is about to start taking charge of the finances. Any changes in appetite?  Patient denies  Patient have trouble swallowing? Patient denies   Does the patient cook?  Not much  Any kitchen accidents such as leaving the stove on? Patient denies   Any headaches?  Patient denies   Double vision? Patient denies   Any focal numbness or tingling?  Patient denies   Chronic back pain Patient denies   Unilateral weakness?  Patient denies   Any tremors?  Patient denies   Any history of anosmia? For years, at least  since the sinus surgery 10 years ago Any incontinence of urine? Endorsed, for the last 2 year she has stress incontinence,  uses pads  Any bowel dysfunction?  She has a history of ulcerative colitis with diarrhea History of heavy alcohol intake?  Patient denies   History of heavy tobacco use?  Patient denies   Family history of dementia?   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  No acute infarct or other findings.  PREVIOUS MEDICATIONS:   CURRENT MEDICATIONS:  Outpatient Encounter Medications as of 08/17/2023  Medication Sig   amLODipine (NORVASC) 5 MG tablet Take 5 mg by mouth daily.   aspirin 81 MG tablet Take 81 mg by mouth daily.   atorvastatin (LIPITOR) 20 MG tablet Take 20 mg by  mouth daily.    calcium gluconate 500 MG tablet Take 500 mg by mouth daily.     cholecalciferol (VITAMIN D) 1000 UNITS tablet Take 1,000 Units by mouth daily.     Cyanocobalamin (VITAMIN B-12 IJ) Inject 1,000 mcg as directed every 30 (thirty) days. Pt gets shot once a month    losartan (COZAAR) 100 MG tablet Take 100 mg by mouth daily.     mesalamine (APRISO) 0.375 g 24 hr capsule TAKE 4 CAPSULES BY MOUTH ONCE DAILY .   Misc Natural Products (COLON CARE PO) Take 1 tablet by mouth daily as needed (constipation).    Multiple Vitamins-Minerals (CENTRUM SILVER PO) Take 1 tablet by mouth daily.     pantoprazole (PROTONIX) 40 MG tablet Take 1 tablet (40 mg total) by mouth  2 (two) times daily before a meal.   sertraline (ZOLOFT) 50 MG tablet SMARTSIG:1 Tablet(s) By Mouth Every Evening   vitamin B-12 (CYANOCOBALAMIN) 500 MCG tablet Take 500 mcg by mouth daily.   [DISCONTINUED] memantine (NAMENDA) 10 MG tablet Take 1 tablet  twice a day   memantine (NAMENDA) 10 MG tablet Take 1 tablet  twice a day   No facility-administered encounter medications on file as of 08/17/2023.       08/17/2023    5:00 PM  MMSE - Mini Mental State Exam  Orientation to time 5  Orientation to Place 5  Registration 3  Attention/ Calculation 3  Recall 0  Language- name 2 objects 2  Language- repeat 1  Language- follow 3 step command 3  Language- read & follow direction 1  Write a sentence 1  Copy design 0  Total score 24      08/30/2022    1:35 PM 08/26/2022   12:00 PM  Montreal Cognitive Assessment   Visuospatial/ Executive (0/5) 2 2  Naming (0/3) 0 0  Attention: Read list of digits (0/2) 2 2  Attention: Read list of letters (0/1) 1 1  Attention: Serial 7 subtraction starting at 100 (0/3) 3 3  Language: Repeat phrase (0/2) 1 1  Language : Fluency (0/1) 0 0  Abstraction (0/2) 0 0  Delayed Recall (0/5) 0 0  Orientation (0/6) 6 6  Total 15 15  Adjusted Score (based on education) 16     Objective:      PHYSICAL EXAMINATION:    VITALS:   Vitals:   08/17/23 1516  BP: 128/74  Pulse: 100  Resp: 18  SpO2: 98%  Weight: 111 lb (50.3 kg)  Height: 5' (1.524 m)    GEN:  The patient appears stated age and is in NAD. HEENT:  Normocephalic, atraumatic.   Neurological examination:  General: NAD, well-groomed, appears stated age. Orientation: The patient is alert. Oriented to person, place and date Cranial nerves: There is good facial symmetry.The speech is fluent and clear. No aphasia or dysarthria. Fund of knowledge is appropriate. Recent and remote memory are impaired. Attention and concentration are reduced.  Able to name objects and repeat phrases.  Hearing is intact to conversational tone.  Sensation: Sensation is intact to light touch throughout Motor: Strength is at least antigravity x4. DTR's 2/4 in UE/LE     Movement examination: Tone: There is normal tone in the UE/LE Abnormal movements:  no tremor.  No myoclonus.  No asterixis.   Coordination:  There is no decremation with RAM's. Normal finger to nose  Gait and Station: The patient has no difficulty arising out of a deep-seated chair without the use of the hands. The patient's stride length is good.  Gait is cautious and narrow.    Thank you for allowing [place] the opportunity to participate in the care of this nice patient. Please do not hesitate to contact us for any questions or concerns.   Total time spent on today's visit was 28 minutes dedicated to this patient today, preparing to see patient, examining the patient, ordering tests and/or medications and counseling the patient, documenting clinical information in the EHR or other health record, independently interpreting results and communicating results to the patient/family, discussing treatment and goals, answering patient's questions and coordinating care.  Cc:  Lupita Raider, MD  Marlowe Kays 08/17/2023 5:27 PM

## 2023-11-17 DIAGNOSIS — C4359 Malignant melanoma of other part of trunk: Secondary | ICD-10-CM | POA: Diagnosis not present

## 2023-11-18 DIAGNOSIS — Z85820 Personal history of malignant melanoma of skin: Secondary | ICD-10-CM | POA: Diagnosis not present

## 2023-11-18 DIAGNOSIS — K219 Gastro-esophageal reflux disease without esophagitis: Secondary | ICD-10-CM | POA: Diagnosis not present

## 2023-11-18 DIAGNOSIS — R29818 Other symptoms and signs involving the nervous system: Secondary | ICD-10-CM | POA: Diagnosis not present

## 2023-11-18 DIAGNOSIS — R2990 Unspecified symptoms and signs involving the nervous system: Secondary | ICD-10-CM | POA: Diagnosis not present

## 2023-11-18 DIAGNOSIS — Z885 Allergy status to narcotic agent status: Secondary | ICD-10-CM | POA: Diagnosis not present

## 2023-11-18 DIAGNOSIS — Z8673 Personal history of transient ischemic attack (TIA), and cerebral infarction without residual deficits: Secondary | ICD-10-CM | POA: Diagnosis not present

## 2023-11-18 DIAGNOSIS — J986 Disorders of diaphragm: Secondary | ICD-10-CM | POA: Diagnosis not present

## 2023-11-18 DIAGNOSIS — Z5309 Procedure and treatment not carried out because of other contraindication: Secondary | ICD-10-CM | POA: Diagnosis not present

## 2023-11-18 DIAGNOSIS — Z87891 Personal history of nicotine dependence: Secondary | ICD-10-CM | POA: Diagnosis not present

## 2023-11-18 DIAGNOSIS — C4359 Malignant melanoma of other part of trunk: Secondary | ICD-10-CM | POA: Diagnosis not present

## 2023-11-18 DIAGNOSIS — I6503 Occlusion and stenosis of bilateral vertebral arteries: Secondary | ICD-10-CM | POA: Diagnosis not present

## 2023-11-18 DIAGNOSIS — Z7982 Long term (current) use of aspirin: Secondary | ICD-10-CM | POA: Diagnosis not present

## 2023-11-18 DIAGNOSIS — N183 Chronic kidney disease, stage 3 unspecified: Secondary | ICD-10-CM | POA: Diagnosis not present

## 2023-11-18 DIAGNOSIS — E041 Nontoxic single thyroid nodule: Secondary | ICD-10-CM | POA: Diagnosis not present

## 2023-11-18 DIAGNOSIS — E1122 Type 2 diabetes mellitus with diabetic chronic kidney disease: Secondary | ICD-10-CM | POA: Diagnosis not present

## 2023-11-18 DIAGNOSIS — I129 Hypertensive chronic kidney disease with stage 1 through stage 4 chronic kidney disease, or unspecified chronic kidney disease: Secondary | ICD-10-CM | POA: Diagnosis not present

## 2023-11-18 DIAGNOSIS — R918 Other nonspecific abnormal finding of lung field: Secondary | ICD-10-CM | POA: Diagnosis not present

## 2023-11-18 DIAGNOSIS — Z79899 Other long term (current) drug therapy: Secondary | ICD-10-CM | POA: Diagnosis not present

## 2023-11-18 DIAGNOSIS — I671 Cerebral aneurysm, nonruptured: Secondary | ICD-10-CM | POA: Diagnosis not present

## 2023-11-29 DIAGNOSIS — E538 Deficiency of other specified B group vitamins: Secondary | ICD-10-CM | POA: Diagnosis not present

## 2023-12-01 DIAGNOSIS — I671 Cerebral aneurysm, nonruptured: Secondary | ICD-10-CM | POA: Diagnosis not present

## 2023-12-05 ENCOUNTER — Telehealth: Payer: Self-pay

## 2023-12-05 NOTE — Telephone Encounter (Signed)
Questions have been answered and PA has been submitted.

## 2023-12-05 NOTE — Telephone Encounter (Signed)
Pharmacy Patient Advocate Encounter   Received notification from CoverMyMeds that prior authorization for Mesalamine ER 0.375GM er capsules is required/requested.   Insurance verification completed.   The patient is insured through [facility] .   Per test claim: PA required; PA started via CoverMyMeds. KEY [id number] . Waiting for clinical questions to populate.

## 2023-12-06 ENCOUNTER — Other Ambulatory Visit (HOSPITAL_COMMUNITY): Payer: Self-pay

## 2023-12-06 MED ORDER — MESALAMINE ER 0.375 G PO CP24: ORAL_CAPSULE | ORAL | 3 refills | Status: DC

## 2023-12-06 NOTE — Telephone Encounter (Signed)
Pharmacy Patient Advocate Encounter  Received notification from [facility] that Prior Authorization for MESALAMINE 0.375MG  has been CANCELLED due to    PA #/Case ID/Reference #: [id number]  Pharmacy Patient Advocate Encounter  Insurance verification completed.   The patient is insured through [facility]   Ran test claim for APRISO 0.375MG . Currently a quantity of 360 is a 90day supply and the co-pay is $508 . The current 90 day co-pay is, $508.  No PA needed at this time.  This test claim was processed through [facility]- copay amounts may vary at other pharmacies due to pharmacy/plan contracts, or as the patient moves through the different stages of their insurance plan.

## 2023-12-06 NOTE — Telephone Encounter (Signed)
Rx sent for name brand Apriso.

## 2023-12-07 NOTE — Telephone Encounter (Signed)
Patients daughter called stated the Mesalamine is too expensive seeking an alternative.

## 2023-12-08 MED ORDER — MESALAMINE 1.2 G PO TBEC: 2.4000 g | DELAYED_RELEASE_TABLET | Freq: Every day | ORAL | 3 refills | Status: DC

## 2023-12-08 NOTE — Telephone Encounter (Signed)
Dr Rhea Belton-  Rx for Judd Lien is $500! Should we try to place her back on Lialda?

## 2023-12-08 NOTE — Telephone Encounter (Signed)
Discussed updated Rx with patient's daughter and advised to let us know if Theodosia Blender is still too cost prohibitive.

## 2023-12-08 NOTE — Telephone Encounter (Signed)
Yes, 2.4 g daily lialda should be fine

## 2023-12-08 NOTE — Addendum Note (Signed)
Addended by: Richardson Chiquito on: 12/08/2023 02:53 PM   Modules accepted: Orders

## 2023-12-08 NOTE — Telephone Encounter (Signed)
Updated Rx sent for Lialda 2.4 grams. Left message for patient daughter to call back.

## 2023-12-19 ENCOUNTER — Emergency Department (HOSPITAL_COMMUNITY)
Admission: EM | Admit: 2023-12-19 | Discharge: 2023-12-19 | Disposition: A | Discharge status: Home / Self Care | Payer: Medicare (Managed Care) | Attending: Emergency Medicine | Admitting: Emergency Medicine

## 2023-12-19 ENCOUNTER — Other Ambulatory Visit: Payer: Self-pay

## 2023-12-19 ENCOUNTER — Encounter (HOSPITAL_COMMUNITY): Payer: Self-pay | Admitting: Emergency Medicine

## 2023-12-19 DIAGNOSIS — C4490 Unspecified malignant neoplasm of skin, unspecified: Secondary | ICD-10-CM | POA: Diagnosis not present

## 2023-12-19 DIAGNOSIS — C4359 Malignant melanoma of other part of trunk: Secondary | ICD-10-CM | POA: Diagnosis not present

## 2023-12-19 DIAGNOSIS — Z7982 Long term (current) use of aspirin: Secondary | ICD-10-CM | POA: Diagnosis not present

## 2023-12-19 DIAGNOSIS — Z5189 Encounter for other specified aftercare: Secondary | ICD-10-CM | POA: Insufficient documentation

## 2023-12-19 DIAGNOSIS — R58 Hemorrhage, not elsewhere classified: Secondary | ICD-10-CM | POA: Insufficient documentation

## 2023-12-19 NOTE — ED Triage Notes (Signed)
 Patient presents with wound to center of chest from excision this morning. Patient states wound started bleeding this evening. No blood thinners. Advised by physician at [facility] to be seen at ED.

## 2023-12-19 NOTE — ED Notes (Signed)
 Chest wound redressed- bleeding controlled.

## 2023-12-19 NOTE — ED Provider Notes (Signed)
[place] EMERGENCY DEPARTMENT AT [facility] Provider Note   CSN: [id number] Arrival date & time: 12/19/23  2116     History  Chief Complaint  Patient presents with   Post-op Problem    Erica Carter is a 89 y.o. female past medical history of hyperlipidemia, GERD presenting to the emergency room with complaint of postop bleeding.  Patient had an excision of skin cancer done this morning.  She did not have any bleeding until this afternoon in which she noticed saturation of Band-Aid.  She called her derm doctor who recommended coming to emergency room.  Patient reports she thinks the bleeding has stopped.  She has not had any dizziness lightheadedness.  Denies any new injury.  Patient is not on blood thinners.  HPI     Home Medications Prior to Admission medications   Medication Sig Start Date End Date Taking? Authorizing Provider  amLODipine (NORVASC) 5 MG tablet Take 5 mg by mouth daily.    [provider]  aspirin 81 MG tablet Take 81 mg by mouth daily.    [provider]  atorvastatin (LIPITOR) 20 MG tablet Take 20 mg by mouth daily.     [provider]  calcium gluconate 500 MG tablet Take 500 mg by mouth daily.      [provider]  cholecalciferol (VITAMIN D) 1000 UNITS tablet Take 1,000 Units by mouth daily.      [provider]  Cyanocobalamin (VITAMIN B-12 IJ) Inject 1,000 mcg as directed every 30 (thirty) days. Pt gets shot once a month     [provider]  losartan (COZAAR) 100 MG tablet Take 100 mg by mouth daily.      [provider]  memantine (NAMENDA) 10 MG tablet Take 1 tablet  twice a day 08/17/23   Marcos Eke, PA-C  mesalamine (LIALDA) 1.2 g EC tablet Take 2 tablets (2.4 g total) by mouth daily with breakfast. Pharmacy- d/c rx for apriso 12/08/23   Pyrtle, Carie Caddy, MD  Misc Natural Products (COLON CARE PO) Take 1 tablet by mouth daily as needed (constipation).     [provider]  Multiple Vitamins-Minerals (CENTRUM SILVER PO) Take 1 tablet by mouth daily.      [provider]  pantoprazole (PROTONIX) 40 MG tablet Take 1 tablet (40 mg total) by mouth 2 (two) times daily before a meal. 02/08/23   Pyrtle, Carie Caddy, MD  sertraline (ZOLOFT) 50 MG tablet SMARTSIG:1 Tablet(s) By Mouth Every Evening 06/26/22   [provider]  vitamin B-12 (CYANOCOBALAMIN) 500 MCG tablet Take 500 mcg by mouth daily.    [provider]      Allergies    Codeine    Review of Systems   Review of Systems  Skin:  Positive for wound.    Physical Exam Updated Vital Signs BP (!) 160/91 (BP Location: Left Arm)   Pulse 79   Temp (!) 97.5 F (36.4 C) (Oral)   Resp 20   Ht 5' (1.524 m)   Wt 49.4 kg   SpO2 99%   BMI 21.29 kg/m  Physical Exam Vitals and nursing note reviewed.  Constitutional:      General: She is not in acute distress.    Appearance: She is not toxic-appearing.  HENT:     Head: Normocephalic and atraumatic.  Eyes:     General: No scleral icterus.    Conjunctiva/sclera: Conjunctivae normal.  Cardiovascular:     Rate and  Rhythm: Normal rate and regular rhythm.     Pulses: Normal pulses.     Heart sounds: Normal heart sounds.  Pulmonary:     Effort: Pulmonary effort is normal. No respiratory distress.     Breath sounds: Normal breath sounds.  Abdominal:     General: Abdomen is flat. Bowel sounds are normal.     Palpations: Abdomen is soft.     Tenderness: There is no abdominal tenderness.  Skin:    General: Skin is warm and dry.     Findings: No lesion.     Comments: Approximately 4 cm vertical incision over anterior chest.  Incision is clean dry and intact.  There is a small amount of dried blood surrounding incision.  No active bleeding.  No wound dehiscence.  Sutures are intact.  Neurological:     General: No focal deficit present.     Mental Status: She is alert and oriented to person, place, and time. Mental status is at baseline.      ED Results / Procedures / Treatments   Labs (all labs ordered are listed, but only abnormal results are displayed) Labs Reviewed - No data to display  EKG None  Radiology No results found.  Procedures Procedures    Medications Ordered in ED Medications - No data to display  ED Course/ Medical Decision Making/ A&P                                 Medical Decision Making  This patient presents to the ED for concern of wound, this involves an extensive number of treatment options, and is a complaint that carries with it a high risk of complications and morbidity.  The differential diagnosis includes wound dehiscence, infection, laceration, complication   Co morbidities that complicate the patient evaluation  Given cancer   Additional history obtained:  Additional history obtained from 12/19/23     Problem List / ED Course / Critical interventions / Medication management  Patient reporting to emergency room after having excisional removal of malignant melanoma earlier today.  She noticed small amount of blood seeping in the bandage.  Upon arriving to emergency room dressing was changed.  At this time there is no active bleeding.  Blood had clotted.  There is only small amount of blood in dressing.  Patient is not on blood thinners.  Hemodynamically stable and expressing no concerns.  Physical exam shows wounds clean dry and intact with no sign of dehiscence.  No purulent drainage.  Recovered wound and encouraged follow-up with dermatology calling office tomorrow.  Patient and family member in room agreed to plan and expressing standing.  I have reviewed the patients home medicines and have made adjustments as needed   Plan  Call derm office Tuesday Apply pressure if bleeding returns Return precautions given.  Stable for discharge          Final Clinical Impression(s) / ED Diagnoses Final diagnoses:  Visit for wound check    Rx / DC Orders ED  Discharge Orders     None         Reinaldo Raddle 12/20/23 1220    Terald Sleeper, MD 12/20/23 1419

## 2023-12-19 NOTE — Discharge Instructions (Signed)
 Please call your dermatologist in regards to your postop bleeding.  Everything looks good today.  Please keep incision clean dry and covered.  If you have any more bleeding please apply direct pressure to the wound for 5 minutes.  Return to emergency room if you have any new or worsening symptoms

## 2023-12-21 DIAGNOSIS — I729 Aneurysm of unspecified site: Secondary | ICD-10-CM | POA: Diagnosis not present

## 2024-01-04 DIAGNOSIS — Z7689 Persons encountering health services in other specified circumstances: Secondary | ICD-10-CM | POA: Diagnosis not present

## 2024-01-04 DIAGNOSIS — I671 Cerebral aneurysm, nonruptured: Secondary | ICD-10-CM | POA: Diagnosis not present

## 2024-01-24 DIAGNOSIS — E538 Deficiency of other specified B group vitamins: Secondary | ICD-10-CM | POA: Diagnosis not present

## 2024-01-25 DIAGNOSIS — G301 Alzheimer's disease with late onset: Secondary | ICD-10-CM | POA: Diagnosis not present

## 2024-01-25 DIAGNOSIS — G4700 Insomnia, unspecified: Secondary | ICD-10-CM | POA: Diagnosis not present

## 2024-02-06 ENCOUNTER — Telehealth: Payer: Self-pay | Admitting: Internal Medicine

## 2024-02-06 ENCOUNTER — Other Ambulatory Visit (HOSPITAL_COMMUNITY): Payer: Self-pay

## 2024-02-06 MED ORDER — MESALAMINE 1.2 G PO TBEC: 2.4000 g | DELAYED_RELEASE_TABLET | Freq: Every day | ORAL | 1 refills | Status: DC

## 2024-02-06 NOTE — Telephone Encounter (Signed)
 Returned daughter's call and phone rang with no answer and no voicemail to leave a message.

## 2024-02-06 NOTE — Telephone Encounter (Signed)
 Patient daughter called and stated that for the Mesalamine her mother would still have to pay 204 dollars and is needing to know where the status of the prior authorization was at for the mesalamine. Patient daughter is requesting a call back at [phone]

## 2024-02-06 NOTE — Telephone Encounter (Signed)
 Prior authorization is not required at this time. Co-pay is $94.00

## 2024-02-06 NOTE — Telephone Encounter (Signed)
 Called patient's daughter and informed her that Lialda (mesalamine) does not require a PA and costs $94.00. Patient states she will pay for it. Informed patient's daughter if it becomes cost prohibitive in the future to contact our office. Patient and patient's daughter verbalized understanding. Sent refill to patient's pharmacy.

## 2024-02-06 NOTE — Telephone Encounter (Signed)
 Patient's daughter states her mother has been taking mesalamine (Lialda) 2.4 g daily but recently had a insurance change. Patient's daughter states now the insurance will not cover mesalamine 2.4 grams and she reports needs a PA.   Please initiate PA for Lialda (mesalamine). Patient only has 3 pills left. Thank you.

## 2024-02-07 DIAGNOSIS — D485 Neoplasm of uncertain behavior of skin: Secondary | ICD-10-CM | POA: Diagnosis not present

## 2024-02-07 DIAGNOSIS — D0462 Carcinoma in situ of skin of left upper limb, including shoulder: Secondary | ICD-10-CM | POA: Diagnosis not present

## 2024-02-07 DIAGNOSIS — L718 Other rosacea: Secondary | ICD-10-CM | POA: Diagnosis not present

## 2024-02-07 DIAGNOSIS — D692 Other nonthrombocytopenic purpura: Secondary | ICD-10-CM | POA: Diagnosis not present

## 2024-02-07 DIAGNOSIS — D1801 Hemangioma of skin and subcutaneous tissue: Secondary | ICD-10-CM | POA: Diagnosis not present

## 2024-02-07 DIAGNOSIS — Z85820 Personal history of malignant melanoma of skin: Secondary | ICD-10-CM | POA: Diagnosis not present

## 2024-02-07 DIAGNOSIS — L821 Other seborrheic keratosis: Secondary | ICD-10-CM | POA: Diagnosis not present

## 2024-02-07 DIAGNOSIS — C44319 Basal cell carcinoma of skin of other parts of face: Secondary | ICD-10-CM | POA: Diagnosis not present

## 2024-02-07 DIAGNOSIS — L570 Actinic keratosis: Secondary | ICD-10-CM | POA: Diagnosis not present

## 2024-02-07 DIAGNOSIS — Z85828 Personal history of other malignant neoplasm of skin: Secondary | ICD-10-CM | POA: Diagnosis not present

## 2024-02-14 DIAGNOSIS — H52203 Unspecified astigmatism, bilateral: Secondary | ICD-10-CM | POA: Diagnosis not present

## 2024-02-14 DIAGNOSIS — Z961 Presence of intraocular lens: Secondary | ICD-10-CM | POA: Diagnosis not present

## 2024-02-14 DIAGNOSIS — E119 Type 2 diabetes mellitus without complications: Secondary | ICD-10-CM | POA: Diagnosis not present

## 2024-02-15 ENCOUNTER — Ambulatory Visit: Payer: Medicare Other | Admitting: Physician Assistant

## 2024-02-15 ENCOUNTER — Encounter: Payer: Self-pay | Admitting: Physician Assistant

## 2024-02-15 DIAGNOSIS — Z029 Encounter for administrative examinations, unspecified: Secondary | ICD-10-CM

## 2024-02-21 ENCOUNTER — Ambulatory Visit
Admission: EM | Admit: 2024-02-21 | Discharge: 2024-02-21 | Disposition: A | Discharge status: Home / Self Care | Attending: Family Medicine | Admitting: Family Medicine

## 2024-02-21 ENCOUNTER — Other Ambulatory Visit: Payer: Self-pay

## 2024-02-21 ENCOUNTER — Encounter: Payer: Self-pay | Admitting: Emergency Medicine

## 2024-02-21 DIAGNOSIS — M25561 Pain in right knee: Secondary | ICD-10-CM | POA: Diagnosis not present

## 2024-02-21 MED ORDER — DEXAMETHASONE SODIUM PHOSPHATE 10 MG/ML IJ SOLN
10.0000 mg | Freq: Once | INTRAMUSCULAR | Status: AC
  Administered 2024-02-21: 10 mg via INTRAMUSCULAR

## 2024-02-21 NOTE — Discharge Instructions (Signed)
-  Apply ACE Wrap right knee wear daily until knee swelling and pain resolved.  Apply ice to knee as tolerated to reduce swelling.  You may take Tylenol 500 mg or 650 mg every 6 hours as needed for pain

## 2024-02-21 NOTE — ED Provider Notes (Signed)
 [name]    CSN: [id number] Arrival date & time: 02/21/24  1631      History   Chief Complaint Chief Complaint  Patient presents with   Knee Pain    HPI Erica Carter is a 89 y.o. female.    Knee Pain Here today with right knee pain started last night and has progressed throughout today.  She reports knee pain is most present in area of her MCL.  She denies any recent falls or injuries.  Wrapped in an Ace bandage at home and reports that it did help a little bit.  She is in with family today for evaluation of knee pain. Past Medical History:  Diagnosis Date   Acute gastritis without mention of hemorrhage    Dementia (HCC)    Diabetes mellitus without complication (HCC)    diet control, per pt   Diverticulosis of colon (without mention of hemorrhage)    Esophageal dysmotilities    Esophageal reflux    Hiatal hernia    Other and unspecified hyperlipidemia    Personal history of colonic polyps 09/11/2010   hyperplasic   Skin cancer    Stricture and stenosis of esophagus    Ulcerative colitis, unspecified    Unspecified essential hypertension    Wears dentures    upper and lower   Wears glasses     Patient Active Problem List   Diagnosis Date Noted   Hiatal hernia    Gastric polyp    Status post epidural steroid injection 08/04/2017   Unilateral primary osteoarthritis, left hip 06/17/2017   History of gastroesophageal reflux (GERD) 10/26/2011   GASTRITIS, ACUTE W/O HEMORRHAGE 09/11/2010   HYPERLIPIDEMIA 02/12/2008   Essential hypertension 02/12/2008   GERD 02/12/2008   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   HIATAL HERNIA 05/17/2006   COLITIS, ULCERATIVE 05/17/2006   DIVERTICULOSIS, COLON 05/17/2006    Past Surgical History:  Procedure Laterality Date   BRAVO PH STUDY N/A 09/07/2019   Procedure: BRAVO PH STUDY;  Surgeon: Annis Kinder, DO;  Location: [facility];  Service: Gastroenterology;  Laterality: N/A;   BUNIONECTOMY     Bil   CATARACT  EXTRACTION Bilateral    ESOPHAGEAL MANOMETRY N/A 08/08/2019   Procedure: ESOPHAGEAL MANOMETRY (EM);  Surgeon: Sergio Dandy, MD;  Location: [facility];  Service: Endoscopy;  Laterality: N/A;   ESOPHAGOGASTRODUODENOSCOPY (EGD) WITH PROPOFOL N/A 09/07/2019   Procedure: ESOPHAGOGASTRODUODENOSCOPY (EGD) WITH PROPOFOL;  Surgeon: Annis Kinder, DO;  Location: [facility];  Service: Gastroenterology;  Laterality: N/A;   HEMOSTASIS CLIP PLACEMENT  09/07/2019   Procedure: HEMOSTASIS CLIP PLACEMENT;  Surgeon: Annis Kinder, DO;  Location: [facility];  Service: Gastroenterology;;   NASAL SINUS SURGERY     POLYPECTOMY  09/07/2019   Procedure: POLYPECTOMY;  Surgeon: Annis Kinder, DO;  Location: [facility];  Service: Gastroenterology;;   THUMB ARTHROSCOPY Left    TUBAL LIGATION      OB History   No obstetric history on file.      Home Medications    Prior to Admission medications   Medication Sig Start Date End Date Taking? Authorizing Provider  amLODipine (NORVASC) 5 MG tablet Take 5 mg by mouth daily.    [provider]  aspirin 81 MG tablet Take 81 mg by mouth daily.    [provider]  atorvastatin (LIPITOR) 20 MG tablet Take 20 mg by mouth daily.     [provider]  calcium gluconate 500 MG tablet Take 500 mg by  mouth daily.      [provider]  cholecalciferol (VITAMIN D) 1000 UNITS tablet Take 1,000 Units by mouth daily.      [provider]  Cyanocobalamin (VITAMIN B-12 IJ) Inject 1,000 mcg as directed every 30 (thirty) days. Pt gets shot once a month     [provider]  losartan (COZAAR) 100 MG tablet Take 100 mg by mouth daily.      [provider]  memantine (NAMENDA) 10 MG tablet Take 1 tablet  twice a day 08/17/23   Marcos Eke, PA-C  mesalamine (LIALDA) 1.2 g EC tablet Take 2 tablets (2.4 g total) by mouth daily with breakfast. Pharmacy- d/c rx for apriso 02/06/24   Pyrtle, Carie Caddy, MD  Misc  Natural Products (COLON CARE PO) Take 1 tablet by mouth daily as needed (constipation).     [provider]  Multiple Vitamins-Minerals (CENTRUM SILVER PO) Take 1 tablet by mouth daily.      [provider]  pantoprazole (PROTONIX) 40 MG tablet Take 1 tablet (40 mg total) by mouth 2 (two) times daily before a meal. 02/08/23   Pyrtle, Carie Caddy, MD  sertraline (ZOLOFT) 50 MG tablet SMARTSIG:1 Tablet(s) By Mouth Every Evening 06/26/22   [provider]  vitamin B-12 (CYANOCOBALAMIN) 500 MCG tablet Take 500 mcg by mouth daily.    [provider]    Family History Family History  Problem Relation Age of Onset   Thyroid cancer Brother        mets to brain   Heart disease Brother    Diabetes Mother    Diabetes Sister    Heart disease Father    Heart disease Sister    Heart disease Brother    Heart disease Brother    Heart disease Brother    Colon cancer Neg Hx    Inflammatory bowel disease Neg Hx    Liver disease Neg Hx    Stomach cancer Neg Hx    Pancreatic cancer Neg Hx     Social History Social History   Tobacco Use   Smoking status: Former   Smokeless tobacco: Never   Tobacco comments:    stopped over 20 years  Vaping Use   Vaping status: Never Used  Substance Use Topics   Alcohol use: No   Drug use: No     Allergies   Codeine   Review of Systems Review of Systems Pertinent negatives listed in HPI  Physical Exam Triage Vital Signs ED Triage Vitals [02/21/24 1755]  Encounter Vitals Group     BP (!) 141/73     Systolic BP Percentile      Diastolic BP Percentile      Pulse Rate 81     Resp 18     Temp (!) 97.2 F (36.2 C)     Temp Source Oral     SpO2 97 %     Weight      Height      Head Circumference      Peak Flow      Pain Score 4     Pain Loc      Pain Education      Exclude from Growth Chart    No data found.  Updated Vital Signs BP (!) 141/73 (BP Location: Left Arm)   Pulse 81   Temp (!) 97.2 F (36.2 C)  (Oral)   Resp 18   SpO2 97%   Visual Acuity Right Eye Distance:  Left Eye Distance:   Bilateral Distance:    Right Eye Near:   Left Eye Near:    Bilateral Near:     Physical Exam Vitals reviewed.  Constitutional:      Appearance: Normal appearance.  HENT:     Head: Normocephalic and atraumatic.     Nose: Nose normal.  Eyes:     Extraocular Movements: Extraocular movements intact.     Pupils: Pupils are equal, round, and reactive to light.  Cardiovascular:     Rate and Rhythm: Normal rate.  Pulmonary:     Effort: Pulmonary effort is normal.  Musculoskeletal:     Cervical back: Normal range of motion and neck supple.     Right knee: Normal range of motion. Tenderness present over the MCL.     Left knee: Normal.  Skin:    General: Skin is warm and dry.  Neurological:     General: No focal deficit present.     Mental Status: She is alert.  Psychiatric:        Behavior: Behavior is cooperative.      UC Treatments / Results  Labs (all labs ordered are listed, but only abnormal results are displayed) Labs Reviewed - No data to display  EKG   Radiology No results found.  Procedures Procedures (including critical care time)  Medications Ordered in UC Medications  dexamethasone (DECADRON) injection 10 mg (has no administration in time range)    Initial Impression / Assessment and Plan / UC Course  I have reviewed the triage vital signs and the nursing notes.  Pertinent labs & imaging results that were available during my care of the patient were reviewed by me and considered in my medical decision making (see chart for details).   -Apply ACE Wrap right knee wear daily until knee swelling and pain resolved. -ICE PRN as tolerated -Decadron 10 mg IM inflammation in knee that is causing swelling and pain. Precautions given if symptoms do not improve with prescribed treatment. Final Clinical Impressions(s) / UC Diagnoses   Final diagnoses:  Acute pain of  right knee     Discharge Instructions       -Apply ACE Wrap right knee wear daily until knee swelling and pain resolved.  Apply ice to knee as tolerated to reduce swelling.  You may take Tylenol 500 mg or 650 mg every 6 hours as needed for pain     ED Prescriptions   None    PDMP not reviewed this encounter.   Buena Carmine, NP 02/21/24 1904

## 2024-02-21 NOTE — ED Triage Notes (Signed)
 Pt sts right knee pain with possible swelling to inside starting this afternoon; pt denies injury or fall

## 2024-02-27 DIAGNOSIS — E538 Deficiency of other specified B group vitamins: Secondary | ICD-10-CM | POA: Diagnosis not present

## 2024-03-02 DIAGNOSIS — G309 Alzheimer's disease, unspecified: Secondary | ICD-10-CM | POA: Diagnosis not present

## 2024-03-02 DIAGNOSIS — N183 Chronic kidney disease, stage 3 unspecified: Secondary | ICD-10-CM | POA: Diagnosis not present

## 2024-03-02 DIAGNOSIS — E1122 Type 2 diabetes mellitus with diabetic chronic kidney disease: Secondary | ICD-10-CM | POA: Diagnosis not present

## 2024-03-02 DIAGNOSIS — E782 Mixed hyperlipidemia: Secondary | ICD-10-CM | POA: Diagnosis not present

## 2024-03-02 DIAGNOSIS — I129 Hypertensive chronic kidney disease with stage 1 through stage 4 chronic kidney disease, or unspecified chronic kidney disease: Secondary | ICD-10-CM | POA: Diagnosis not present

## 2024-03-02 DIAGNOSIS — J988 Other specified respiratory disorders: Secondary | ICD-10-CM | POA: Diagnosis not present

## 2024-03-26 ENCOUNTER — Ambulatory Visit: Admitting: Physician Assistant

## 2024-03-26 ENCOUNTER — Encounter: Payer: Self-pay | Admitting: Physician Assistant

## 2024-03-26 VITALS — BP 95/55 | HR 72 | Resp 20 | Ht 61.0 in | Wt 105.0 lb

## 2024-03-26 DIAGNOSIS — G309 Alzheimer's disease, unspecified: Secondary | ICD-10-CM | POA: Diagnosis not present

## 2024-03-26 DIAGNOSIS — F0280 Dementia in other diseases classified elsewhere without behavioral disturbance: Secondary | ICD-10-CM

## 2024-03-26 DIAGNOSIS — E538 Deficiency of other specified B group vitamins: Secondary | ICD-10-CM | POA: Diagnosis not present

## 2024-03-26 MED ORDER — MEMANTINE HCL 10 MG PO TABS: ORAL_TABLET | ORAL | 3 refills | Status: DC

## 2024-03-26 NOTE — Patient Instructions (Addendum)
 It was a pleasure to see you today at our office.   Recommendations:  Follow up in 6 months  Memantine  10 mg  twice daily.  Continue the other medications as per primary doctor Continue B12 supplements   Whom to call:  Memory  decline, memory medications: Call our office [phone]   For psychiatric meds, mood meds: Please have your primary care physician manage these medications.   Counseling regarding caregiver distress, including caregiver depression, anxiety and issues regarding community resources, adult day care programs, adult living facilities, or memory care questions:   Feel free to contact Misty Court Distance, Social Worker at [phone]   For assessment of decision of mental capacity and competency:  Call Dr. Laverne Potter, geriatric psychiatrist at [phone]  For guidance in geriatric dementia issues please call [facility] [phone]     If you have any severe symptoms of a stroke, or other severe issues such as confusion,severe chills or fever, etc call 911 or go to the ER as you may need to be evaluated further   Feel free to visit Facebook page " [employer]" for tips of how to care for people with memory problems.       RECOMMENDATIONS FOR ALL PATIENTS WITH MEMORY PROBLEMS: 1. Continue to exercise (Recommend 30 minutes of walking everyday, or 3 hours every week) 2. Increase social interactions - continue going to [place] and enjoy social gatherings with friends and family 3. Eat healthy, avoid fried foods and eat more fruits and vegetables 4. Maintain adequate blood pressure, blood sugar, and blood cholesterol level. Reducing the risk of stroke and cardiovascular disease also helps promoting better memory. 5. Avoid stressful situations. Live a simple life and avoid aggravations. Organize your time and prepare for the next day in anticipation. 6. Sleep well, avoid any interruptions of sleep and avoid any distractions in the bedroom that may  interfere with adequate sleep quality 7. Avoid sugar, avoid sweets as there is a strong link between excessive sugar intake, diabetes, and cognitive impairment We discussed the Mediterranean diet, which has been shown to help patients reduce the risk of progressive memory disorders and reduces cardiovascular risk. This includes eating fish, eat fruits and green leafy vegetables, nuts like almonds and hazelnuts, walnuts, and also use olive oil. Avoid fast foods and fried foods as much as possible. Avoid sweets and sugar as sugar use has been linked to worsening of memory function.  There is always a concern of gradual progression of memory problems. If this is the case, then we may need to adjust level of care according to patient needs. Support, both to the patient and caregiver, should then be put into place.    The Alzheimer's Association is here all day, every day for people facing Alzheimer's disease through our free 24/7 Helpline: [phone])[phone]. The Helpline provides reliable information and support to all those who need assistance, such as individuals living with memory loss, Alzheimer's or other dementia, caregivers, health care professionals and the public.  Our highly trained and knowledgeable staff can help you with: Understanding memory loss, dementia and Alzheimer's  Medications and other treatment options  General information about aging and brain health  Skills to provide quality care and to find the best care from professionals  Legal, financial and living-arrangement decisions Our Helpline also features: Confidential care consultation provided by master's level clinicians who can help with decision-making support, crisis assistance and education on issues families face every day  Help in a caller's preferred language  using our translation service that features more than 200 languages and dialects  Referrals to local community programs, services and ongoing support     FALL  PRECAUTIONS: Be cautious when walking. Scan the area for obstacles that may increase the risk of trips and falls. When getting up in the mornings, sit up at the edge of the bed for a few minutes before getting out of bed. Consider elevating the bed at the head end to avoid drop of blood pressure when getting up. Walk always in a well-lit room (use night lights in the walls). Avoid area rugs or power cords from appliances in the middle of the walkways. Use a walker or a cane if necessary and consider physical therapy for balance exercise. Get your eyesight checked regularly.  FINANCIAL OVERSIGHT: Supervision, especially oversight when making financial decisions or transactions is also recommended.  HOME SAFETY: Consider the safety of the kitchen when operating appliances like stoves, microwave oven, and blender. Consider having supervision and share cooking responsibilities until no longer able to participate in those. Accidents with firearms and other hazards in the house should be identified and addressed as well.   ABILITY TO BE LEFT ALONE: If patient is unable to contact 911 operator, consider using LifeLine, or when the need is there, arrange for someone to stay with patients. Smoking is a fire hazard, consider supervision or cessation. Risk of wandering should be assessed by caregiver and if detected at any point, supervision and safe proof recommendations should be instituted.  MEDICATION SUPERVISION: Inability to self-administer medication needs to be constantly addressed. Implement a mechanism to ensure safe administration of the medications.   DRIVING: Regarding driving, in patients with progressive memory problems, driving will be impaired. We advise to have someone else do the driving if trouble finding directions or if minor accidents are reported. Independent driving assessment is available to determine safety of driving.   If you are interested in the driving assessment, you can contact  the following:  [employer] in [place] [phone]  [facility] [phone]  [facility] [phone] or [phone]      Mediterranean Diet A Mediterranean diet refers to food and lifestyle choices that are based on the traditions of countries located on the [employer]. This way of eating has been shown to help prevent certain conditions and improve outcomes for people who have chronic diseases, like kidney disease and heart disease. What are tips for following this plan? Lifestyle  Cook and eat meals together with your family, when possible. Drink enough fluid to keep your urine clear or pale yellow. Be physically active every day. This includes: Aerobic exercise like running or swimming. Leisure activities like gardening, walking, or housework. Get 7-8 hours of sleep each night. If recommended by your health care provider, drink red wine in moderation. This means 1 glass a day for nonpregnant women and 2 glasses a day for men. A glass of wine equals 5 oz (150 mL). Reading food labels  Check the serving size of packaged foods. For foods such as rice and pasta, the serving size refers to the amount of cooked product, not dry. Check the total fat in packaged foods. Avoid foods that have saturated fat or trans fats. Check the ingredients list for added sugars, such as corn syrup. Shopping  At the grocery store, buy most of your food from the areas near the walls of the store. This includes: Fresh fruits and vegetables (produce). Grains,  beans, nuts, and seeds. Some of these may be available in unpackaged forms or large amounts (in bulk). Fresh seafood. Poultry and eggs. Low-fat dairy products. Buy whole ingredients instead of prepackaged foods. Buy fresh fruits and vegetables in-season from local farmers markets. Buy frozen fruits and vegetables in resealable bags. If you do not have access to  quality fresh seafood, buy precooked frozen shrimp or canned fish, such as tuna, salmon, or sardines. Buy small amounts of raw or cooked vegetables, salads, or olives from the deli or salad bar at your store. Stock your pantry so you always have certain foods on hand, such as olive oil, canned tuna, canned tomatoes, rice, pasta, and beans. Cooking  Cook foods with extra-virgin olive oil instead of using butter or other vegetable oils. Have meat as a side dish, and have vegetables or grains as your main dish. This means having meat in small portions or adding small amounts of meat to foods like pasta or stew. Use beans or vegetables instead of meat in common dishes like chili or lasagna. Experiment with different cooking methods. Try roasting or broiling vegetables instead of steaming or sauteing them. Add frozen vegetables to soups, stews, pasta, or rice. Add nuts or seeds for added healthy fat at each meal. You can add these to yogurt, salads, or vegetable dishes. Marinate fish or vegetables using olive oil, lemon juice, garlic, and fresh herbs. Meal planning  Plan to eat 1 vegetarian meal one day each week. Try to work up to 2 vegetarian meals, if possible. Eat seafood 2 or more times a week. Have healthy snacks readily available, such as: Vegetable sticks with hummus. Greek yogurt. Fruit and nut trail mix. Eat balanced meals throughout the week. This includes: Fruit: 2-3 servings a day Vegetables: 4-5 servings a day Low-fat dairy: 2 servings a day Fish, poultry, or lean meat: 1 serving a day Beans and legumes: 2 or more servings a week Nuts and seeds: 1-2 servings a day Whole grains: 6-8 servings a day Extra-virgin olive oil: 3-4 servings a day Limit red meat and sweets to only a few servings a month What are my food choices? Mediterranean diet Recommended Grains: Whole-grain pasta. Brown rice. Bulgar wheat. Polenta. Couscous. Whole-wheat bread. Dwyane Glad. Vegetables:  Artichokes. Beets. Broccoli. Cabbage. Carrots. Eggplant. Green beans. Chard. Kale. Spinach. Onions. Leeks. Peas. Squash. Tomatoes. Peppers. Radishes. Fruits: Apples. Apricots. Avocado. Berries. Bananas. Cherries. Dates. Figs. Grapes. Lemons. Melon. Oranges. Peaches. Plums. Pomegranate. Meats and other protein foods: Beans. Almonds. Sunflower seeds. Pine nuts. Peanuts. Cod. Salmon. Scallops. Shrimp. Tuna. Tilapia. Clams. Oysters. Eggs. Dairy: Low-fat milk. Cheese. Greek yogurt. Beverages: Water. Red wine. Herbal tea. Fats and oils: Extra virgin olive oil. Avocado oil. Grape seed oil. Sweets and desserts: [place] yogurt with honey. Baked apples. Poached pears. Trail mix. Seasoning and other foods: Basil. Cilantro. Coriander. Cumin. Mint. Parsley. Sage. Rosemary. Tarragon. Garlic. Oregano. Thyme. Pepper. Balsalmic vinegar. Tahini. Hummus. Tomato sauce. Olives. Mushrooms. Limit these Grains: Prepackaged pasta or rice dishes. Prepackaged cereal with added sugar. Vegetables: Deep fried potatoes (french fries). Fruits: Fruit canned in syrup. Meats and other protein foods: Beef. Pork. Lamb. Poultry with skin. Hot dogs. Helene Loader. Dairy: Ice cream. Sour cream. Whole milk. Beverages: Juice. Sugar-sweetened soft drinks. Beer. Liquor and spirits. Fats and oils: Butter. Canola oil. Vegetable oil. Beef fat (tallow). Lard. Sweets and desserts: Cookies. Cakes. Pies. Candy. Seasoning and other foods: Mayonnaise. Premade sauces and marinades. The items listed may not be a complete list. Talk with your dietitian about  what dietary choices are right for you. Summary The Mediterranean diet includes both food and lifestyle choices. Eat a variety of fresh fruits and vegetables, beans, nuts, seeds, and whole grains. Limit the amount of red meat and sweets that you eat. Talk with your health care provider about whether it is safe for you to drink red wine in moderation. This means 1 glass a day for nonpregnant women and 2  glasses a day for men. A glass of wine equals 5 oz (150 mL). This information is not intended to replace advice given to you by your health care provider. Make sure you discuss any questions you have with your health care provider. Document Released: 06/17/2016 Document Revised: 07/20/2016 Document Reviewed: 06/17/2016 Elsevier Interactive Patient Education  2017 [employer].   We have sent a referral to [facility] for your MRI and they will call you directly to schedule your appointment. They are located at [address] [facility]. If you need to contact them directly please call [phone].

## 2024-03-26 NOTE — Progress Notes (Signed)
 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 RH female with a history of ulcerative colitis, GERD, B12 deficiency, DM2, CKD 3, osteoarthritis, hypertension, hyperlipidemia, depression, history of anterior communicating artery aneurysm followed at [facility], stage Ib malignant melanoma of the left chest s/p resection and the diagnosis of dementia due to Alzheimer disease, seen today in follow up for memory loss. Patient is currently on memantine  10 mg twice daily, tolerating well . MMSE today is stable at 24/30. Patient is able to participate on ADLs, no longer drives. Mood is good.       Follow up in 6  months. Continue memantine  10 mg twice daily, side effects discussed  Continue B12 supplements Continue trazodone for sleep Recommend good control of her cardiovascular risk factors Continue to control mood as per PCP     Subjective:    This patient is accompanied in the office by her daughter who supplements the history.  Previous records as well as any outside records available were reviewed prior to todays visit. Patient was last seen on 08/17/2023 with MMSE 24/30.    Any changes in memory since last visit? "About the same".  She does have difficulty remembering recent conversations and names of family members "but not worse than before"-daughter says.  She enjoys doing crossword puzzles.  She watches TV games shows.  Long-term memory is good. repeats oneself?  Endorsed Disoriented when walking into a room? Denies    Leaving objects?  May misplace things but not in unusual places   Wandering behavior?  denies   Any personality changes since last visit?  Denies.   Any worsening depression?:  Denies.   Hallucinations or paranoia?  Denies.   Seizures? denies    Any sleep changes?  Denies vivid dreams, REM behavior or sleepwalking   Sleep apnea?   Denies.   Any hygiene concerns? Denies.  Independent of bathing and dressing?   Endorsed  Does the patient needs help with medications?  Please change is in charge, uses a pillbox   Who is in charge of the finances?  Patient is in charge .     Any changes in appetite?  Denies.    Not drinking enough water.  Patient have trouble swallowing? Denies.   Does the patient cook? No. Any headaches?   Denies.   Any vision changes? Denies  Chronic back pain  denies.   Ambulates with difficulty? Denies.    Recent falls or head injuries? Denies.     Unilateral weakness, numbness or tingling? denies   Any tremors?  Denies   Any anosmia?  Denies   Any incontinence of urine?  Endorsed. "Sometimes" Any bowel dysfunction?  Has chronic intermittent diarrhea due to UC    Daughter lives with her.  Does the patient drive? No longer drives     Initial evaluation 08/26/2022 How long did patient have memory difficulties?  Patient reports that about 1 year ago, she began the names of her children, grandchildren, and great-grandchildren.  She began to write at least "otherwise I will forget ".  She reports that her long-term memory is fairly good. repeats oneself? endorsed Disoriented when walking into a room?  Patient denies but her daughter reports that she wonders many times why she came to the room for.  Leaving objects in unusual places?  Patient denies leaving any unusual places, but she does misplace objects, rather small objects.  Patient lives her daughter lives  with her 3 days a week, because she lives out of town.  Otherwise, she has another great granddaughter who stays with her when not at school.   Ambulates  with difficulty?   Patient denies   Recent falls?  Patient denies   Any head injuries?  Patient denies   History of seizures?   Patient denies   Wandering behavior?  Patient denies   Patient drives?   Daughter got the visit WhatsApp application "360 "in case that she gets lost.  There were a couple of episodes in which she may have a felt disoriented during driving. Any  mood changes ?  denies  Any depression? Denies  Hallucinations?  Patient denies   Paranoia?  Patient denies   Patient reports that she does not  sleep  well  over the last 2 years, without vivid dreams, with REM behavior (shadow boxing), denies sleepwalking   .  He just began taking trazodone. History of sleep apnea?  Patient denies   Any hygiene concerns?  Patient denies   Independent of bathing and dressing?  Endorsed  Does the patient needs help with medications?  Patient is in charge Who is in charge of the finances? Patient is in charge, although her daughter reports that she has fallen victim of a scam recently and she is about to start taking charge of the finances. Any changes in appetite?  Patient denies  Patient have trouble swallowing? Patient denies   Does the patient cook?  Not much  Any kitchen accidents such as leaving the stove on? Patient denies   Any headaches?  Patient denies   Double vision? Patient denies   Any focal numbness or tingling?  Patient denies   Chronic back pain Patient denies   Unilateral weakness?  Patient denies   Any tremors?  Patient denies   Any history of anosmia? For years, at least  since the sinus surgery 10 years ago Any incontinence of urine? Endorsed, for the last 2 year she has stress incontinence,  uses pads  Any bowel dysfunction?  She has a history of ulcerative colitis with diarrhea History of heavy alcohol intake?  Patient denies   History of heavy tobacco use?  Patient denies   Family history of dementia?   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  No acute infarct or other findings.     PREVIOUS MEDICATIONS:   CURRENT MEDICATIONS:  Outpatient Encounter Medications as of 03/26/2024  Medication Sig   amLODipine (NORVASC) 5 MG tablet Take 5 mg by mouth daily.   aspirin 81 MG tablet Take 81 mg by mouth daily.   atorvastatin (LIPITOR) 20 MG tablet Take 20 mg by mouth daily.    calcium gluconate 500 MG tablet Take 500 mg by mouth daily.     cholecalciferol (VITAMIN D) 1000 UNITS tablet Take 1,000 Units by mouth daily.     Cyanocobalamin  (VITAMIN B-12 IJ) Inject 1,000 mcg as directed every 30 (thirty) days. Pt gets shot once a month    losartan (COZAAR) 100 MG tablet Take 100 mg by mouth daily.  mesalamine  (LIALDA ) 1.2 g EC tablet Take 2 tablets (2.4 g total) by mouth daily with breakfast. Pharmacy- d/c rx for apriso    Misc Natural Products (COLON CARE PO) Take 1 tablet by mouth daily as needed (constipation).    Multiple Vitamins-Minerals (CENTRUM SILVER PO) Take 1 tablet by mouth daily.     pantoprazole  (PROTONIX ) 40 MG tablet Take 1 tablet (40 mg total) by mouth 2 (two) times daily before a meal.   sertraline (ZOLOFT) 50 MG tablet SMARTSIG:1 Tablet(s) By Mouth Every Evening   vitamin B-12 (CYANOCOBALAMIN ) 500 MCG tablet Take 500 mcg by mouth daily.   [DISCONTINUED] memantine  (NAMENDA ) 10 MG tablet Take 1 tablet  twice a day   memantine  (NAMENDA ) 10 MG tablet Take 1 tablet  twice a day   No facility-administered encounter medications on file as of 03/26/2024.       03/26/2024    9:00 AM 08/17/2023    5:00 PM  MMSE - Mini Mental State Exam  Orientation to time 5 5  Orientation to Place 4 5  Registration 3 3  Attention/ Calculation 4 3  Recall 0 0  Language- name 2 objects 2 2  Language- repeat 1 1  Language- follow 3 step command 3 3  Language- read & follow direction 1 1  Write a sentence 1 1  Copy design 0 0  Total score 24 24      08/30/2022    1:35 PM 08/26/2022   12:00 PM  Montreal Cognitive  Assessment   Visuospatial/ Executive (0/5) 2 2  Naming (0/3) 0 0  Attention: Read list of digits (0/2) 2 2  Attention: Read list of letters (0/1) 1 1  Attention: Serial 7 subtraction starting at 100 (0/3) 3 3  Language: Repeat phrase (0/2) 1 1  Language : Fluency (0/1) 0 0  Abstraction (0/2) 0 0  Delayed Recall (0/5) 0 0  Orientation (0/6) 6 6  Total 15 15  Adjusted Score (based on education) 16     Objective:     PHYSICAL EXAMINATION:    VITALS:   Vitals:   03/26/24 0924  BP: (!) 95/55  Pulse: 72  Resp: 20  SpO2: 95%  Weight: 105 lb (47.6 kg)  Height: 5\' 1"  (1.549 m)    GEN:  The patient appears stated age and is in NAD. HEENT:  Normocephalic, atraumatic.   Neurological examination:  General: NAD, well-groomed, appears stated age. Orientation: The patient is alert. Oriented to person, place and date Cranial nerves: There is good facial symmetry.The speech is fluent and clear. No aphasia or dysarthria. Fund of knowledge is appropriate. Recent and remote memory are impaired. Attention and concentration are reduced. Able to name objects and repeat phrases.  Hearing is intact to conversational tone.   Sensation: Sensation is intact to light touch throughout Motor: Strength is at least antigravity x4. DTR's 2/4 in UE/LE     Movement examination: Tone: There is normal tone in the UE/LE Abnormal movements:  no tremor.  No myoclonus.  No asterixis.   Coordination:  There is no decremation with RAM's. Normal finger to nose  Gait and Station: The patient has no  difficulty arising out of a deep-seated chair without the use of the hands. The patient's stride length is good.  Gait is cautious and narrow.    Thank you for allowing us  the opportunity to participate in the care of this nice patient. Please do not hesitate to contact us  for any questions or concerns.  Total time spent on today's visit was 30 minutes dedicated to this patient today, preparing to see patient,  examining the patient, ordering tests and/or medications and counseling the patient, documenting clinical information in the EHR or other health record, independently interpreting results and communicating results to the patient/family, discussing treatment and goals, answering patient's questions and coordinating care.  Cc:  Glena Landau, MD  Tex Filbert 03/26/2024 12:07 PM

## 2024-04-06 ENCOUNTER — Other Ambulatory Visit (HOSPITAL_COMMUNITY): Payer: Self-pay

## 2024-04-06 ENCOUNTER — Telehealth: Payer: Self-pay

## 2024-04-06 NOTE — Telephone Encounter (Signed)
 Pharmacy Patient Advocate Encounter   Received notification from CoverMyMeds that prior authorization for Mesalamine  1.2GM dr tablets is required/requested.   Insurance verification completed.   The patient is insured through [employer] .   Per test claim: The current 30 day co-pay is, $98.01.  No PA needed at this time. This test claim was processed through [facility]- copay amounts may vary at other pharmacies due to pharmacy/plan contracts, or as the patient moves through the different stages of their insurance plan.

## 2024-04-09 ENCOUNTER — Other Ambulatory Visit: Payer: Self-pay | Admitting: Internal Medicine

## 2024-04-23 DIAGNOSIS — E538 Deficiency of other specified B group vitamins: Secondary | ICD-10-CM | POA: Diagnosis not present

## 2024-05-21 DIAGNOSIS — E538 Deficiency of other specified B group vitamins: Secondary | ICD-10-CM | POA: Diagnosis not present

## 2024-05-29 ENCOUNTER — Other Ambulatory Visit: Payer: Self-pay | Admitting: Internal Medicine

## 2024-05-31 ENCOUNTER — Other Ambulatory Visit: Payer: Self-pay | Admitting: Internal Medicine

## 2024-06-18 DIAGNOSIS — Z85820 Personal history of malignant melanoma of skin: Secondary | ICD-10-CM | POA: Diagnosis not present

## 2024-06-18 DIAGNOSIS — C4441 Basal cell carcinoma of skin of scalp and neck: Secondary | ICD-10-CM | POA: Diagnosis not present

## 2024-06-18 DIAGNOSIS — L570 Actinic keratosis: Secondary | ICD-10-CM | POA: Diagnosis not present

## 2024-06-18 DIAGNOSIS — E538 Deficiency of other specified B group vitamins: Secondary | ICD-10-CM | POA: Diagnosis not present

## 2024-06-18 DIAGNOSIS — Z85828 Personal history of other malignant neoplasm of skin: Secondary | ICD-10-CM | POA: Diagnosis not present

## 2024-06-18 DIAGNOSIS — C44319 Basal cell carcinoma of skin of other parts of face: Secondary | ICD-10-CM | POA: Diagnosis not present

## 2024-06-18 DIAGNOSIS — L821 Other seborrheic keratosis: Secondary | ICD-10-CM | POA: Diagnosis not present

## 2024-06-30 ENCOUNTER — Other Ambulatory Visit: Payer: Self-pay | Admitting: Internal Medicine

## 2024-07-02 ENCOUNTER — Other Ambulatory Visit: Payer: Self-pay | Admitting: Internal Medicine

## 2024-07-07 ENCOUNTER — Other Ambulatory Visit: Payer: Self-pay | Admitting: Internal Medicine

## 2024-07-10 ENCOUNTER — Telehealth: Payer: Self-pay | Admitting: Internal Medicine

## 2024-07-10 MED ORDER — MESALAMINE 1.2 G PO TBEC: 2.4000 g | DELAYED_RELEASE_TABLET | Freq: Every day | ORAL | 1 refills | Status: DC

## 2024-07-10 NOTE — Telephone Encounter (Signed)
 PT  is calling for an update on mesalamine . Please advise.

## 2024-07-10 NOTE — Telephone Encounter (Signed)
 Returned [employer] (daughter) call and informed her that her mother was last seen 02/2023 and she is due for a follow up visit. Melissa verbalized understanding. Patient was scheduled to see Ellouise Console, PA on 09/10/24 at 10:20 am. Prescription for mesalamine  sent to [facility] until scheduled appt.

## 2024-07-13 ENCOUNTER — Ambulatory Visit
Admission: EM | Admit: 2024-07-13 | Discharge: 2024-07-13 | Disposition: A | Discharge status: Home / Self Care | Attending: Family Medicine | Admitting: Family Medicine

## 2024-07-13 ENCOUNTER — Emergency Department (HOSPITAL_COMMUNITY)
Admission: EM | Admit: 2024-07-13 | Discharge: 2024-07-13 | Disposition: A | Discharge status: Home / Self Care | Attending: Emergency Medicine | Admitting: Emergency Medicine

## 2024-07-13 ENCOUNTER — Other Ambulatory Visit: Payer: Self-pay

## 2024-07-13 ENCOUNTER — Encounter: Payer: Self-pay | Admitting: Emergency Medicine

## 2024-07-13 ENCOUNTER — Emergency Department (HOSPITAL_COMMUNITY)

## 2024-07-13 DIAGNOSIS — Z79899 Other long term (current) drug therapy: Secondary | ICD-10-CM | POA: Diagnosis not present

## 2024-07-13 DIAGNOSIS — I499 Cardiac arrhythmia, unspecified: Secondary | ICD-10-CM | POA: Diagnosis not present

## 2024-07-13 DIAGNOSIS — M546 Pain in thoracic spine: Secondary | ICD-10-CM | POA: Diagnosis not present

## 2024-07-13 DIAGNOSIS — F0390 Unspecified dementia without behavioral disturbance: Secondary | ICD-10-CM | POA: Insufficient documentation

## 2024-07-13 DIAGNOSIS — I491 Atrial premature depolarization: Secondary | ICD-10-CM | POA: Diagnosis not present

## 2024-07-13 DIAGNOSIS — N200 Calculus of kidney: Secondary | ICD-10-CM

## 2024-07-13 DIAGNOSIS — E041 Nontoxic single thyroid nodule: Secondary | ICD-10-CM | POA: Insufficient documentation

## 2024-07-13 DIAGNOSIS — R0602 Shortness of breath: Secondary | ICD-10-CM | POA: Diagnosis not present

## 2024-07-13 DIAGNOSIS — D259 Leiomyoma of uterus, unspecified: Secondary | ICD-10-CM | POA: Diagnosis not present

## 2024-07-13 DIAGNOSIS — N201 Calculus of ureter: Secondary | ICD-10-CM | POA: Diagnosis not present

## 2024-07-13 DIAGNOSIS — R112 Nausea with vomiting, unspecified: Secondary | ICD-10-CM | POA: Diagnosis not present

## 2024-07-13 DIAGNOSIS — R911 Solitary pulmonary nodule: Secondary | ICD-10-CM | POA: Diagnosis not present

## 2024-07-13 DIAGNOSIS — N132 Hydronephrosis with renal and ureteral calculous obstruction: Secondary | ICD-10-CM | POA: Diagnosis not present

## 2024-07-13 DIAGNOSIS — K5730 Diverticulosis of large intestine without perforation or abscess without bleeding: Secondary | ICD-10-CM | POA: Diagnosis not present

## 2024-07-13 DIAGNOSIS — M549 Dorsalgia, unspecified: Secondary | ICD-10-CM | POA: Diagnosis not present

## 2024-07-13 DIAGNOSIS — R1084 Generalized abdominal pain: Secondary | ICD-10-CM | POA: Diagnosis not present

## 2024-07-13 DIAGNOSIS — K5790 Diverticulosis of intestine, part unspecified, without perforation or abscess without bleeding: Secondary | ICD-10-CM

## 2024-07-13 DIAGNOSIS — R059 Cough, unspecified: Secondary | ICD-10-CM | POA: Insufficient documentation

## 2024-07-13 DIAGNOSIS — Z7982 Long term (current) use of aspirin: Secondary | ICD-10-CM | POA: Diagnosis not present

## 2024-07-13 DIAGNOSIS — R42 Dizziness and giddiness: Secondary | ICD-10-CM | POA: Diagnosis not present

## 2024-07-13 DIAGNOSIS — D219 Benign neoplasm of connective and other soft tissue, unspecified: Secondary | ICD-10-CM

## 2024-07-13 DIAGNOSIS — I10 Essential (primary) hypertension: Secondary | ICD-10-CM | POA: Diagnosis not present

## 2024-07-13 DIAGNOSIS — I2510 Atherosclerotic heart disease of native coronary artery without angina pectoris: Secondary | ICD-10-CM | POA: Diagnosis not present

## 2024-07-13 DIAGNOSIS — N134 Hydroureter: Secondary | ICD-10-CM | POA: Diagnosis not present

## 2024-07-13 DIAGNOSIS — K5750 Diverticulosis of both small and large intestine without perforation or abscess without bleeding: Secondary | ICD-10-CM | POA: Diagnosis not present

## 2024-07-13 DIAGNOSIS — R109 Unspecified abdominal pain: Secondary | ICD-10-CM | POA: Diagnosis not present

## 2024-07-13 LAB — CBC WITH DIFFERENTIAL/PLATELET
Abs Immature Granulocytes: 0.02 K/uL (ref 0.00–0.07)
Basophils Absolute: 0 K/uL (ref 0.0–0.1)
Basophils Relative: 1 %
Eosinophils Absolute: 0.1 K/uL (ref 0.0–0.5)
Eosinophils Relative: 2 %
HCT: 44.8 % (ref 36.0–46.0)
Hemoglobin: 14.5 g/dL (ref 12.0–15.0)
Immature Granulocytes: 0 %
Lymphocytes Relative: 16 %
Lymphs Abs: 1.1 K/uL (ref 0.7–4.0)
MCH: 29.2 pg (ref 26.0–34.0)
MCHC: 32.4 g/dL (ref 30.0–36.0)
MCV: 90.1 fL (ref 80.0–100.0)
Monocytes Absolute: 0.6 K/uL (ref 0.1–1.0)
Monocytes Relative: 8 %
Neutro Abs: 5.2 K/uL (ref 1.7–7.7)
Neutrophils Relative %: 73 %
Platelets: 243 K/uL (ref 150–400)
RBC: 4.97 MIL/uL (ref 3.87–5.11)
RDW: 13.2 % (ref 11.5–15.5)
WBC: 7.1 K/uL (ref 4.0–10.5)
nRBC: 0 % (ref 0.0–0.2)

## 2024-07-13 LAB — URINALYSIS, ROUTINE W REFLEX MICROSCOPIC
Bacteria, UA: NONE SEEN
Bilirubin Urine: NEGATIVE
Glucose, UA: 150 mg/dL — AB
Ketones, ur: NEGATIVE mg/dL
Leukocytes,Ua: NEGATIVE
Nitrite: NEGATIVE
Protein, ur: NEGATIVE mg/dL
RBC / HPF: >50 RBC/hpf (ref 0–5)
Specific Gravity, Urine: 1.033 — ABNORMAL HIGH (ref 1.005–1.030)
pH: 7 (ref 5.0–8.0)

## 2024-07-13 LAB — COMPREHENSIVE METABOLIC PANEL WITH GFR
ALT: 7 U/L (ref 0–44)
AST: 37 U/L (ref 15–41)
Albumin: 3.2 g/dL — ABNORMAL LOW (ref 3.5–5.0)
Alkaline Phosphatase: 87 U/L (ref 38–126)
Anion gap: 10 (ref 5–15)
BUN: 14 mg/dL (ref 8–23)
CO2: 25 mmol/L (ref 22–32)
Calcium: 8.8 mg/dL — ABNORMAL LOW (ref 8.9–10.3)
Chloride: 110 mmol/L (ref 98–111)
Creatinine, Ser: 0.99 mg/dL (ref 0.44–1.00)
GFR, Estimated: 55 mL/min — ABNORMAL LOW (ref >60)
Glucose, Bld: 200 mg/dL — ABNORMAL HIGH (ref 70–99)
Potassium: 3.6 mmol/L (ref 3.5–5.1)
Sodium: 145 mmol/L (ref 135–145)
Total Bilirubin: 1.3 mg/dL — ABNORMAL HIGH (ref 0.0–1.2)
Total Protein: 6 g/dL — ABNORMAL LOW (ref 6.5–8.1)

## 2024-07-13 LAB — LIPASE, BLOOD: Lipase: 27 U/L (ref 11–51)

## 2024-07-13 LAB — I-STAT CHEM 8, ED
BUN: 18 mg/dL (ref 8–23)
Calcium, Ion: 1.15 mmol/L (ref 1.15–1.40)
Chloride: 108 mmol/L (ref 98–111)
Creatinine, Ser: 0.9 mg/dL (ref 0.44–1.00)
Glucose, Bld: 195 mg/dL — ABNORMAL HIGH (ref 70–99)
HCT: 43 % (ref 36.0–46.0)
Hemoglobin: 14.6 g/dL (ref 12.0–15.0)
Potassium: 3.5 mmol/L (ref 3.5–5.1)
Sodium: 146 mmol/L — ABNORMAL HIGH (ref 135–145)
TCO2: 27 mmol/L (ref 22–32)

## 2024-07-13 LAB — BRAIN NATRIURETIC PEPTIDE: B Natriuretic Peptide: 59.6 pg/mL (ref 0.0–100.0)

## 2024-07-13 LAB — TROPONIN I (HIGH SENSITIVITY): Troponin I (High Sensitivity): 7 ng/L (ref <18)

## 2024-07-13 MED ORDER — ONDANSETRON 4 MG PO TBDP: 4.0000 mg | ORAL_TABLET | Freq: Three times a day (TID) | ORAL | 0 refills | Status: AC | PRN

## 2024-07-13 MED ORDER — TAMSULOSIN HCL 0.4 MG PO CAPS: 0.4000 mg | ORAL_CAPSULE | Freq: Every day | ORAL | 0 refills | Status: DC

## 2024-07-13 MED ORDER — ONDANSETRON HCL 4 MG/2ML IJ SOLN
4.0000 mg | Freq: Once | INTRAMUSCULAR | Status: AC
  Administered 2024-07-13: 4 mg via INTRAVENOUS
  Filled 2024-07-13: qty 2

## 2024-07-13 MED ORDER — NAPROXEN 500 MG PO TABS: 500.0000 mg | ORAL_TABLET | Freq: Two times a day (BID) | ORAL | 0 refills | Status: DC

## 2024-07-13 MED ORDER — SODIUM CHLORIDE 0.9 % IV BOLUS
500.0000 mL | Freq: Once | INTRAVENOUS | Status: AC
  Administered 2024-07-13: 500 mL via INTRAVENOUS

## 2024-07-13 MED ORDER — IOHEXOL 350 MG/ML SOLN
75.0000 mL | Freq: Once | INTRAVENOUS | Status: AC | PRN
  Administered 2024-07-13: 75 mL via INTRAVENOUS

## 2024-07-13 NOTE — ED Provider Notes (Signed)
 [place] EMERGENCY DEPARTMENT AT [facility] Provider Note   CSN: [id number] Arrival date & time: 07/13/24  1218     Patient presents with: No chief complaint on file.   Erica Carter is a 89 y.o. female.  With past medical history of hyperlipidemia, hypertension, GERD, dementia is presenting to emergency room with complaint of back pain/abdominal pain and shortness of breath. Patient has dementia, history supplied by daughter and family member in room.  They report that this morning she was walking to the car and when she sat down she started complaining of abdominal and back pain, thus they brought her to urgent care.  When walking into urgent care they report that patient had labored breathing, and had several episodes of nonbilious nonbloody vomit then her symptoms resolved whilst with EMS. Family reports she has had a cough for the last 2 days, otherwise no other symptoms. Mentation at baseline. No injury or fall noted. She is currently denying symptoms.   HPI     Prior to Admission medications   Medication Sig Start Date End Date Taking? Authorizing Provider  amLODipine (NORVASC) 5 MG tablet Take 5 mg by mouth daily.    [provider]  aspirin 81 MG tablet Take 81 mg by mouth daily.    [provider]  atorvastatin (LIPITOR) 20 MG tablet Take 20 mg by mouth daily.     [provider]  calcium gluconate 500 MG tablet Take 500 mg by mouth daily.      [provider]  cholecalciferol (VITAMIN D) 1000 UNITS tablet Take 1,000 Units by mouth daily.      [provider]  Cyanocobalamin  (VITAMIN B-12 IJ) Inject 1,000 mcg as directed every 30 (thirty) days. Pt gets shot once a month     [provider]  losartan (COZAAR) 100 MG tablet Take 100 mg by mouth daily.      [provider]  memantine  (NAMENDA ) 10 MG tablet Take 1 tablet  twice a day 03/26/24   Wertman, Sara E, PA-C  mesalamine  (LIALDA ) 1.2 g EC tablet  Take 2 tablets (2.4 g total) by mouth daily with breakfast. 07/10/24   Pyrtle, Gordy HERO, MD  Misc Natural Products (COLON CARE PO) Take 1 tablet by mouth daily as needed (constipation).     [provider]  Multiple Vitamins-Minerals (CENTRUM SILVER PO) Take 1 tablet by mouth daily.      [provider]  pantoprazole  (PROTONIX ) 40 MG tablet Take 1 tablet (40 mg total) by mouth 2 (two) times daily before a meal. NEEDS APPT FOR FURTHER REFILLS 04/09/24   Pyrtle, Gordy HERO, MD  sertraline (ZOLOFT) 50 MG tablet SMARTSIG:1 Tablet(s) By Mouth Every Evening 06/26/22   [provider]  vitamin B-12 (CYANOCOBALAMIN ) 500 MCG tablet Take 500 mcg by mouth daily.    [provider]    Allergies: Codeine    Review of Systems  Constitutional:  Positive for activity change.    Updated Vital Signs There were no vitals taken for this visit.  Physical Exam Vitals and nursing note reviewed.  Constitutional:      General: She is not in acute distress.    Appearance: She is not toxic-appearing.  HENT:     Head: Normocephalic and atraumatic.  Eyes:     General: No scleral icterus.    Conjunctiva/sclera: Conjunctivae normal.  Cardiovascular:     Rate and Rhythm: Normal rate and regular rhythm.     Pulses: Normal  pulses.     Heart sounds: Normal heart sounds.  Pulmonary:     Effort: Pulmonary effort is normal. No respiratory distress.     Breath sounds: Normal breath sounds. No wheezing, rhonchi or rales.     Comments: No acute distress, no increased respiratory rate, no tachycardia. Abdominal:     General: Abdomen is flat. Bowel sounds are normal. There is no distension.     Palpations: Abdomen is soft. There is no mass.     Tenderness: There is no abdominal tenderness. There is no right CVA tenderness or left CVA tenderness.  Musculoskeletal:     Right lower leg: No edema.     Left lower leg: No edema.  Skin:    General: Skin is warm and dry.     Capillary Refill:  Capillary refill takes less than 2 seconds.     Findings: No lesion.  Neurological:     General: No focal deficit present.     Mental Status: She is alert. Mental status is at baseline.     Comments: Patient alert and oriented with no slurred speech.  Upper extremity sensation and strength intact.  Lower extremity sensation and strength intact. A&Ox2     (all labs ordered are listed, but only abnormal results are displayed) Labs Reviewed  COMPREHENSIVE METABOLIC PANEL WITH GFR - Abnormal; Notable for the following components:      Result Value   Glucose, Bld 200 (*)    Calcium 8.8 (*)    Total Protein 6.0 (*)    Albumin 3.2 (*)    Total Bilirubin 1.3 (*)    GFR, Estimated 55 (*)    All other components within normal limits  URINALYSIS, ROUTINE W REFLEX MICROSCOPIC - Abnormal; Notable for the following components:   Specific Gravity, Urine 1.033 (*)    Glucose, UA 150 (*)    Hgb urine dipstick MODERATE (*)    All other components within normal limits  I-STAT CHEM 8, ED - Abnormal; Notable for the following components:   Sodium 146 (*)    Glucose, Bld 195 (*)    All other components within normal limits  LIPASE, BLOOD  CBC WITH DIFFERENTIAL/PLATELET  BRAIN NATRIURETIC PEPTIDE  TROPONIN I (HIGH SENSITIVITY)    EKG: EKG Interpretation Date/Time:  Friday July 13 2024 12:25:43 EDT Ventricular Rate:  84 PR Interval:  170 QRS Duration:  117 QT Interval:  396 QTC Calculation: 469 R Axis:   221  Text Interpretation: Sinus or ectopic atrial rhythm Confirmed by Ruthe Cornet [phone]) on 07/13/2024 12:27:03 PM  Radiology: CT ABDOMEN PELVIS W CONTRAST Result Date: 07/13/2024 CLINICAL DATA:  Abdominal pain, acute, nonlocalized EXAM: CT ABDOMEN AND PELVIS WITH CONTRAST TECHNIQUE: Multidetector CT imaging of the abdomen and pelvis was performed using the standard protocol following bolus administration of intravenous contrast. RADIATION DOSE REDUCTION: This exam was performed  according to the departmental dose-optimization program which includes automated exposure control, adjustment of the mA and/or kV according to patient size and/or use of iterative reconstruction technique. CONTRAST:  75mL OMNIPAQUE  IOHEXOL  350 MG/ML SOLN COMPARISON:  None Available. FINDINGS: Lower chest: Mild bibasilar bronchiectasis and linear atelectasis/scarring. Chronic eventration of the right hemidiaphragm. Foci of coronary artery calcifications. Hepatobiliary: 1.5 cm hyperattenuating focus in the right hepatic lobe may represent a flash filling hemangioma. Gallbladder is unremarkable. No biliary dilatation. Pancreas: Atrophy of the pancreatic body and tail. No ductal dilation or surrounding inflammatory changes. Spleen: Normal in size without focal abnormality. Adrenals/Urinary Tract: Adrenal glands are  unremarkable. Kidneys enhance symmetrically. 3 cm simple cyst arising from the interpolar right kidney, for which no follow-up imaging is recommended. Additional subcentimeter focal hypodensities bilaterally are too small to definitively characterize. 4 mm calculus at the left ureterovesicular junction with mild left hydroureter and pelvicaliceal prominence. Bladder is otherwise unremarkable. Stomach/Bowel: Stomach is within normal limits. There is a large diverticulum arising from the proximal first part of the duodenum and a small diverticulum arising from the distal third part of the duodenum. Small bowel is otherwise unremarkable. No obstruction. Appendix is not clearly identified. No pericecal inflammatory changes. Descending and sigmoid colonic diverticulosis without evidence of acute diverticulitis. Vascular/Lymphatic: Abdominal aorta is normal in caliber with atherosclerotic calcification. No enlarged abdominal or pelvic lymph nodes. Reproductive: Fibroid uterus with a calcified fibroid at the fundus measuring up to 2 cm. No adnexal masses. Other: No abdominopelvic ascites. No intraperitoneal free  air. No abdominal wall hernia. Musculoskeletal: Diffuse osseous demineralization. No acute osseous abnormality. No suspicious osseous lesion. Levocurvature of the thoracolumbar spine with multilevel degenerative changes of the thoracic and lumbar spine. 3.6 x 3.0 x 1.4 cm intramuscular lipoma in the left internal oblique muscle. IMPRESSION: 1. 4 mm calculus at the left ureterovesicular junction with mild left hydroureter and pelvicalceal prominence. 2. Colonic diverticulosis without evidence of acute diverticulitis. 3. Large diverticulum arising from the proximal first part of the duodenum and additional smaller diverticulum arising from the distal duodenum. 4. Fibroid uterus. 5. Additional nonacute findings, as described above. 6.  Aortic Atherosclerosis (ICD10-I70.0). Electronically Signed   By: Harrietta Sherry M.D.   On: 07/13/2024 14:37   CT Angio Chest PE W and/or Wo Contrast Result Date: 07/13/2024 CLINICAL DATA:  Back pain, dizziness. EXAM: CT ANGIOGRAPHY CHEST WITH CONTRAST TECHNIQUE: Multidetector CT imaging of the chest was performed using the standard protocol during bolus administration of intravenous contrast. Multiplanar CT image reconstructions and MIPs were obtained to evaluate the vascular anatomy. RADIATION DOSE REDUCTION: This exam was performed according to the departmental dose-optimization program which includes automated exposure control, adjustment of the mA and/or kV according to patient size and/or use of iterative reconstruction technique. CONTRAST:  75mL OMNIPAQUE  IOHEXOL  350 MG/ML SOLN COMPARISON:  September 02, 2022. FINDINGS: Cardiovascular: Satisfactory opacification of the pulmonary arteries to the segmental level. No evidence of pulmonary embolism. Normal heart size. No pericardial effusion. Coronary artery calcifications are noted. Mediastinum/Nodes: 16 mm left thyroid  nodule is noted. No adenopathy is noted. Esophagus is unremarkable. Lungs/Pleura: No pneumothorax or pleural  effusion is noted. Minimal bilateral posterior basilar subsegmental atelectasis is noted. 4 mm nodule is noted in left lower lobe best seen on image number 96 of series 6. Upper Abdomen: No acute abnormality. Musculoskeletal: No chest wall abnormality. No acute or significant osseous findings. Review of the MIP images confirms the above findings. IMPRESSION: 1. No definite evidence of pulmonary embolus. 2. Coronary artery calcifications are noted suggesting coronary artery disease. 3. 16 mm left thyroid  nodule is noted. Recommend thyroid  US . (Ref: J Am Coll Radiol. 2015 Feb;12(2): 143-50). 4. 4 mm nodule is noted in left lower lobe. No follow-up needed if patient is low-risk.This recommendation follows the consensus statement: Guidelines for Management of Incidental Pulmonary Nodules Detected on CT Images: From the Fleischner Society 2017; Radiology 2017; 284:228-243. 5.  Aortic Atherosclerosis (ICD10-I70.0). Electronically Signed   By: Lynwood Landy Raddle M.D.   On: 07/13/2024 14:22     Procedures   Medications Ordered in the ED - No data to display  Medical Decision Making Amount and/or Complexity of Data Reviewed Labs: ordered. Radiology: ordered.  Risk Prescription drug management.   This patient presents to the ED for concern of shortness of breath, this involves an extensive number of treatment options, and is a complaint that carries with it a high risk of complications and morbidity.  The differential diagnosis includes CHF, PE, pneumonia, pneumothorax, pulmonary embolus, asthma, COPD   Co morbidities that complicate the patient evaluation  Hypertension, hyperlipidemia, dementia   Additional history obtained:  Additional history obtained from UC noted 07/13/24 from todays visit.    Lab Tests:  I personally interpreted labs.  The pertinent results include:   No leukocytosis.  No significant anemia. Creatinine 0.9, mildly elevated glucose at 200 no  anion gap. Normal troponin, normal BNP. UA positive for hemoglobin and red blood cells.  No bacteria nitrates or leukocytes.   Imaging Studies ordered:  I ordered imaging studies including CT PE/CT abd/pelvis I independently visualized and interpreted imaging which showed left 4 mm UVJ stone.  Other incidental findings which I discussed with patient and family I agree with the radiologist interpretation   Cardiac Monitoring: / EKG:  The patient was maintained on a cardiac monitor.  I personally viewed and interpreted the cardiac monitored which showed an underlying rhythm of: sinus with PAC   Problem List / ED Course / Critical interventions / Medication management  Presents to emergency room with complaint of episode of back pain abdominal pain/shortness of breath and nausea.  She was sent from urgent care.  On arrival she is asymptomatic.  She has no complaints on her physical exam.  Her family reports approximately an hour and a half episode of pain.  She has no red flag symptoms associated with her back pain.  Her lab work shows baseline creatinine.  No leukocytosis.  She is afebrile.  She has no urinary tract infection.  CT PE study negative and she has several incidental findings which I discussed.  She does have kidney stone. 4mm.  She has no sign of systemic illness she is tolerating oral intake and pain is well-controlled thus I feel she is appropriate for discharge with outpatient follow-up. I ordered medication including zofran  Reevaluation of the patient after these medicines showed that the patient improved I have reviewed the patients home medicines and have made adjustments as needed. Stable for discharge with outpatient follow-up with urology.  Given return precautions.  Stable for discharge.  Pain is well-controlled and tolerating oral intake.      Final diagnoses:  Kidney stone  Diverticulosis  Fibroid  Thyroid  nodule  Lung nodule    ED Discharge Orders           Ordered    tamsulosin  (FLOMAX ) 0.4 MG CAPS capsule  Daily after supper        07/13/24 1539    naproxen  (NAPROSYN ) 500 MG tablet  2 times daily        07/13/24 1539    ondansetron  (ZOFRAN -ODT) 4 MG disintegrating tablet  Every 8 hours PRN        07/13/24 1539               Jahzir Strohmeier, Warren SAILOR, PA-C 07/13/24 1556    Cur[place]olo, Adam, DO 07/13/24 1603

## 2024-07-13 NOTE — ED Notes (Signed)
 Patient is being discharged from the Urgent Care and sent to the Emergency Department via [facility]. Per Dr Darral, patient is in need of higher level of care due to back pain, abdominal pain, n/v, tachypnea, hypertensive. Patient is aware and verbalizes understanding of plan of care.  Vitals:   07/13/24 1116 07/13/24 1124  BP: (!) 196/116 (!) 182/92  Pulse: (!) 115 93  Resp: (!) 29   Temp: 97.6 F (36.4 C)   SpO2: 98%

## 2024-07-13 NOTE — ED Provider Notes (Signed)
 [name]    CSN: [id number] Arrival date & time: 07/13/24  1106      History   Chief Complaint Chief Complaint  Patient presents with   Shortness of Breath   Dizziness    HPI Erica Carter is a 89 y.o. female.    Shortness of Breath Associated symptoms: abdominal pain and cough   Dizziness Associated symptoms: shortness of breath    Patient is here for sudden onset of back pain, abdominal pain, sob, nausea with vomiting and dizziness.  She was in the [place] to the [place] with her daughter when this started.  She is vomiting here in the office.  She does have h/o dementia.   She was otherwise acting normally prior to this.  No chest pain.  She has had a cough, but no other URI symptoms.  No urinary symptoms. No diarrhea.        Past Medical History:  Diagnosis Date   Acute gastritis without mention of hemorrhage    Dementia (HCC)    Diabetes mellitus without complication (HCC)    diet control, per pt   Diverticulosis of colon (without mention of hemorrhage)    Esophageal dysmotilities    Esophageal reflux    Hiatal hernia    Other and unspecified hyperlipidemia    Personal history of colonic polyps 09/11/2010   hyperplasic   Skin cancer    Stricture and stenosis of esophagus    Ulcerative colitis, unspecified    Unspecified essential hypertension    Wears dentures    upper and lower   Wears glasses     Patient Active Problem List   Diagnosis Date Noted   Hiatal hernia    Gastric polyp    Status post epidural steroid injection 08/04/2017   Unilateral primary osteoarthritis, left hip 06/17/2017   History of gastroesophageal reflux (GERD) 10/26/2011   GASTRITIS, ACUTE W/O HEMORRHAGE 09/11/2010   HYPERLIPIDEMIA 02/12/2008   Essential hypertension 02/12/2008   GERD 02/12/2008   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   HIATAL HERNIA 05/17/2006   COLITIS, ULCERATIVE 05/17/2006   DIVERTICULOSIS, COLON 05/17/2006    Past Surgical History:   Procedure Laterality Date   BRAVO PH STUDY N/A 09/07/2019   Procedure: BRAVO PH STUDY;  Surgeon: San Sandor GAILS, DO;  Location: [facility];  Service: Gastroenterology;  Laterality: N/A;   BUNIONECTOMY     Bil   CATARACT EXTRACTION Bilateral    ESOPHAGEAL MANOMETRY N/A 08/08/2019   Procedure: ESOPHAGEAL MANOMETRY (EM);  Surgeon: Shila Gustav GAILS, MD;  Location: [facility];  Service: Endoscopy;  Laterality: N/A;   ESOPHAGOGASTRODUODENOSCOPY (EGD) WITH PROPOFOL  N/A 09/07/2019   Procedure: ESOPHAGOGASTRODUODENOSCOPY (EGD) WITH PROPOFOL ;  Surgeon: San Sandor GAILS, DO;  Location: [facility];  Service: Gastroenterology;  Laterality: N/A;   HEMOSTASIS CLIP PLACEMENT  09/07/2019   Procedure: HEMOSTASIS CLIP PLACEMENT;  Surgeon: San Sandor GAILS, DO;  Location: [facility];  Service: Gastroenterology;;   NASAL SINUS SURGERY     POLYPECTOMY  09/07/2019   Procedure: POLYPECTOMY;  Surgeon: San Sandor GAILS, DO;  Location: [facility];  Service: Gastroenterology;;   THUMB ARTHROSCOPY Left    TUBAL LIGATION      OB History   No obstetric history on file.      Home Medications    Prior to Admission medications   Medication Sig Start Date End Date Taking? Authorizing Provider  amLODipine (NORVASC) 5 MG tablet Take 5 mg by mouth daily.    [provider]  aspirin 81 MG tablet Take 81 mg by mouth daily.    [provider]  atorvastatin (LIPITOR) 20 MG tablet Take 20 mg by mouth daily.     [provider]  calcium gluconate 500 MG tablet Take 500 mg by mouth daily.      [provider]  cholecalciferol (VITAMIN D) 1000 UNITS tablet Take 1,000 Units by mouth daily.      [provider]  Cyanocobalamin  (VITAMIN B-12 IJ) Inject 1,000 mcg as directed every 30 (thirty) days. Pt gets shot once a month     [provider]  losartan (COZAAR) 100 MG tablet Take 100 mg by mouth daily.      [provider]  memantine  (NAMENDA )  10 MG tablet Take 1 tablet  twice a day 03/26/24   Wertman, Sara E, PA-C  mesalamine  (LIALDA ) 1.2 g EC tablet Take 2 tablets (2.4 g total) by mouth daily with breakfast. 07/10/24   Pyrtle, Gordy HERO, MD  Misc Natural Products (COLON CARE PO) Take 1 tablet by mouth daily as needed (constipation).     [provider]  Multiple Vitamins-Minerals (CENTRUM SILVER PO) Take 1 tablet by mouth daily.      [provider]  pantoprazole  (PROTONIX ) 40 MG tablet Take 1 tablet (40 mg total) by mouth 2 (two) times daily before a meal. NEEDS APPT FOR FURTHER REFILLS 04/09/24   Pyrtle, Gordy HERO, MD  sertraline (ZOLOFT) 50 MG tablet SMARTSIG:1 Tablet(s) By Mouth Every Evening 06/26/22   [provider]  vitamin B-12 (CYANOCOBALAMIN ) 500 MCG tablet Take 500 mcg by mouth daily.    [provider]    Family History Family History  Problem Relation Age of Onset   Thyroid  cancer Brother        mets to brain   Heart disease Brother    Diabetes Mother    Diabetes Sister    Heart disease Father    Heart disease Sister    Heart disease Brother    Heart disease Brother    Heart disease Brother    Colon cancer Neg Hx    Inflammatory bowel disease Neg Hx    Liver disease Neg Hx    Stomach cancer Neg Hx    Pancreatic cancer Neg Hx     Social History Social History   Tobacco Use   Smoking status: Former   Smokeless tobacco: Never   Tobacco comments:    stopped over 20 years  Vaping Use   Vaping status: Never Used  Substance Use Topics   Alcohol use: No   Drug use: No     Allergies   Codeine   Review of Systems Review of Systems  Constitutional: Negative.   HENT: Negative.    Respiratory:  Positive for cough and shortness of breath.   Cardiovascular: Negative.   Gastrointestinal:  Positive for abdominal pain.  Genitourinary: Negative.   Musculoskeletal:  Positive for back pain.  Neurological:  Positive for dizziness.     Physical Exam Triage Vital Signs ED Triage  Vitals [07/13/24 1116]  Encounter Vitals Group     BP (!) 196/116     Girls Systolic BP Percentile      Girls Diastolic BP Percentile      Boys Systolic BP Percentile      Boys Diastolic BP Percentile      Pulse Rate (!) 115     Resp (!) 29     Temp 97.6 F (36.4 C)  Temp Source Oral     SpO2 98 %     Weight      Height      Head Circumference      Peak Flow      Pain Score      Pain Loc      Pain Education      Exclude from Growth Chart    No data found.  Updated Vital Signs BP (!) 182/92 (BP Location: Left Arm)   Pulse 93   Temp 97.6 F (36.4 C) (Oral)   Resp (!) 29   SpO2 98%   Visual Acuity Right Eye Distance:   Left Eye Distance:   Bilateral Distance:    Right Eye Near:   Left Eye Near:    Bilateral Near:     Physical Exam Constitutional:      Appearance: She is well-developed and normal weight.     Comments: Actively vomiting  Cardiovascular:     Rate and Rhythm: Normal rate and regular rhythm.  Pulmonary:     Effort: Pulmonary effort is normal.     Breath sounds: Normal breath sounds.  Abdominal:     Palpations: Abdomen is soft.     Tenderness: There is abdominal tenderness.  Musculoskeletal:     Cervical back: Normal range of motion and neck supple.  Skin:    General: Skin is warm.  Neurological:     General: No focal deficit present.     Mental Status: She is alert.  Psychiatric:        Mood and Affect: Mood normal.      UC Treatments / Results  Labs (all labs ordered are listed, but only abnormal results are displayed) Labs Reviewed - No data to display  EKG   Radiology No results found.  Procedures Procedures (including critical care time)  Medications Ordered in UC Medications - No data to display  Initial Impression / Assessment and Plan / UC Course  I have reviewed the triage vital signs and the nursing notes.  Pertinent labs & imaging results that were available during my care of the patient were reviewed by me  and considered in my medical decision making (see chart for details).   Patient was seen today for acute onset of abdominal pain, back pain, dizziness, sob, nausea and vomiting.  Upon arrival she was actively vomiting.  Her blood pressure, HR and respirations where elevated.  As a result, she will be sent to the ER for further evaluation and testing.   Final Clinical Impressions(s) / UC Diagnoses   Final diagnoses:  Nausea and vomiting, unspecified vomiting type  Acute bilateral thoracic back pain  Generalized abdominal pain  SOB (shortness of breath)     Discharge Instructions      Given your symptoms today you will be transported to the ER via EMS    ED Prescriptions   None    PDMP not reviewed this encounter.   Darral Longs, MD 07/13/24 1137

## 2024-07-13 NOTE — Discharge Instructions (Addendum)
 Please call urology for follow-up.  You can alternate naproxen  and Tylenol  for pain control.  Take Zofran  for nausea vomiting.  Take tamsulosin  once in the evening to help pass stone. Return with fever, unable to urinate or worsening symptoms.   You have incidental findings on imaging, however, the only follow up that you need is a thyroid  US . Please talk to family doctor about scheduling this.

## 2024-07-13 NOTE — ED Triage Notes (Signed)
 BIB [facility] from UC started c/o feeling dizzy, nauseated, back pain and breathing very heavily. States she is feeling fine now. A&O X 2 250 CBG Left BBB on EKG 80s

## 2024-07-13 NOTE — ED Triage Notes (Signed)
 Family with patient reports about 10 minutes ago while in car on way to [place] started c/o feeling dizzy, nauseated, back pain and breathing very heavily

## 2024-07-13 NOTE — ED Notes (Signed)
 [facility] Emergent transport made aware of patient needing to go to ED.

## 2024-07-13 NOTE — Discharge Instructions (Signed)
 Given your symptoms today you will be transported to the ER via EMS

## 2024-07-16 DIAGNOSIS — E538 Deficiency of other specified B group vitamins: Secondary | ICD-10-CM | POA: Diagnosis not present

## 2024-07-16 DIAGNOSIS — N200 Calculus of kidney: Secondary | ICD-10-CM | POA: Diagnosis not present

## 2024-08-13 DIAGNOSIS — Z23 Encounter for immunization: Secondary | ICD-10-CM | POA: Diagnosis not present

## 2024-08-13 DIAGNOSIS — E538 Deficiency of other specified B group vitamins: Secondary | ICD-10-CM | POA: Diagnosis not present

## 2024-08-17 ENCOUNTER — Other Ambulatory Visit: Payer: Self-pay | Admitting: Internal Medicine

## 2024-08-31 DIAGNOSIS — D519 Vitamin B12 deficiency anemia, unspecified: Secondary | ICD-10-CM | POA: Diagnosis not present

## 2024-08-31 DIAGNOSIS — I129 Hypertensive chronic kidney disease with stage 1 through stage 4 chronic kidney disease, or unspecified chronic kidney disease: Secondary | ICD-10-CM | POA: Diagnosis not present

## 2024-08-31 DIAGNOSIS — E1122 Type 2 diabetes mellitus with diabetic chronic kidney disease: Secondary | ICD-10-CM | POA: Diagnosis not present

## 2024-08-31 DIAGNOSIS — N183 Chronic kidney disease, stage 3 unspecified: Secondary | ICD-10-CM | POA: Diagnosis not present

## 2024-08-31 DIAGNOSIS — G4700 Insomnia, unspecified: Secondary | ICD-10-CM | POA: Diagnosis not present

## 2024-08-31 DIAGNOSIS — I422 Other hypertrophic cardiomyopathy: Secondary | ICD-10-CM | POA: Diagnosis not present

## 2024-08-31 DIAGNOSIS — Z0000 Encounter for general adult medical examination without abnormal findings: Secondary | ICD-10-CM | POA: Diagnosis not present

## 2024-08-31 DIAGNOSIS — E782 Mixed hyperlipidemia: Secondary | ICD-10-CM | POA: Diagnosis not present

## 2024-08-31 DIAGNOSIS — K5190 Ulcerative colitis, unspecified, without complications: Secondary | ICD-10-CM | POA: Diagnosis not present

## 2024-08-31 DIAGNOSIS — G309 Alzheimer's disease, unspecified: Secondary | ICD-10-CM | POA: Diagnosis not present

## 2024-08-31 LAB — LAB REPORT - SCANNED
Creatinine, POC: 224 mg/dL
EGFR: 67
Microalb Creat Ratio: 13

## 2024-09-09 NOTE — Progress Notes (Unsigned)
 Ellouise Console, PA-C [address] [place], [place]  [address] Phone: [phone]   Primary Care Physician: Loreli Kins, MD  Primary Gastroenterologist:  Ellouise Console, PA-C / Dr. Gordy Starch   Chief Complaint: Follow-up ulcerative colitis and GERD.      HPI:   89 year old female, established patient Dr. Starch, presents for annual follow-up of ulcerative colitis.  Currently taking mesalamine  2.4 g daily.  Needs medication refill.   Last follow-up visit with Dr. Starch was 02/2023.  She has history of ulcerative colitis, colon polyps, GERD with hiatal hernia, B12 deficiency, chronic constipation, and dementia.  She is taking pantoprazole  40 mg twice daily.  Takes OTC senna for constipation.  Monthly B12 injections through PCP.  SABRA She is here today with her daughter Eleanor.    Current Symptoms:  None.  Patient is doing well with no symptoms or concerns today.  Specifically she denies heartburn, abdominal pain, diarrhea, rectal bleeding, or weight loss.  Her medications are working well.  Her daughter has no concerns today.  They need refill of medication.  07/13/2024 labs: Normal CBC (WBC 7.1, Hgb 14.5).  BUN 14, creatinine 0.99, GFR 55.  08/2019 last EGD: 3 cm hiatal hernia, torturous esophagus, a single 15 mm hyperplastic gastric polyp, otherwise normal.  Bravo pH capsule study was normal.  pH manometry was normal.  03/2014 last colonoscopy: Moderate melanosis in the entire colon.  6 small (3 mm to 7 mm) polyps removed.  Pathology showed benign lymphoid and colon mucosal tissue.  No adenomatous changes.  Mild diverticulosis.  No active colitis.  No repeat due to advanced age.  Current Outpatient Medications  Medication Sig Dispense Refill   amLODipine (NORVASC) 5 MG tablet Take 5 mg by mouth daily.     aspirin 81 MG tablet Take 81 mg by mouth daily.     atorvastatin (LIPITOR) 20 MG tablet Take 20 mg by mouth daily.      calcium gluconate 500 MG tablet Take 500 mg by mouth  daily.       cholecalciferol (VITAMIN D) 1000 UNITS tablet Take 1,000 Units by mouth daily.       Cyanocobalamin  (VITAMIN B-12 IJ) Inject 1,000 mcg as directed every 30 (thirty) days. Pt gets shot once a month      losartan (COZAAR) 100 MG tablet Take 100 mg by mouth daily.       memantine  (NAMENDA ) 10 MG tablet Take 1 tablet  twice a day 180 tablet 3   Misc Natural Products (COLON CARE PO) Take 1 tablet by mouth daily as needed (constipation).      Multiple Vitamins-Minerals (CENTRUM SILVER PO) Take 1 tablet by mouth daily.       naproxen  (NAPROSYN ) 500 MG tablet Take 1 tablet (500 mg total) by mouth 2 (two) times daily. 30 tablet 0   ondansetron  (ZOFRAN -ODT) 4 MG disintegrating tablet Take 1 tablet (4 mg total) by mouth every 8 (eight) hours as needed for nausea or vomiting. 20 tablet 0   sertraline (ZOLOFT) 50 MG tablet SMARTSIG:1 Tablet(s) By Mouth Every Evening     tamsulosin  (FLOMAX ) 0.4 MG CAPS capsule Take 1 capsule (0.4 mg total) by mouth daily after supper. 15 capsule 0   traZODone (DESYREL) 50 MG tablet Take 50 mg by mouth at bedtime as needed.     vitamin B-12 (CYANOCOBALAMIN ) 500 MCG tablet Take 500 mcg by mouth daily.     mesalamine  (LIALDA ) 1.2 g EC tablet Take 2 tablets (  2.4 g total) by mouth daily with breakfast. 180 tablet 3   pantoprazole  (PROTONIX ) 40 MG tablet Take 1 tablet (40 mg total) by mouth daily before breakfast. 90 tablet 3   No current facility-administered medications for this visit.    Allergies as of 09/10/2024 - Review Complete 09/10/2024  Allergen Reaction Noted   Codeine  05/08/2008    Past Medical History:  Diagnosis Date   Acute gastritis without mention of hemorrhage    Dementia (HCC)    Diabetes mellitus without complication (HCC)    diet control, per pt   Diverticulosis of colon (without mention of hemorrhage)    Esophageal dysmotilities    Esophageal reflux    Hiatal hernia    Other and unspecified hyperlipidemia    Personal history of  colonic polyps 09/11/2010   hyperplasic   Skin cancer    Stricture and stenosis of esophagus    Ulcerative colitis, unspecified    Unspecified essential hypertension    Wears dentures    upper and lower   Wears glasses     Past Surgical History:  Procedure Laterality Date   BRAVO PH STUDY N/A 09/07/2019   Procedure: BRAVO PH STUDY;  Surgeon: San Sandor GAILS, DO;  Location: [facility];  Service: Gastroenterology;  Laterality: N/A;   BUNIONECTOMY     Bil   CATARACT EXTRACTION Bilateral    ESOPHAGEAL MANOMETRY N/A 08/08/2019   Procedure: ESOPHAGEAL MANOMETRY (EM);  Surgeon: Shila Gustav GAILS, MD;  Location: [facility];  Service: Endoscopy;  Laterality: N/A;   ESOPHAGOGASTRODUODENOSCOPY (EGD) WITH PROPOFOL  N/A 09/07/2019   Procedure: ESOPHAGOGASTRODUODENOSCOPY (EGD) WITH PROPOFOL ;  Surgeon: San Sandor GAILS, DO;  Location: [facility];  Service: Gastroenterology;  Laterality: N/A;   HEMOSTASIS CLIP PLACEMENT  09/07/2019   Procedure: HEMOSTASIS CLIP PLACEMENT;  Surgeon: San Sandor GAILS, DO;  Location: [facility];  Service: Gastroenterology;;   NASAL SINUS SURGERY     POLYPECTOMY  09/07/2019   Procedure: POLYPECTOMY;  Surgeon: San Sandor GAILS, DO;  Location: [facility];  Service: Gastroenterology;;   THUMB ARTHROSCOPY Left    TUBAL LIGATION      Review of Systems:    All systems reviewed and negative except where noted in HPI.    Physical Exam:  BP 116/70 (BP Location: Left Arm, Patient Position: Sitting, Cuff Size: Normal)   Pulse 72   Ht 4' 11.75 (1.518 m) Comment: height measured without shoes  Wt 106 lb 6 oz (48.3 kg)   BMI 20.95 kg/m  No LMP recorded. Patient is postmenopausal.  General: Well-nourished, well-developed in no acute distress.  Lungs: Clear to auscultation bilaterally. Non-labored. Heart: Regular rate and rhythm, no murmurs rubs or gallops.  Abdomen: Bowel sounds are normal; Abdomen is Soft; No hepatosplenomegaly, masses or hernias;  No  Abdominal Tenderness; No guarding or rebound tenderness. Neuro: Alert and oriented x 3.  Grossly intact.  Moderate memory impairment noted. Psych: Alert and cooperative, normal mood and affect.   Imaging Studies:  07/13/2024 CT abdomen pelvis with contrast 1. 4 mm calculus at the left ureterovesicular junction with mild left hydroureter and pelvicalceal prominence. 2. Colonic diverticulosis without evidence of acute diverticulitis. 3. Large diverticulum arising from the proximal first part of the duodenum and additional smaller diverticulum arising from the distal duodenum. 4. Fibroid uterus. 5. Additional nonacute findings, as described above. 6.  Aortic Atherosclerosis  Labs: CBC    Component Value Date/Time   WBC 7.1 07/13/2024 1227   RBC 4.97 07/13/2024 1227   HGB 14.6  07/13/2024 1246   HCT 43.0 07/13/2024 1246   PLT 243 07/13/2024 1227   MCV 90.1 07/13/2024 1227   MCH 29.2 07/13/2024 1227   MCHC 32.4 07/13/2024 1227   RDW 13.2 07/13/2024 1227   LYMPHSABS 1.1 07/13/2024 1227   MONOABS 0.6 07/13/2024 1227   EOSABS 0.1 07/13/2024 1227   BASOSABS 0.0 07/13/2024 1227    CMP     Component Value Date/Time   NA 146 (H) 07/13/2024 1246   K 3.5 07/13/2024 1246   CL 108 07/13/2024 1246   CO2 25 07/13/2024 1227   GLUCOSE 195 (H) 07/13/2024 1246   BUN 18 07/13/2024 1246   CREATININE 0.90 07/13/2024 1246   CALCIUM 8.8 (L) 07/13/2024 1227   PROT 6.0 (L) 07/13/2024 1227   ALBUMIN 3.2 (L) 07/13/2024 1227   AST 37 07/13/2024 1227   ALT 7 07/13/2024 1227   ALKPHOS 87 07/13/2024 1227   BILITOT 1.3 (H) 07/13/2024 1227   GFRNONAA 55 (L) 07/13/2024 1227   GFRAA 59 (L) 07/12/2017 1404     Assessment and Plan:   JAZIAH KWASNIK is a 89 y.o. y/o female returns for follow-up of ulcerative colitis and GERD.  She has history of ulcerative colitis, colon polyps, GERD with hiatal hernia, B12 deficiency, chronic constipation, and dementia.    1.  Ulcerative colitis in clinical  remission on mesalamine  - Rx Lialda  (mesalamine ) 1.2 g, take 2 tablets once daily every morning, #180, 3 refills.  2.  GERD: Controlled on PPI. - Recommend decrease pantoprazole  to 40 mg once daily in the morning. - Add OTC Pepcid  20 Mg once or twice daily in the afternoon or evening if needed for breakthrough heartburn symptoms. - We discussed adverse side effects of PPIs to include vitamin deficiencies, osteoporosis, and renal insufficiency.  Recommend take lowest effective dose of PPI necessary to control acid reflux.  OK to add H2RB (Pepcid  20mg  daily) or antiacid if needed for breakthrough acid reflux.  - Recommend Lifestyle Modifications to prevent Acid Reflux.  Rec. Avoid coffee, sodas, peppermint, garlic, onions, alcohol, citrus fruits, chocolate, tomatoes, fatty and spicey foods.  Avoid eating 2-3 hours before bedtime.    3.  Chronic constipation - Continue OTC senna as needed - Recommend High Fiber diet with fruits, vegetables, and whole grains. - Drink 64 ounces of Fluids Daily.     Ellouise Console, PA-C  Follow up in 1 year or sooner if she develops GI symptoms.

## 2024-09-10 ENCOUNTER — Ambulatory Visit: Admitting: Physician Assistant

## 2024-09-10 ENCOUNTER — Encounter: Payer: Self-pay | Admitting: Physician Assistant

## 2024-09-10 VITALS — BP 116/70 | HR 72 | Ht 59.75 in | Wt 106.4 lb

## 2024-09-10 DIAGNOSIS — K219 Gastro-esophageal reflux disease without esophagitis: Secondary | ICD-10-CM

## 2024-09-10 DIAGNOSIS — K5100 Ulcerative (chronic) pancolitis without complications: Secondary | ICD-10-CM | POA: Diagnosis not present

## 2024-09-10 MED ORDER — MESALAMINE 1.2 G PO TBEC: 2.4000 g | DELAYED_RELEASE_TABLET | Freq: Every day | ORAL | 3 refills | Status: AC

## 2024-09-10 MED ORDER — PANTOPRAZOLE SODIUM 40 MG PO TBEC: 40.0000 mg | DELAYED_RELEASE_TABLET | Freq: Every day | ORAL | 3 refills | Status: AC

## 2024-09-10 NOTE — Patient Instructions (Addendum)
 I have refilled mesalamine , take 2 tablets once daily with breakfast for ulcerative colitis.  I also refilled pantoprazole  40 mg.  Take 1 tablet once daily, 30 minutes before breakfast for acid reflux.  If you have breakthrough heartburn or acid reflux, then you can take over-the-counter Pepcid  (famotidine ) 20 mg once or twice a daily in the evening if needed.  You can also take OTC antacids such as Tums, Mylanta, Rolaids, Maalox if needed for breakthrough heartburn.  Recommend Lifestyle Modifications to prevent Acid Reflux.  Rec. Avoid coffee, sodas, peppermint, garlic, onions, alcohol, citrus fruits, chocolate, tomatoes, fatty and spicey foods.  Avoid eating 2-3 hours before bedtime.    Follow-up in 1 year to refill medication.  Above you are doing well today!  Nice to meet you!  Ellouise Console, PA-C   Please follow up sooner if symptoms increase or worsen  Due to recent changes in healthcare laws, you may see the results of your imaging and laboratory studies on MyChart before your provider has had a chance to review them.  We understand that in some cases there may be results that are confusing or concerning to you. Not all laboratory results come back in the same time frame and the provider may be waiting for multiple results in order to interpret others.  Please give us  48 hours in order for your provider to thoroughly review all the results before contacting the office for clarification of your results.   Thank you for trusting me with your gastrointestinal care!   Ellouise Console, PA-C _______________________________________________________  If your blood pressure at your visit was 140/90 or greater, please contact your primary care physician to follow up on this.  _______________________________________________________  If you are age 51 or older, your body mass index should be between 23-30. Your Body mass index is 20.95 kg/m. If this is out of the aforementioned range listed, please  consider follow up with your Primary Care Provider.  If you are age 33 or younger, your body mass index should be between 19-25. Your Body mass index is 20.95 kg/m. If this is out of the aformentioned range listed, please consider follow up with your Primary Care Provider.   ________________________________________________________  The [facility] providers would like to encourage you to use MYCHART to communicate with providers for non-urgent requests or questions.  Due to long hold times on the telephone, sending your provider a message by [facility] may be a faster and more efficient way to get a response.  Please allow 48 business hours for a response.  Please remember that this is for non-urgent requests.  _______________________________________________________

## 2024-09-25 NOTE — Progress Notes (Signed)
 Assessment/Plan:   Dementia likely due to Alzheimer's disease***  Erica Carter is a very pleasant 89 y.o. RH female with a history ofulcerative colitis, GERD, B12 deficiency, DM2, CKD 3, osteoarthritis, hypertension, hyperlipidemia, depression, history of anterior communicating artery aneurysm followed at [facility], stage Ib malignant melanoma of the left chest s/p resection and the diagnosis of dementia due to Alzheimer disease seen today in follow up for memory loss. Patient is currently on memantine  10 mg twice daily, tolerating well.  Memory is***with MMSE of/30.  Patient is able to participate on ADLs***and to drive without significant difficulties.  Mood is***    Follow up in   months. Continue memantine  10 mg twice daily side effects discussed Continue B12 supplements as per PCP Recommend good control of her cardiovascular risk factors Continue to control mood as per PCP     Subjective:    This patient is accompanied in the office by her daughter*** who supplements the history.  Previous records as well as any outside records available were reviewed prior to todays visit. Patient was last seen on 03/26/2024 with MMSE 24/30***   Any changes in memory since last visit? .  She has difficulty with short-term memory as before, especially with recent conversations, new information and names of family members.  She enjoys doing crossword puzzles and shows.   Repeats oneself?  Endorsed Disoriented when walking into a room? Denies ***  Leaving objects?  May misplace things but not in unusual places***  Wandering behavior?  denies   Any personality changes since last visit?  Denies.   Any worsening depression?:  Denies.   Hallucinations or paranoia?  Denies.   Seizures? denies    Any sleep changes?  Denies vivid dreams, REM behavior or sleepwalking   Sleep apnea?   Denies.   Any hygiene concerns? Denies.  Independent of bathing and dressing?  Endorsed  Does the patient needs help  with medications?  Patient is in charge *** Who is in charge of the finances?  Patient is in charge   *** Any changes in appetite?  denies ***   Patient have trouble swallowing? Denies.   Does the patient cook? No Any headaches?   denies   Any vision changes?*** Chronic back pain  denies   Ambulates with difficulty? Denies.  *** Recent falls or head injuries? Denies.     Unilateral weakness, numbness or tingling? denies   Any tremors?  Denies  *** Any anosmia?  Denies   Any incontinence of urine?  Endorsed.  In September she had nephrolithiasis without recurrence***  Any bowel dysfunction?  She has chronic intermittent diarrhea due to UC Daughter lives with her   *** Does the patient drive? No longer drives ***   Initial evaluation 08/26/2022 How long did patient have memory difficulties?  Patient reports that about 1 year ago, she began the names of her children, grandchildren, and great-grandchildren.  She began to write at least otherwise I will forget .  She reports that her long-term memory is fairly good. repeats oneself? endorsed Disoriented when walking into a room?  Patient denies but her daughter reports that she wonders many times why she came to the room for.  Leaving objects in unusual places?  Patient denies leaving any unusual places, but she does misplace objects, rather small objects.  Patient lives her daughter lives with her 3 days a week, because she lives out of town.  Otherwise, she has another great granddaughter who stays with her when  not at school.   Ambulates  with difficulty?   Patient denies   Recent falls?  Patient denies   Any head injuries?  Patient denies   History of seizures?   Patient denies   Wandering behavior?  Patient denies   Patient drives?   Daughter got the visit WhatsApp application 360 in case that she gets lost.  There were a couple of episodes in which she may have a felt disoriented during driving. Any mood changes ?  denies  Any  depression? Denies  Hallucinations?  Patient denies   Paranoia?  Patient denies   Patient reports that she does not  sleep  well  over the last 2 years, without vivid dreams, with REM behavior (shadow boxing), denies sleepwalking   .  He just began taking trazodone. History of sleep apnea?  Patient denies   Any hygiene concerns?  Patient denies   Independent of bathing and dressing?  Endorsed  Does the patient needs help with medications?  Patient is in charge Who is in charge of the finances? Patient is in charge, although her daughter reports that she has fallen victim of a scam recently and she is about to start taking charge of the finances. Any changes in appetite?  Patient denies  Patient have trouble swallowing? Patient denies   Does the patient cook?  Not much  Any kitchen accidents such as leaving the stove on? Patient denies   Any headaches?  Patient denies   Double vision? Patient denies   Any focal numbness or tingling?  Patient denies   Chronic back pain Patient denies   Unilateral weakness?  Patient denies   Any tremors?  Patient denies   Any history of anosmia? For years, at least  since the sinus surgery 10 years ago Any incontinence of urine? Endorsed, for the last 2 year she has stress incontinence,  uses pads  Any bowel dysfunction?  She has a history of ulcerative colitis with diarrhea History of heavy alcohol intake?  Patient denies   History of heavy tobacco use?  Patient denies   Family history of dementia?   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  No acute  infarct or other findings.     CURRENT MEDICATIONS:  Outpatient Encounter Medications as of 09/26/2024  Medication Sig   amLODipine (NORVASC) 5 MG tablet Take 5 mg by mouth daily.   aspirin 81 MG tablet Take 81 mg by mouth daily.   atorvastatin (LIPITOR) 20 MG tablet Take 20 mg by mouth daily.    calcium gluconate 500 MG tablet Take 500 mg by mouth daily.     cholecalciferol (VITAMIN D) 1000 UNITS tablet Take 1,000 Units by mouth daily.     Cyanocobalamin  (VITAMIN B-12 IJ) Inject 1,000 mcg as directed every 30 (thirty) days. Pt gets shot once a month    losartan (COZAAR) 100 MG tablet Take 100 mg by mouth daily.     memantine  (NAMENDA ) 10 MG tablet Take 1 tablet  twice a day   mesalamine  (LIALDA ) 1.2 g EC tablet Take 2 tablets (2.4 g total) by mouth  daily with breakfast.   Misc Natural Products (COLON CARE PO) Take 1 tablet by mouth daily as needed (constipation).    Multiple Vitamins-Minerals (CENTRUM SILVER PO) Take 1 tablet by mouth daily.     naproxen  (NAPROSYN ) 500 MG tablet Take 1 tablet (500 mg total) by mouth 2 (two) times daily.   ondansetron  (ZOFRAN -ODT) 4 MG disintegrating tablet Take 1 tablet (4 mg total) by mouth every 8 (eight) hours as needed for nausea or vomiting.   pantoprazole  (PROTONIX ) 40 MG tablet Take 1 tablet (40 mg total) by mouth daily before breakfast.   sertraline (ZOLOFT) 50 MG tablet SMARTSIG:1 Tablet(s) By Mouth Every Evening   tamsulosin  (FLOMAX ) 0.4 MG CAPS capsule Take 1 capsule (0.4 mg total) by mouth daily after supper.   traZODone (DESYREL) 50 MG tablet Take 50 mg by mouth at bedtime as needed.   vitamin B-12 (CYANOCOBALAMIN ) 500 MCG tablet Take 500 mcg by mouth daily.   No facility-administered encounter medications on file as of 09/26/2024.       03/26/2024    9:00 AM 08/17/2023    5:00 PM  MMSE - Mini Mental State Exam  Orientation to time 5 5  Orientation to Place 4 5  Registration 3 3  Attention/ Calculation 4 3  Recall 0 0  Language- name  2 objects 2 2  Language- repeat 1 1  Language- follow 3 step command 3 3  Language- read & follow direction 1 1  Write a sentence 1 1  Copy design 0 0  Total score 24 24      08/30/2022    1:35 PM 08/26/2022   12:00 PM  Montreal Cognitive Assessment   Visuospatial/ Executive (0/5) 2 2  Naming (0/3) 0 0  Attention: Read list of digits (0/2) 2 2  Attention: Read list of letters (0/1) 1 1  Attention: Serial 7 subtraction starting at 100 (0/3) 3 3  Language: Repeat phrase (0/2) 1 1  Language : Fluency (0/1) 0 0  Abstraction (0/2) 0 0  Delayed Recall (0/5) 0 0  Orientation (0/6) 6 6  Total 15 15  Adjusted Score (based on education) 16     Objective:    Neurological Exam:    VITALS:  There were no vitals filed for this visit.  GEN:  The patient appears stated age and is in NAD. HEENT:  Normocephalic, atraumatic.   Neurological examination:  General: NAD, well-groomed, appears stated age. Orientation: The patient is alert. Oriented to person, place and date Cranial nerves: There is good facial symmetry.The speech is fluent and clear. No aphasia or dysarthria. Fund of knowledge is appropriate. Recent and remote memory are impaired. Attention and concentration are reduced. Able to name objects and repeat phrases.  Hearing is intact to conversational tone. *** Sensation: Sensation is intact to light touch throughout Motor: Strength is at least antigravity x4. DTR's 2/4 in UE/LE     Movement examination: Tone: There is normal tone in the UE/LE Abnormal movements:  no tremor.  No myoclonus.  No asterixis.   Coordination:  There is no decremation with RAM's. Normal finger to nose  Gait and Station: The patient has no*** difficulty arising out of a deep-seated chair without the use of the hands. The patient's stride length is good.  Gait is cautious and narrow.    Thank you for allowing us  the opportunity to participate in the care of this nice patient. Please do not hesitate  to contact us  for any questions or concerns.  Total time spent on today's visit was *** minutes dedicated to this patient today, preparing to see patient, examining the patient, ordering tests and/or medications and counseling the patient, documenting clinical information in the EHR or other health record, independently interpreting results and communicating results to the patient/family, discussing treatment and goals, answering patient's questions and coordinating care.  Cc:  Loreli Kins, MD  Camie Sevin 09/25/2024 5:40 AM

## 2024-09-26 ENCOUNTER — Encounter: Payer: Self-pay | Admitting: Physician Assistant

## 2024-09-26 ENCOUNTER — Ambulatory Visit: Admitting: Physician Assistant

## 2024-09-26 VITALS — BP 110/70 | HR 107 | Resp 20 | Ht 59.0 in | Wt 97.0 lb

## 2024-09-26 DIAGNOSIS — G309 Alzheimer's disease, unspecified: Secondary | ICD-10-CM | POA: Diagnosis not present

## 2024-09-26 DIAGNOSIS — F0280 Dementia in other diseases classified elsewhere without behavioral disturbance: Secondary | ICD-10-CM | POA: Diagnosis not present

## 2024-09-26 MED ORDER — MEMANTINE HCL 10 MG PO TABS: ORAL_TABLET | ORAL | 3 refills | Status: AC

## 2024-09-26 NOTE — Patient Instructions (Signed)
 It was a pleasure to see you today at our office.   Recommendations:  Follow up in 6 months  Memantine  10 mg  twice daily.  Continue the other medications as per primary doctor Continue B12 supplements         RECOMMENDATIONS FOR ALL PATIENTS WITH MEMORY PROBLEMS: 1. Continue to exercise (Recommend 30 minutes of walking everyday, or 3 hours every week) 2. Increase social interactions - continue going to [place] and enjoy social gatherings with friends and family 3. Eat healthy, avoid fried foods and eat more fruits and vegetables 4. Maintain adequate blood pressure, blood sugar, and blood cholesterol level. Reducing the risk of stroke and cardiovascular disease also helps promoting better memory. 5. Avoid stressful situations. Live a simple life and avoid aggravations. Organize your time and prepare for the next day in anticipation. 6. Sleep well, avoid any interruptions of sleep and avoid any distractions in the bedroom that may interfere with adequate sleep quality 7. Avoid sugar, avoid sweets as there is a strong link between excessive sugar intake, diabetes, and cognitive impairment We discussed the Mediterranean diet, which has been shown to help patients reduce the risk of progressive memory disorders and reduces cardiovascular risk. This includes eating fish, eat fruits and green leafy vegetables, nuts like almonds and hazelnuts, walnuts, and also use olive oil. Avoid fast foods and fried foods as much as possible. Avoid sweets and sugar as sugar use has been linked to worsening of memory function.  There is always a concern of gradual progression of memory problems. If this is the case, then we may need to adjust level of care according to patient needs. Support, both to the patient and caregiver, should then be put into place.    The Alzheimer's Association is here all day, every day for people facing Alzheimer's disease through our free 24/7 Helpline: [phone]. The Helpline  provides reliable information and support to all those who need assistance, such as individuals living with memory loss, Alzheimer's or other dementia, caregivers, health care professionals and the public.  Our highly trained and knowledgeable staff can help you with: Understanding memory loss, dementia and Alzheimer's  Medications and other treatment options  General information about aging and brain health  Skills to provide quality care and to find the best care from professionals  Legal, financial and living-arrangement decisions Our Helpline also features: Confidential care consultation provided by master's level clinicians who can help with decision-making support, crisis assistance and education on issues families face every day  Help in a caller's preferred language using our translation service that features more than 200 languages and dialects  Referrals to local community programs, services and ongoing support     FALL PRECAUTIONS: Be cautious when walking. Scan the area for obstacles that may increase the risk of trips and falls. When getting up in the mornings, sit up at the edge of the bed for a few minutes before getting out of bed. Consider elevating the bed at the head end to avoid drop of blood pressure when getting up. Walk always in a well-lit room (use night lights in the walls). Avoid area rugs or power cords from appliances in the middle of the walkways. Use a walker or a cane if necessary and consider physical therapy for balance exercise. Get your eyesight checked regularly.  FINANCIAL OVERSIGHT: Supervision, especially oversight when making financial decisions or transactions is also recommended.  HOME SAFETY: Consider the safety of the kitchen when operating appliances like stoves, microwave  oven, and blender. Consider having supervision and share cooking responsibilities until no longer able to participate in those. Accidents with firearms and other hazards in the house  should be identified and addressed as well.   ABILITY TO BE LEFT ALONE: If patient is unable to contact 911 operator, consider using LifeLine, or when the need is there, arrange for someone to stay with patients. Smoking is a fire hazard, consider supervision or cessation. Risk of wandering should be assessed by caregiver and if detected at any point, supervision and safe proof recommendations should be instituted.  MEDICATION SUPERVISION: Inability to self-administer medication needs to be constantly addressed. Implement a mechanism to ensure safe administration of the medications.   DRIVING: Regarding driving, in patients with progressive memory problems, driving will be impaired. We advise to have someone else do the driving if trouble finding directions or if minor accidents are reported. Independent driving assessment is available to determine safety of driving.   If you are interested in the driving assessment, you can contact the following:  [facility] [employer] in [place] [phone]  [facility] [phone]  [facility] [phone] or [phone]      Mediterranean Diet A Mediterranean diet refers to food and lifestyle choices that are based on the traditions of countries located on the [employer] [place]. This way of eating has been shown to help prevent certain conditions and improve outcomes for people who have chronic diseases, like kidney disease and heart disease. What are tips for following this plan? Lifestyle  Cook and eat meals together with your family, when possible. Drink enough fluid to keep your urine clear or pale yellow. Be physically active every day. This includes: Aerobic exercise like running or swimming. Leisure activities like gardening, walking, or housework. Get 7-8 hours of sleep each night. If recommended by your health care provider, drink red wine in moderation. This means 1  glass a day for nonpregnant women and 2 glasses a day for men. A glass of wine equals 5 oz (150 mL). Reading food labels  Check the serving size of packaged foods. For foods such as rice and pasta, the serving size refers to the amount of cooked product, not dry. Check the total fat in packaged foods. Avoid foods that have saturated fat or trans fats. Check the ingredients list for added sugars, such as corn syrup. Shopping  At the grocery store, buy most of your food from the areas near the walls of the store. This includes: Fresh fruits and vegetables (produce). Grains, beans, nuts, and seeds. Some of these may be available in unpackaged forms or large amounts (in bulk). Fresh seafood. Poultry and eggs. Low-fat dairy products. Buy whole ingredients instead of prepackaged foods. Buy fresh fruits and vegetables in-season from local farmers markets. Buy frozen fruits and vegetables in resealable bags. If you do not have access to quality fresh seafood, buy precooked frozen shrimp or canned fish, such as tuna, salmon, or sardines. Buy small amounts of raw or cooked vegetables, salads, or olives from the deli or salad bar at your store. Stock your pantry so you always have certain foods on hand, such as olive oil, canned tuna, canned tomatoes, rice, pasta, and beans. Cooking  Cook foods with extra-virgin olive oil instead of using butter or other vegetable oils. Have meat as a side dish, and have vegetables or grains as your main dish. This means having meat in small portions or adding small amounts of meat to  foods like pasta or stew. Use beans or vegetables instead of meat in common dishes like chili or lasagna. Experiment with different cooking methods. Try roasting or broiling vegetables instead of steaming or sauteing them. Add frozen vegetables to soups, stews, pasta, or rice. Add nuts or seeds for added healthy fat at each meal. You can add these to yogurt, salads, or vegetable  dishes. Marinate fish or vegetables using olive oil, lemon juice, garlic, and fresh herbs. Meal planning  Plan to eat 1 vegetarian meal one day each week. Try to work up to 2 vegetarian meals, if possible. Eat seafood 2 or more times a week. Have healthy snacks readily available, such as: Vegetable sticks with hummus. Greek yogurt. Fruit and nut trail mix. Eat balanced meals throughout the week. This includes: Fruit: 2-3 servings a day Vegetables: 4-5 servings a day Low-fat dairy: 2 servings a day Fish, poultry, or lean meat: 1 serving a day Beans and legumes: 2 or more servings a week Nuts and seeds: 1-2 servings a day Whole grains: 6-8 servings a day Extra-virgin olive oil: 3-4 servings a day Limit red meat and sweets to only a few servings a month What are my food choices? Mediterranean diet Recommended Grains: Whole-grain pasta. Brown rice. Bulgar wheat. Polenta. Couscous. Whole-wheat bread. [place] Made[place]. Vegetables: Artichokes. Beets. Broccoli. Cabbage. Carrots. Eggplant. Green beans. Chard. Kale. Spinach. Onions. Leeks. Peas. Squash. Tomatoes. Peppers. Radishes. Fruits: Apples. Apricots. Avocado. Berries. Bananas. Cherries. Dates. Figs. Grapes. Lemons. Melon. Oranges. Peaches. Plums. Pomegranate. Meats and other protein foods: Beans. Almonds. Sunflower seeds. Pine nuts. Peanuts. Cod. Salmon. Scallops. Shrimp. Tuna. Tilapia. Clams. Oysters. Eggs. Dairy: Low-fat milk. Cheese. Greek yogurt. Beverages: Water. Red wine. Herbal tea. Fats and oils: Extra virgin olive oil. Avocado oil. Grape seed oil. Sweets and desserts: Greek yogurt with honey. Baked apples. Poached pears. Trail mix. Seasoning and other foods: Basil. Cilantro. Coriander. Cumin. Mint. Parsley. Sage. Rosemary. Tarragon. Garlic. Oregano. Thyme. Pepper. Balsalmic vinegar. Tahini. Hummus. Tomato sauce. Olives. Mushrooms. Limit these Grains: Prepackaged pasta or rice dishes. Prepackaged cereal with added  sugar. Vegetables: Deep fried potatoes (french fries). Fruits: Fruit canned in syrup. Meats and other protein foods: Beef. Pork. Lamb. Poultry with skin. Hot dogs. [place]. Dairy: Ice cream. Sour cream. Whole milk. Beverages: Juice. Sugar-sweetened soft drinks. Beer. Liquor and spirits. Fats and oils: Butter. Canola oil. Vegetable oil. Beef fat (tallow). Lard. Sweets and desserts: Cookies. Cakes. Pies. Candy. Seasoning and other foods: Mayonnaise. Premade sauces and marinades. The items listed may not be a complete list. Talk with your dietitian about what dietary choices are right for you. Summary The Mediterranean diet includes both food and lifestyle choices. Eat a variety of fresh fruits and vegetables, beans, nuts, seeds, and whole grains. Limit the amount of red meat and sweets that you eat. Talk with your health care provider about whether it is safe for you to drink red wine in moderation. This means 1 glass a day for nonpregnant women and 2 glasses a day for men. A glass of wine equals 5 oz (150 mL). This information is not intended to replace advice given to you by your health care provider. Make sure you discuss any questions you have with your health care provider. Document Released: 06/17/2016 Document Revised: 07/20/2016 Document Reviewed: 06/17/2016 Elsevier Interactive Patient Education  2017 [employer].   We have sent a referral to [facility] for your MRI and they will call you directly to schedule your appointment. They are located at [address] If you need to contact them directly please call [phone].

## 2024-10-13 ENCOUNTER — Ambulatory Visit
Admission: EM | Admit: 2024-10-13 | Discharge: 2024-10-13 | Disposition: A | Discharge status: Home / Self Care | Attending: Internal Medicine | Admitting: Internal Medicine

## 2024-10-13 DIAGNOSIS — S81801A Unspecified open wound, right lower leg, initial encounter: Secondary | ICD-10-CM | POA: Diagnosis not present

## 2024-10-13 DIAGNOSIS — W6199XA Other contact with other birds, initial encounter: Secondary | ICD-10-CM | POA: Diagnosis not present

## 2024-10-13 MED ORDER — DOXYCYCLINE HYCLATE 100 MG PO CAPS: 100.0000 mg | ORAL_CAPSULE | Freq: Two times a day (BID) | ORAL | 0 refills | Status: AC

## 2024-10-13 MED ORDER — MUPIROCIN 2 % EX OINT: 1.0000 | TOPICAL_OINTMENT | Freq: Two times a day (BID) | CUTANEOUS | 1 refills | Status: DC

## 2024-10-13 NOTE — Discharge Instructions (Addendum)
 Right lower extremity lacerations x 3.  These do not appear to need any kind of suture.  The areas have been cleansed today and dressings applied.  Recommend the following: Doxycycline  100 mg twice daily for 7 days.  This is an antibiotic.  Take this with food.  Use caution as this medication can make you sensitive to the sun. Dress the area at least once daily with mupirocin  ointment and a dry dressing.  Can do this twice daily if needed.  Will likely need to do this for at least 7 days.  May discontinue dressings once the areas are dry or scabbed. If you have increasing pain, redness, purulent drainage then return to urgent care for further evaluation

## 2024-10-13 NOTE — ED Provider Notes (Signed)
 [name]    CSN: [id number] Arrival date & time: 10/13/24  1148      History   Chief Complaint Chief Complaint  Patient presents with   Rooster Attack    HPI Erica Carter is a 89 y.o. female.   89 year old female who is brought to urgent care secondary to lacerations on the right lower leg.  This occurred today after she was attacked by a rooster.  They do not know who the rooster belongs to.  She has 3 different areas on the right leg.  She did not have any other injury.  Her last tetanus was this year.     Past Medical History:  Diagnosis Date   Acute gastritis without mention of hemorrhage    Dementia (HCC)    Diabetes mellitus without complication (HCC)    diet control, per pt   Diverticulosis of colon (without mention of hemorrhage)    Esophageal dysmotilities    Esophageal reflux    Hiatal hernia    Other and unspecified hyperlipidemia    Personal history of colonic polyps 09/11/2010   hyperplasic   Skin cancer    Stricture and stenosis of esophagus    Ulcerative colitis, unspecified    Unspecified essential hypertension    Wears dentures    upper and lower   Wears glasses     Patient Active Problem List   Diagnosis Date Noted   Hiatal hernia    Gastric polyp    Status post epidural steroid injection 08/04/2017   Unilateral primary osteoarthritis, left hip 06/17/2017   History of gastroesophageal reflux (GERD) 10/26/2011   GASTRITIS, ACUTE W/O HEMORRHAGE 09/11/2010   HYPERLIPIDEMIA 02/12/2008   Essential hypertension 02/12/2008   GERD 02/12/2008   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   HIATAL HERNIA 05/17/2006   COLITIS, ULCERATIVE 05/17/2006   DIVERTICULOSIS, COLON 05/17/2006    Past Surgical History:  Procedure Laterality Date   BRAVO PH STUDY N/A 09/07/2019   Procedure: BRAVO PH STUDY;  Surgeon: San Sandor GAILS, DO;  Location: [facility];  Service: Gastroenterology;  Laterality: N/A;   BUNIONECTOMY     Bil   CATARACT  EXTRACTION Bilateral    ESOPHAGEAL MANOMETRY N/A 08/08/2019   Procedure: ESOPHAGEAL MANOMETRY (EM);  Surgeon: Shila Gustav GAILS, MD;  Location: [facility];  Service: Endoscopy;  Laterality: N/A;   ESOPHAGOGASTRODUODENOSCOPY (EGD) WITH PROPOFOL  N/A 09/07/2019   Procedure: ESOPHAGOGASTRODUODENOSCOPY (EGD) WITH PROPOFOL ;  Surgeon: San Sandor GAILS, DO;  Location: [facility];  Service: Gastroenterology;  Laterality: N/A;   HEMOSTASIS CLIP PLACEMENT  09/07/2019   Procedure: HEMOSTASIS CLIP PLACEMENT;  Surgeon: San Sandor GAILS, DO;  Location: [facility];  Service: Gastroenterology;;   NASAL SINUS SURGERY     POLYPECTOMY  09/07/2019   Procedure: POLYPECTOMY;  Surgeon: San Sandor GAILS, DO;  Location: [facility];  Service: Gastroenterology;;   THUMB ARTHROSCOPY Left    TUBAL LIGATION      OB History   No obstetric history on file.      Home Medications    Prior to Admission medications   Medication Sig Start Date End Date Taking? Authorizing Provider  amLODipine (NORVASC) 5 MG tablet Take 5 mg by mouth daily.    [provider]  aspirin 81 MG tablet Take 81 mg by mouth daily.    [provider]  atorvastatin (LIPITOR) 20 MG tablet Take 20 mg by mouth daily.     [provider]  calcium gluconate 500 MG tablet Take 500 mg by mouth  daily.      [provider]  cholecalciferol (VITAMIN D) 1000 UNITS tablet Take 1,000 Units by mouth daily.      [provider]  Cyanocobalamin  (VITAMIN B-12 IJ) Inject 1,000 mcg as directed every 30 (thirty) days. Pt gets shot once a month     [provider]  losartan (COZAAR) 100 MG tablet Take 100 mg by mouth daily.      [provider]  memantine  (NAMENDA ) 10 MG tablet Take 1 tablet  twice a day 09/26/24   Wertman, Sara E, PA-C  mesalamine  (LIALDA ) 1.2 g EC tablet Take 2 tablets (2.4 g total) by mouth daily with breakfast. 09/10/24   [place], PA-C  Misc Natural Products (COLON  CARE PO) Take 1 tablet by mouth daily as needed (constipation).     [provider]  Multiple Vitamins-Minerals (CENTRUM SILVER PO) Take 1 tablet by mouth daily.      [provider]  naproxen  (NAPROSYN ) 500 MG tablet Take 1 tablet (500 mg total) by mouth 2 (two) times daily. 07/13/24   Barrett, Warren SAILOR, PA-C  ondansetron  (ZOFRAN -ODT) 4 MG disintegrating tablet Take 1 tablet (4 mg total) by mouth every 8 (eight) hours as needed for nausea or vomiting. 07/13/24   Barrett, Jamie N, PA-C  pantoprazole  (PROTONIX ) 40 MG tablet Take 1 tablet (40 mg total) by mouth daily before breakfast. 09/10/24 09/05/25  [place], PA-C  sertraline (ZOLOFT) 50 MG tablet SMARTSIG:1 Tablet(s) By Mouth Every Evening 06/26/22   [provider]  tamsulosin  (FLOMAX ) 0.4 MG CAPS capsule Take 1 capsule (0.4 mg total) by mouth daily after supper. 07/13/24   Barrett, Jamie N, PA-C  traZODone (DESYREL) 50 MG tablet Take 50 mg by mouth at bedtime as needed. 08/23/22   [provider]  vitamin B-12 (CYANOCOBALAMIN ) 500 MCG tablet Take 500 mcg by mouth daily.    [provider]    Family History Family History  Problem Relation Age of Onset   Thyroid  cancer Brother        mets to brain   Heart disease Brother    Diabetes Mother    Diabetes Sister    Heart disease Father    Heart disease Sister    Heart disease Brother    Heart disease Brother    Heart disease Brother    Colon cancer Neg Hx    Inflammatory bowel disease Neg Hx    Liver disease Neg Hx    Stomach cancer Neg Hx    Pancreatic cancer Neg Hx     Social History Social History   Tobacco Use   Smoking status: Former   Smokeless tobacco: Never   Tobacco comments:    stopped over 20 years  Vaping Use   Vaping status: Never Used  Substance Use Topics   Alcohol use: No   Drug use: No     Allergies   Codeine   Review of Systems Review of Systems  Constitutional:  Negative for chills and fever.  HENT:   Negative for ear pain and sore throat.   Eyes:  Negative for pain and visual disturbance.  Respiratory:  Negative for cough and shortness of breath.   Cardiovascular:  Negative for chest pain and palpitations.  Gastrointestinal:  Negative for abdominal pain and vomiting.  Genitourinary:  Negative for dysuria and hematuria.  Musculoskeletal:  Negative for arthralgias and back pain.  Skin:  Negative for color change and rash.  Neurological:  Negative for seizures and  syncope.  All other systems reviewed and are negative.    Physical Exam Triage Vital Signs ED Triage Vitals  Encounter Vitals Group     BP 10/13/24 1343 (!) 144/80     Girls Systolic BP Percentile --      Girls Diastolic BP Percentile --      Boys Systolic BP Percentile --      Boys Diastolic BP Percentile --      Pulse Rate 10/13/24 1343 74     Resp 10/13/24 1343 16     Temp 10/13/24 1343 97.7 F (36.5 C)     Temp Source 10/13/24 1343 Oral     SpO2 10/13/24 1343 95 %     Weight 10/13/24 1342 97 lb (44 kg)     Height 10/13/24 1342 4' 11 (1.499 m)     Head Circumference --      Peak Flow --      Pain Score 10/13/24 1338 0     Pain Loc --      Pain Education --      Exclude from Growth Chart --    No data found.  Updated Vital Signs BP (!) 144/80 (BP Location: Left Arm)   Pulse 74   Temp 97.7 F (36.5 C) (Oral)   Resp 16   Ht 4' 11 (1.499 m)   Wt 97 lb (44 kg)   SpO2 95%   BMI 19.59 kg/m   Visual Acuity Right Eye Distance:   Left Eye Distance:   Bilateral Distance:    Right Eye Near:   Left Eye Near:    Bilateral Near:     Physical Exam Vitals and nursing note reviewed.  Constitutional:      General: She is not in acute distress.    Appearance: She is well-developed.  HENT:     Head: Normocephalic and atraumatic.  Eyes:     Conjunctiva/sclera: Conjunctivae normal.  Cardiovascular:     Rate and Rhythm: Normal rate and regular rhythm.     Heart sounds: No murmur heard. Pulmonary:      Effort: Pulmonary effort is normal. No respiratory distress.     Breath sounds: Normal breath sounds.  Abdominal:     Palpations: Abdomen is soft.     Tenderness: There is no abdominal tenderness.  Musculoskeletal:        General: No swelling.     Cervical back: Neck supple.  Skin:    General: Skin is warm and dry.     Capillary Refill: Capillary refill takes less than 2 seconds.      Neurological:     Mental Status: She is alert.  Psychiatric:        Mood and Affect: Mood normal.      UC Treatments / Results  Labs (all labs ordered are listed, but only abnormal results are displayed) Labs Reviewed - No data to display  EKG   Radiology No results found.  Procedures Procedures (including critical care time)  Medications Ordered in UC Medications - No data to display  Initial Impression / Assessment and Plan / UC Course  I have reviewed the triage vital signs and the nursing notes.  Pertinent labs & imaging results that were available during my care of the patient were reviewed by me and considered in my medical decision making (see chart for details).     Peck by bird, initial encounter  Open wound of right lower leg, initial encounter   Right lower extremity lacerations  x 3.  These do not appear to need any kind of suture.  The areas have been cleansed today and dressings applied.  Tetanus up-to-date.  Recommend the following: Doxycycline  100 mg twice daily for 7 days.  This is an antibiotic.  Take this with food.  Use caution as this medication can make you sensitive to the sun. Dress the area at least once daily with mupirocin  ointment and a dry dressing.  Can do this twice daily if needed.  Will likely need to do this for at least 7 days.  May discontinue dressings once the areas are dry or scabbed. If you have increasing pain, redness, purulent drainage then return to urgent care for further evaluation  Final Clinical Impressions(s) / UC Diagnoses   Final  diagnoses:  Miriam by bird, initial encounter  Open wound of right lower leg, initial encounter     Discharge Instructions      Right lower extremity lacerations x 3.  These do not appear to need any kind of suture.  The areas have been cleansed today and dressings applied.  Recommend the following: Doxycycline  100 mg twice daily for 7 days.  This is an antibiotic.  Take this with food.  Use caution as this medication can make you sensitive to the sun. Dress the area at least once daily with mupirocin  ointment and a dry dressing.  Can do this twice daily if needed.  Will likely need to do this for at least 7 days.  May discontinue dressings once the areas are dry or scabbed. If you have increasing pain, redness, purulent drainage then return to urgent care for further evaluation    ED Prescriptions   None    PDMP not reviewed this encounter.   Teresa Almarie LABOR, PA-C 10/13/24 1413

## 2024-10-13 NOTE — ED Triage Notes (Signed)
 Patient reports getting attacked by Rooster this morning while going out and getting paper. Getting her 3x on the right lower leg (punctures/scratches).

## 2024-11-03 NOTE — Progress Notes (Signed)
 "  Subjective   Erica Carter is a 89 y.o. (DOB 12-11-1934) female.  No chief complaint on file.    History of Present Illness The patient is an 89 year old female who presents for evaluation of cough and congestion, symptoms started today.. She is accompanied by her daughter.  She reports experiencing shortness of breath, which she attributes to recent exposure to influenza. She was seen here yesterday. She began exhibiting symptoms of cough and congestion today. Her oxygen saturation level was recorded at 91. She has no history of smoking or asthma. She has not been administered any medication for her current symptoms. Her daughter notes that she has been predominantly sleeping throughout the day and complains of mild body aches.  SOCIAL HISTORY She does not smoke.   Reviewed and updated this visit by provider: Tobacco  Allergies  Meds  Problems  Med Hx  Surg Hx  Fam Hx           Past Medical History:  Diagnosis Date   Hyperlipidemia    Hypertension     Allergies[1]   History reviewed. No pertinent surgical history.  Family History[2]  Social History[3]   Review of Systems Review of Systems - All systems reviewed and are negative except what is noted in the HPI.   Objective   Vitals:   11/03/24 1704 11/03/24 1705  BP: 126/74   Pulse: 97   Temp: 98.2 F (36.8 C)   Resp:  18  SpO2: 91% 96%   Physical Exam General Appearance: Normal. Vital signs: On arrival oxygen sat is 91% on room air, neb treatment given in clinic oxygen sats increased to 96% on room air HEENT: Bilateral tympanic membranes are pearly gray. No erythema or effusion. Bilateral external auditory canals are clear. Nasal congestion noted.  Conjunctive and corneas clear.  Posterior oropharynx nonerythematous, uvula midline no PTA no exudate. Cardiac: Regular rate and rhythm no murmur click gallop or rub, S1 and S2 only. Respiratory: Lungs are clear to auscultation bilaterally. No wheezing,  rhonchi, or rales. Skin: Warm and dry, no rash. Neurological: Normal.   Results Laboratory Studies COVID-19 test is negative. Influenza A test is positive.  Results for orders placed or performed in visit on 11/03/24  POCT CoVid-19 nucleic acid   Collection Time: 11/03/24  5:06 PM  Result Value Ref Range   SARS-COV-2, NAA Negative Negative  POCT Flu Nucleic Acid   Collection Time: 11/03/24  5:06 PM  Result Value Ref Range   Influenza A/B A Positive (A) Negative    No results found.   Impression/Plan   Assessment & Plan 1. Influenza A: Acute. Flu A test positive. - Tamiflu 75 mg twice daily for 5 days - Patient education on medication regimen and flu symptoms  2. Cough, congestion, and shortness of breath: Acute. No history of smoking or asthma. - Albuterol  nebulizer administered - Oxygen saturation improved to 97% on room air - COVID test negative  Follow-up - Follow-up visit as needed for symptom management  1. Influenza A (Primary) -     oseltamivir phosphate (TAMIFLU) 75 mg capsule; Take one capsule (75 mg dose) by mouth 2 (two) times daily for 5 days., Starting Sat 11/03/2024, Until Thu 11/08/2024, Normal 2. Cough, unspecified type -     POCT CoVid-19 nucleic acid -     POCT Flu Nucleic Acid 3. Shortness of breath -     albuterol  sulfate (PROVENTIL ) 2.5 mg/3 mL nebulizer solution 2.5 mg; 2.5 mg, Nebulization, Once, On Sat  11/03/24 at 1730, For 1 dose     Risks, benefits, and alternatives of the medications and treatment plan prescribed today were discussed, and patient expressed understanding. Plan follow-up as discussed or as needed if any worsening symptoms or change in condition.      Follow up for PCP, ED if symptoms worsen.  Attestation: Designer, fashion/clothing was used to create visit note. Consent from the patient/caregiver was obtained prior to its use.         [1] Allergies Allergen Reactions   Codeine Other    Unknown rxn  [2] No family  history on file. [3] Social History Socioeconomic History   Marital status: Widowed  Tobacco Use   Smoking status: Never  Substance and Sexual Activity   Alcohol use: Never  "

## 2024-11-12 ENCOUNTER — Emergency Department (HOSPITAL_COMMUNITY)

## 2024-11-12 ENCOUNTER — Observation Stay (HOSPITAL_COMMUNITY)
Admission: EM | Admit: 2024-11-12 | Discharge: 2024-11-13 | Disposition: A | Discharge status: Home / Self Care | Attending: Internal Medicine | Admitting: Internal Medicine

## 2024-11-12 ENCOUNTER — Other Ambulatory Visit: Payer: Self-pay

## 2024-11-12 DIAGNOSIS — E041 Nontoxic single thyroid nodule: Secondary | ICD-10-CM | POA: Insufficient documentation

## 2024-11-12 DIAGNOSIS — R410 Disorientation, unspecified: Secondary | ICD-10-CM

## 2024-11-12 DIAGNOSIS — Z87891 Personal history of nicotine dependence: Secondary | ICD-10-CM | POA: Diagnosis not present

## 2024-11-12 DIAGNOSIS — I10 Essential (primary) hypertension: Secondary | ICD-10-CM | POA: Insufficient documentation

## 2024-11-12 DIAGNOSIS — R918 Other nonspecific abnormal finding of lung field: Secondary | ICD-10-CM | POA: Diagnosis not present

## 2024-11-12 DIAGNOSIS — E119 Type 2 diabetes mellitus without complications: Secondary | ICD-10-CM | POA: Diagnosis not present

## 2024-11-12 DIAGNOSIS — Z79899 Other long term (current) drug therapy: Secondary | ICD-10-CM | POA: Diagnosis not present

## 2024-11-12 DIAGNOSIS — R2689 Other abnormalities of gait and mobility: Secondary | ICD-10-CM | POA: Insufficient documentation

## 2024-11-12 DIAGNOSIS — F0390 Unspecified dementia without behavioral disturbance: Secondary | ICD-10-CM | POA: Diagnosis not present

## 2024-11-12 DIAGNOSIS — E782 Mixed hyperlipidemia: Secondary | ICD-10-CM | POA: Diagnosis not present

## 2024-11-12 DIAGNOSIS — F32A Depression, unspecified: Secondary | ICD-10-CM | POA: Insufficient documentation

## 2024-11-12 DIAGNOSIS — K5190 Ulcerative colitis, unspecified, without complications: Secondary | ICD-10-CM | POA: Insufficient documentation

## 2024-11-12 DIAGNOSIS — Z85820 Personal history of malignant melanoma of skin: Secondary | ICD-10-CM | POA: Insufficient documentation

## 2024-11-12 DIAGNOSIS — K5100 Ulcerative (chronic) pancolitis without complications: Secondary | ICD-10-CM

## 2024-11-12 DIAGNOSIS — G309 Alzheimer's disease, unspecified: Secondary | ICD-10-CM | POA: Insufficient documentation

## 2024-11-12 DIAGNOSIS — R4182 Altered mental status, unspecified: Secondary | ICD-10-CM | POA: Diagnosis present

## 2024-11-12 DIAGNOSIS — K219 Gastro-esophageal reflux disease without esophagitis: Secondary | ICD-10-CM | POA: Diagnosis not present

## 2024-11-12 DIAGNOSIS — E785 Hyperlipidemia, unspecified: Secondary | ICD-10-CM | POA: Insufficient documentation

## 2024-11-12 LAB — CBC WITH DIFFERENTIAL/PLATELET
Abs Immature Granulocytes: 0.03 K/uL (ref 0.00–0.07)
Basophils Absolute: 0 K/uL (ref 0.0–0.1)
Basophils Relative: 0 %
Eosinophils Absolute: 0.2 K/uL (ref 0.0–0.5)
Eosinophils Relative: 3 %
HCT: 45.6 % (ref 36.0–46.0)
Hemoglobin: 14.9 g/dL (ref 12.0–15.0)
Immature Granulocytes: 0 %
Lymphocytes Relative: 25 %
Lymphs Abs: 1.8 K/uL (ref 0.7–4.0)
MCH: 29 pg (ref 26.0–34.0)
MCHC: 32.7 g/dL (ref 30.0–36.0)
MCV: 88.9 fL (ref 80.0–100.0)
Monocytes Absolute: 0.7 K/uL (ref 0.1–1.0)
Monocytes Relative: 9 %
Neutro Abs: 4.7 K/uL (ref 1.7–7.7)
Neutrophils Relative %: 63 %
Platelets: 279 K/uL (ref 150–400)
RBC: 5.13 MIL/uL — ABNORMAL HIGH (ref 3.87–5.11)
RDW: 13.3 % (ref 11.5–15.5)
WBC: 7.5 K/uL (ref 4.0–10.5)
nRBC: 0 % (ref 0.0–0.2)

## 2024-11-12 LAB — COMPREHENSIVE METABOLIC PANEL WITH GFR
ALT: 8 U/L (ref 0–44)
AST: 39 U/L (ref 15–41)
Albumin: 2.9 g/dL — ABNORMAL LOW (ref 3.5–5.0)
Alkaline Phosphatase: 112 U/L (ref 38–126)
Anion gap: 12 (ref 5–15)
BUN: 11 mg/dL (ref 8–23)
CO2: 21 mmol/L — ABNORMAL LOW (ref 22–32)
Calcium: 8.4 mg/dL — ABNORMAL LOW (ref 8.9–10.3)
Chloride: 109 mmol/L (ref 98–111)
Creatinine, Ser: 0.68 mg/dL (ref 0.44–1.00)
GFR, Estimated: >60 mL/min
Glucose, Bld: 96 mg/dL (ref 70–99)
Potassium: 3.8 mmol/L (ref 3.5–5.1)
Sodium: 142 mmol/L (ref 135–145)
Total Bilirubin: 0.7 mg/dL (ref 0.0–1.2)
Total Protein: 5.9 g/dL — ABNORMAL LOW (ref 6.5–8.1)

## 2024-11-12 LAB — URINALYSIS, ROUTINE W REFLEX MICROSCOPIC
Bilirubin Urine: NEGATIVE
Glucose, UA: NEGATIVE mg/dL
Hgb urine dipstick: NEGATIVE
Ketones, ur: NEGATIVE mg/dL
Leukocytes,Ua: NEGATIVE
Nitrite: NEGATIVE
Protein, ur: NEGATIVE mg/dL
Specific Gravity, Urine: 1.019 (ref 1.005–1.030)
pH: 6 (ref 5.0–8.0)

## 2024-11-12 LAB — CBG MONITORING, ED: Glucose-Capillary: 99 mg/dL (ref 70–99)

## 2024-11-12 MED ORDER — IOHEXOL 350 MG/ML SOLN
50.0000 mL | Freq: Once | INTRAVENOUS | Status: AC | PRN
  Administered 2024-11-12: 50 mL via INTRAVENOUS

## 2024-11-12 NOTE — H&P (Signed)
 " History and Physical    Patient: Erica Carter FMW:[id number] DOB: [birth year] DOA: 11/12/2024 DOS: the patient was seen and examined on 11/12/2024 PCP: Loreli Kins, MD  Patient coming from: Home  Chief Complaint:  Chief Complaint  Patient presents with   Altered Mental Status   Cough   HPI: Erica Carter is a 89 y.o. female with medical history significant of hypertension, hyperlipidemia, GERD, dementia who presents to the emergency department via EMS due to altered mental status.  Patient has dementia at baseline, but was able to maintain normal communication and able to take care of most ADLs.  She was recently diagnosed with influenza A on close status 12/27 and was treated with Tamiflu for 5 days.  Today she presented with increased confusion when patient took off the thermostat off the wall (unusual for patient)..  She had no complaints of burning sensation on urination, fever, abdominal pain or vomiting  ED course In the emergency department, BP was 191/86, temperature 97.5 F, other vital signs were within normal range.  Workup in the ED showed normal CBC and BMP except for bicarb of 21.  Albumin 2.9.  Urinalysis was normal. CT head without contrast showed no acute intracranial abnormality CT chest with contrast showed no acute findings IV hydration was provided.  Haldol  2 mg was Given Due To agitation  Review of Systems: As mentioned in the history of present illness. All other systems reviewed and are negative. Past Medical History:  Diagnosis Date   Acute gastritis without mention of hemorrhage    Dementia (HCC)    Diabetes mellitus without complication (HCC)    diet control, per pt   Diverticulosis of colon (without mention of hemorrhage)    Esophageal dysmotilities    Esophageal reflux    Hiatal hernia    Other and unspecified hyperlipidemia    Personal history of colonic polyps 09/11/2010   hyperplasic   Skin cancer    Stricture and stenosis of esophagus     Ulcerative colitis, unspecified    Unspecified essential hypertension    Wears dentures    upper and lower   Wears glasses    Past Surgical History:  Procedure Laterality Date   BRAVO PH STUDY N/A 09/07/2019   Procedure: BRAVO PH STUDY;  Surgeon: San Sandor GAILS, DO;  Location: [facility];  Service: Gastroenterology;  Laterality: N/A;   BUNIONECTOMY     Bil   CATARACT EXTRACTION Bilateral    ESOPHAGEAL MANOMETRY N/A 08/08/2019   Procedure: ESOPHAGEAL MANOMETRY (EM);  Surgeon: Shila Gustav GAILS, MD;  Location: [facility];  Service: Endoscopy;  Laterality: N/A;   ESOPHAGOGASTRODUODENOSCOPY (EGD) WITH PROPOFOL  N/A 09/07/2019   Procedure: ESOPHAGOGASTRODUODENOSCOPY (EGD) WITH PROPOFOL ;  Surgeon: San Sandor GAILS, DO;  Location: [facility];  Service: Gastroenterology;  Laterality: N/A;   HEMOSTASIS CLIP PLACEMENT  09/07/2019   Procedure: HEMOSTASIS CLIP PLACEMENT;  Surgeon: San Sandor GAILS, DO;  Location: [facility];  Service: Gastroenterology;;   NASAL SINUS SURGERY     POLYPECTOMY  09/07/2019   Procedure: POLYPECTOMY;  Surgeon: San Sandor GAILS, DO;  Location: [facility];  Service: Gastroenterology;;   THUMB ARTHROSCOPY Left    TUBAL LIGATION     Social History:  reports that she has quit smoking. She has never used smokeless tobacco. She reports that she does not drink alcohol and does not use drugs.  Allergies[1]  Family History  Problem Relation Age of Onset   Thyroid  cancer Brother  mets to brain   Heart disease Brother    Diabetes Mother    Diabetes Sister    Heart disease Father    Heart disease Sister    Heart disease Brother    Heart disease Brother    Heart disease Brother    Colon cancer Neg Hx    Inflammatory bowel disease Neg Hx    Liver disease Neg Hx    Stomach cancer Neg Hx    Pancreatic cancer Neg Hx     Prior to Admission medications  Medication Sig Start Date End Date Taking? Authorizing Provider  amLODipine  (NORVASC ) 5 MG  tablet Take 5 mg by mouth daily.    [provider]  aspirin 81 MG tablet Take 81 mg by mouth daily.    [provider]  atorvastatin  (LIPITOR) 20 MG tablet Take 20 mg by mouth daily.     [provider]  calcium  gluconate 500 MG tablet Take 500 mg by mouth daily.      [provider]  cholecalciferol (VITAMIN D) 1000 UNITS tablet Take 1,000 Units by mouth daily.      [provider]  Cyanocobalamin  (VITAMIN B-12 IJ) Inject 1,000 mcg as directed every 30 (thirty) days. Pt gets shot once a month     [provider]  losartan  (COZAAR ) 100 MG tablet Take 100 mg by mouth daily.      [provider]  memantine  (NAMENDA ) 10 MG tablet Take 1 tablet  twice a day 09/26/24   Wertman, Sara E, PA-C  mesalamine  (LIALDA ) 1.2 g EC tablet Take 2 tablets (2.4 g total) by mouth daily with breakfast. 09/10/24   [place], PA-C  Misc Natural Products (COLON CARE PO) Take 1 tablet by mouth daily as needed (constipation).     [provider]  Multiple Vitamins-Minerals (CENTRUM SILVER PO) Take 1 tablet by mouth daily.      [provider]  mupirocin  ointment (BACTROBAN ) 2 % Apply 1 Application topically 2 (two) times daily. 10/13/24   White, Elizabeth A, PA-C  naproxen  (NAPROSYN ) 500 MG tablet Take 1 tablet (500 mg total) by mouth 2 (two) times daily. 07/13/24   Barrett, Jamie N, PA-C  ondansetron  (ZOFRAN -ODT) 4 MG disintegrating tablet Take 1 tablet (4 mg total) by mouth every 8 (eight) hours as needed for nausea or vomiting. 07/13/24   Barrett, Jamie N, PA-C  pantoprazole  (PROTONIX ) 40 MG tablet Take 1 tablet (40 mg total) by mouth daily before breakfast. 09/10/24 09/05/25  [place], PA-C  sertraline  (ZOLOFT ) 50 MG tablet SMARTSIG:1 Tablet(s) By Mouth Every Evening 06/26/22   [provider]  tamsulosin  (FLOMAX ) 0.4 MG CAPS capsule Take 1 capsule (0.4 mg total) by mouth daily after supper. 07/13/24   Barrett, Jamie N, PA-C   traZODone (DESYREL) 50 MG tablet Take 50 mg by mouth at bedtime as needed. 08/23/22   [provider]  vitamin B-12 (CYANOCOBALAMIN ) 500 MCG tablet Take 500 mcg by mouth daily.    [provider]    Physical Exam: Vitals:   11/12/24 1704 11/12/24 1706 11/12/24 1706 11/12/24 1945  BP: (!) 191/86   (!) 164/84  Pulse:  78  84  Resp:   16 16  Temp:   (!) 97.5 F (36.4 C) 97.7 F (36.5 C)  TempSrc:    Oral  SpO2:  100%  100%  Weight:      Height:       General: Elderly female. Awake and alert and oriented  to person only. Not in any acute distress.  HEENT: NCAT.  PERRLA. EOMI. Sclerae anicteric.  Moist mucosal membranes. Neck: Neck supple without lymphadenopathy. No carotid bruits. No masses palpated.  Cardiovascular: Regular rate with normal S1-S2 sounds. No murmurs, rubs or gallops auscultated. No JVD.  Respiratory: Clear breath sounds.  No accessory muscle use. Abdomen: Soft, nontender, nondistended. Active bowel sounds. No masses or hepatosplenomegaly  Skin: No rashes, lesions, or ulcerations.  Dry, warm to touch. Musculoskeletal:  2+ dorsalis pedis and radial pulses. Good ROM.  No contractures  Psychiatric: Intact judgment and insight.  Mood appropriate to current condition. Neurologic: No focal neurological deficits. Strength is 5/5 x 4.  CN II - XII grossly intact.  Assessment and Plan: Altered mental status Patient presents with increased confusion due to unknown cause Fall precaution, delirium precaution Provide gentle hydration  Essential hypertension Continue losartan , amlodipine   Mixed hyperlipidemia Continue Lipitor  Dementia Continue Namenda   Depression-Zoloft  GERD-Protonix  Ulcerative colitis -mesalamine    Advance Care Planning: Full code  Consults: None  Family Communication: Daughter at bedside  Severity of Illness: The appropriate patient status for this patient is OBSERVATION. Observation status is judged to be reasonable and  necessary in order to provide the required intensity of service to ensure the patient's safety. The patient's presenting symptoms, physical exam findings, and initial radiographic and laboratory data in the context of their medical condition is felt to place them at decreased risk for further clinical deterioration. Furthermore, it is anticipated that the patient will be medically stable for discharge from the hospital within 2 midnights of admission.   Author: Eason Housman, DO 11/12/2024 10:53 PM  For on call review [email or web address].      [1]  Allergies Allergen Reactions   Codeine     REACTION: Mouth swells Other reaction(s): Other (See Comments) Unknown rxn   "

## 2024-11-12 NOTE — ED Triage Notes (Signed)
 PT BIB [facility] d/t onset AMS from baseline Family reports dementia at baseline. Recently had antibiotics for flu  Left lower lobe rhonchi  wet cough per ems.  97% on room air 22 rr 133 CBG 76 heart rate 140/70 BP

## 2024-11-12 NOTE — ED Provider Notes (Signed)
 " [facility] Provider Note   CSN: [id number] Arrival date & time: 11/12/24  1646     History  Chief Complaint  Patient presents with   Altered Mental Status   Cough    Erica Carter is a 89 y.o. female with Alzheimer's dementia, UC, GERD, T2DM, HTN, HLD, CKD 3, h/o melanoma s/p resection, GERD, B12 deficiency who presents BIB [facility] d/t onset AMS from baseline. Family reports dementia at baseline. Recently had antibiotics for flu. Left lower lobe rhonchi and wet cough per ems. Per chart review she was taking tamiflu for positive diagnosis of influenza A starting on 11/03/24.   Granddaughter at bedside Erica Carter reports increasing confusion. She couldn't figure out what to do with stamped mail. Had taken thermostat off the wall as well. Not normal for her. Normally knows where and who she is. Noticed at 3:30 pm. Last saw her acting normally on Thursday. Patient's daughter does live with her and she's on her way. Hasn't mentioned any pain anywhere.   97% on room air 22 rr 133 CBG 76 heart rate 140/70 BP.    Past Medical History:  Diagnosis Date   Acute gastritis without mention of hemorrhage    Dementia (HCC)    Diabetes mellitus without complication (HCC)    diet control, per pt   Diverticulosis of colon (without mention of hemorrhage)    Esophageal dysmotilities    Esophageal reflux    Hiatal hernia    Other and unspecified hyperlipidemia    Personal history of colonic polyps 09/11/2010   hyperplasic   Skin cancer    Stricture and stenosis of esophagus    Ulcerative colitis, unspecified    Unspecified essential hypertension    Wears dentures    upper and lower   Wears glasses        Home Medications Prior to Admission medications  Medication Sig Start Date End Date Taking? Authorizing Provider  amLODipine  (NORVASC ) 5 MG tablet Take 5 mg by mouth daily.    [provider]  aspirin 81 MG tablet Take 81 mg by  mouth daily.    [provider]  atorvastatin  (LIPITOR) 20 MG tablet Take 20 mg by mouth daily.     [provider]  calcium  gluconate 500 MG tablet Take 500 mg by mouth daily.      [provider]  cholecalciferol (VITAMIN D) 1000 UNITS tablet Take 1,000 Units by mouth daily.      [provider]  Cyanocobalamin  (VITAMIN B-12 IJ) Inject 1,000 mcg as directed every 30 (thirty) days. Pt gets shot once a month     [provider]  losartan  (COZAAR ) 100 MG tablet Take 100 mg by mouth daily.      [provider]  memantine  (NAMENDA ) 10 MG tablet Take 1 tablet  twice a day 09/26/24   Wertman, Sara E, PA-C  mesalamine  (LIALDA ) 1.2 g EC tablet Take 2 tablets (2.4 g total) by mouth daily with breakfast. 09/10/24   [place], PA-C  Misc Natural Products (COLON CARE PO) Take 1 tablet by mouth daily as needed (constipation).     [provider]  Multiple Vitamins-Minerals (CENTRUM SILVER PO) Take 1 tablet by mouth daily.      [provider]  mupirocin  ointment (BACTROBAN ) 2 % Apply 1 Application topically 2 (two) times daily. 10/13/24   White, Elizabeth A, PA-C  naproxen  (NAPROSYN ) 500 MG tablet Take 1 tablet (500 mg total) by  mouth 2 (two) times daily. 07/13/24   Barrett, Warren SAILOR, PA-C  ondansetron  (ZOFRAN -ODT) 4 MG disintegrating tablet Take 1 tablet (4 mg total) by mouth every 8 (eight) hours as needed for nausea or vomiting. 07/13/24   Barrett, Jamie N, PA-C  pantoprazole  (PROTONIX ) 40 MG tablet Take 1 tablet (40 mg total) by mouth daily before breakfast. 09/10/24 09/05/25  [place], PA-C  sertraline  (ZOLOFT ) 50 MG tablet SMARTSIG:1 Tablet(s) By Mouth Every Evening 06/26/22   [provider]  tamsulosin  (FLOMAX ) 0.4 MG CAPS capsule Take 1 capsule (0.4 mg total) by mouth daily after supper. 07/13/24   Barrett, Jamie N, PA-C  traZODone (DESYREL) 50 MG tablet Take 50 mg by mouth at bedtime as needed. 08/23/22   [provider]  vitamin B-12 (CYANOCOBALAMIN ) 500 MCG tablet Take 500 mcg by mouth daily.    [provider]      Allergies    Codeine    Review of Systems   Review of Systems A 10 point review of systems was performed and is negative unless otherwise reported in HPI.  Physical Exam Updated Vital Signs BP (!) 164/84 (BP Location: Right Arm)   Pulse 84   Temp 97.7 F (36.5 C) (Oral)   Resp 16   Ht 4' 11 (1.499 m)   Wt 53.5 kg   SpO2 100%   BMI 23.83 kg/m  Physical Exam General: Normal appearing female, lying in bed.  HEENT: NCAT, PERRLA, Sclera anicteric, MMM, trachea midline.  No midline C-spine tenderness palpation deformities or step-offs. Cardiology: RRR, no murmurs/rubs/gallops. BL radial and DP pulses equal bilaterally.  No chest wall tenderness to palpation. Resp: Wet cough noted, diffuse rhonchi. Abd: Soft, non-tender, non-distended. No rebound tenderness or guarding.  Pelvis: Pelvis stable nontender MSK: No peripheral edema or signs of trauma. Extremities without deformity or TTP.  Skin: warm, dry Neuro: A&Ox1, CNs II-XII grossly intact. 5/5 strength all extremities. Sensation grossly intact. Normal speech.  ED Results / Procedures / Treatments   Labs (all labs ordered are listed, but only abnormal results are displayed) Labs Reviewed  URINALYSIS, ROUTINE W REFLEX MICROSCOPIC - Abnormal; Notable for the following components:      Result Value   Color, Urine AMBER (*)    All other components within normal limits  CBC WITH DIFFERENTIAL/PLATELET - Abnormal; Notable for the following components:   RBC 5.13 (*)    All other components within normal limits  COMPREHENSIVE METABOLIC PANEL WITH GFR - Abnormal; Notable for the following components:   CO2 21 (*)    Calcium  8.4 (*)    Total Protein 5.9 (*)    Albumin 2.9 (*)    All other components within normal limits  CBC WITH DIFFERENTIAL/PLATELET  CBG MONITORING, ED    EKG None  Radiology CT Chest  W Contrast Result Date: 11/12/2024 EXAM: CT CHEST WITH CONTRAST 11/12/2024 09:07:00 PM TECHNIQUE: CT of the chest was performed with the administration of 50 mL of iohexol  (OMNIPAQUE ) 350 MG/ML injection. Multiplanar reformatted images are provided for review. Automated exposure control, iterative reconstruction, and/or weight based adjustment of the mA/kV was utilized to reduce the radiation dose to as low as reasonably achievable. COMPARISON: CT chest 09/02/2022. CLINICAL HISTORY: Pneumonia, complication suspected, xray done. FINDINGS: MEDIASTINUM: Inferior left thyroid  nodule measures 15 mm. Coronary and aortic atherosclerotic calcifications are noted. Heart and pericardium are unremarkable. The central airways are clear. LYMPH NODES: No mediastinal, hilar or axillary lymphadenopathy. LUNGS AND PLEURA: Minimal atelectatic changes in both  lung bases. There are a few scattered 1 to 2 mm nodular densities. There is a 3 mm right lower lobe nodule image 3/84 which is unchanged from 2023 and favored as benign. No focal consolidation or pulmonary edema. No pleural effusion or pneumothorax. SOFT TISSUES/BONES: Multilevel degenerative changes affect the spine. No acute abnormality of the bones or soft tissues. UPPER ABDOMEN: Limited images of the upper abdomen demonstrates a 2.9 cm right renal cyst. No acute abnormality. IMPRESSION: 1. No acute findings. 2. 15 mm inferior left thyroid  nodule, recommend non-emergent thyroid  ultrasound. 3. scattered nodular densities measuring 3 mm or less. No routine follow-up imaging is recommended per Fleischner Society Guidelines. Electronically signed by: Greig Pique MD 11/12/2024 09:34 PM EST RP Workstation: [id number]   CT Head Wo Contrast Result Date: 11/12/2024 EXAM: CT HEAD WITHOUT 11/12/2024 06:53:47 PM TECHNIQUE: CT of the head was performed without the administration of intravenous contrast. Automated exposure control, iterative reconstruction, and/or weight based adjustment  of the mA/kV was utilized to reduce the radiation dose to as low as reasonably achievable. COMPARISON: 09/02/2022 CLINICAL HISTORY: Mental status change, unknown cause FINDINGS: BRAIN AND VENTRICLES: No acute intracranial hemorrhage. No mass effect or midline shift. No extra-axial fluid collection. No evidence of acute infarct. No hydrocephalus. Atrophy and chronic small vessel disease throughout the deep white matter. ORBITS: No acute abnormality. SINUSES AND MASTOIDS: No acute abnormality. SOFT TISSUES AND SKULL: No acute skull fracture. No acute soft tissue abnormality. IMPRESSION: 1. No acute intracranial abnormality. 2. Atrophy and chronic small vessel disease throughout the deep white matter. Electronically signed by: Franky Crease MD 11/12/2024 07:33 PM EST RP Workstation: [id number]   DG Chest Portable 1 View Result Date: 11/12/2024 EXAM: 1 VIEW(S) XRAY OF THE CHEST 11/12/2024 05:18:00 PM COMPARISON: None available. CLINICAL HISTORY: ?PNA, AMS FINDINGS: LUNGS AND PLEURA: Elevated right hemidiaphragm. No focal pulmonary opacity. No pleural effusion. No pneumothorax. HEART AND MEDIASTINUM: No acute abnormality of the cardiac and mediastinal silhouettes. BONES AND SOFT TISSUES: Multilevel degenerative changes of thoracolumbar spine. Scoliosis. IMPRESSION: 1. No acute findings. Electronically signed by: Greig Pique MD 11/12/2024 06:23 PM EST RP Workstation: [id number]    Procedures Procedures    Medications Ordered in ED Medications  iohexol  (OMNIPAQUE ) 350 MG/ML injection 50 mL (50 mLs Intravenous Contrast Given 11/12/24 2107)    ED Course/ Medical Decision Making/ A&P                          Medical Decision Making Amount and/or Complexity of Data Reviewed Labs: ordered. Decision-making details documented in ED Course. Radiology: ordered. Decision-making details documented in ED Course.  Risk Prescription drug management. Decision regarding hospitalization.    This patient presents to  the ED for concern of AMS/confusion, this involves an extensive number of treatment options, and is a complaint that carries with it a high risk of complications and morbidity.  I considered the following differential and admission for this acute, potentially life threatening condition.   MDM:    Ddx of acute altered mental status or encephalopathy considered but not limited to: -Intracranial abnormalities such as ICH, hydrocephalus, head trauma - no falls/head trauma reported; CTH neg -Infection such as UTI, PNA - both neg -Polypharmacy - only sedating medication noted is trazodone with no recent med changes -No significant electrolyte abnormalities or hyper/hypoglycemia -No CP/SOB to indicate ACS or arrhythmia -Known underlying dementia; consider worsening -Sxs are reported as intermittent; no other neurologic deficits, consider TIA though seems less likely?  Clinical Course as of 11/12/24 2253  Mon Nov 12, 2024  8146 Urinalysis, Routine w reflex microscopic -Urine, Catheterized(!) neg [HN]  1854 DG Chest Portable 1 View 1. No acute findings. [HN]  2105 CT Head Wo Contrast 1. No acute intracranial abnormality. 2. Atrophy and chronic small vessel disease throughout the deep white matter.   [HN]  2144 CT Chest W Contrast 1. No acute findings. 2. 15 mm inferior left thyroid  nodule, recommend non-emergent thyroid  ultrasound. 3. scattered nodular densities measuring 3 mm or less. No routine follow-up imaging is recommended per Fleischner Society Guidelines.   [HN]  2252 Daughter is now at bedside. Confirms that patient is not acting like herself. Intermittently she seems sort of like herself but then will be grossly abnormal again. No UTI, no PNA on CT chest. No leukocytosis or signs of sepsis. Possible dehydration. Could consider TIA? No other focal neurologic deficits. Also consider worsening of known dementia. Will admit to hospitalist.  [HN]    Clinical Course User Index [HN]  Franklyn Sid SAILOR, MD    Labs: I Ordered, and personally interpreted labs.  The pertinent results include: Those listed above  Imaging Studies ordered: I ordered imaging studies including CT head, chest x-ray I independently visualized and interpreted imaging. I agree with the radiologist interpretation  Additional history obtained from chart review, daughter at bedside.  External records from outside source obtained and reviewed including recent urgent care visit  Cardiac Monitoring: The patient was maintained on a cardiac monitor.  I personally viewed and interpreted the cardiac monitored which showed an underlying rhythm of: Normal sinus rhythm  Reevaluation: After the interventions noted above, I reevaluated the patient and found that they have :stayed the same  Social Determinants of Health:  lives independently with daughter  Disposition:  admit to hospitalist  Co morbidities that complicate the patient evaluation  Past Medical History:  Diagnosis Date   Acute gastritis without mention of hemorrhage    Dementia (HCC)    Diabetes mellitus without complication (HCC)    diet control, per pt   Diverticulosis of colon (without mention of hemorrhage)    Esophageal dysmotilities    Esophageal reflux    Hiatal hernia    Other and unspecified hyperlipidemia    Personal history of colonic polyps 09/11/2010   hyperplasic   Skin cancer    Stricture and stenosis of esophagus    Ulcerative colitis, unspecified    Unspecified essential hypertension    Wears dentures    upper and lower   Wears glasses      Medicines Meds ordered this encounter  Medications   iohexol  (OMNIPAQUE ) 350 MG/ML injection 50 mL    I have reviewed the patients home medicines and have made adjustments as needed  Problem List / ED Course: Problem List Items Addressed This Visit       Other   * (Principal) Altered mental status, unspecified - Primary   Other Visit Diagnoses       Confusion                        This note was created using dictation software, which may contain spelling or grammatical errors.    Franklyn Sid SAILOR, MD 11/12/24 2255  "

## 2024-11-12 NOTE — ED Notes (Signed)
 Patient transported to CT

## 2024-11-12 NOTE — ED Notes (Signed)
 Patient provided with drink, turkey sandwich, and applesauce at this time with provider permission.

## 2024-11-12 NOTE — ED Notes (Signed)
"  Xray at bedside.   "

## 2024-11-13 ENCOUNTER — Observation Stay (HOSPITAL_COMMUNITY)

## 2024-11-13 DIAGNOSIS — R404 Transient alteration of awareness: Secondary | ICD-10-CM

## 2024-11-13 LAB — COMPREHENSIVE METABOLIC PANEL WITH GFR
ALT: 10 U/L (ref 0–44)
AST: 24 U/L (ref 15–41)
Albumin: 3.2 g/dL — ABNORMAL LOW (ref 3.5–5.0)
Alkaline Phosphatase: 104 U/L (ref 38–126)
Anion gap: 10 (ref 5–15)
BUN: 9 mg/dL (ref 8–23)
CO2: 26 mmol/L (ref 22–32)
Calcium: 8.6 mg/dL — ABNORMAL LOW (ref 8.9–10.3)
Chloride: 106 mmol/L (ref 98–111)
Creatinine, Ser: 0.68 mg/dL (ref 0.44–1.00)
GFR, Estimated: >60 mL/min
Glucose, Bld: 120 mg/dL — ABNORMAL HIGH (ref 70–99)
Potassium: 3 mmol/L — ABNORMAL LOW (ref 3.5–5.1)
Sodium: 142 mmol/L (ref 135–145)
Total Bilirubin: 0.6 mg/dL (ref 0.0–1.2)
Total Protein: 5.9 g/dL — ABNORMAL LOW (ref 6.5–8.1)

## 2024-11-13 LAB — PHOSPHORUS: Phosphorus: 2.7 mg/dL (ref 2.5–4.6)

## 2024-11-13 LAB — CBC
HCT: 42.1 % (ref 36.0–46.0)
Hemoglobin: 13.7 g/dL (ref 12.0–15.0)
MCH: 29.4 pg (ref 26.0–34.0)
MCHC: 32.5 g/dL (ref 30.0–36.0)
MCV: 90.3 fL (ref 80.0–100.0)
Platelets: 249 K/uL (ref 150–400)
RBC: 4.66 MIL/uL (ref 3.87–5.11)
RDW: 13.5 % (ref 11.5–15.5)
WBC: 6.9 K/uL (ref 4.0–10.5)
nRBC: 0 % (ref 0.0–0.2)

## 2024-11-13 LAB — MAGNESIUM: Magnesium: 1.9 mg/dL (ref 1.7–2.4)

## 2024-11-13 LAB — VITAMIN B12: Vitamin B-12: 2201 pg/mL — ABNORMAL HIGH (ref 180–914)

## 2024-11-13 MED ORDER — POTASSIUM CHLORIDE CRYS ER 20 MEQ PO TBCR
40.0000 meq | EXTENDED_RELEASE_TABLET | Freq: Once | ORAL | Status: AC
  Administered 2024-11-13: 40 meq via ORAL
  Filled 2024-11-13: qty 2

## 2024-11-13 MED ORDER — AMLODIPINE BESYLATE 5 MG PO TABS
5.0000 mg | ORAL_TABLET | Freq: Every day | ORAL | Status: DC
  Administered 2024-11-13: 5 mg via ORAL
  Filled 2024-11-13: qty 1

## 2024-11-13 MED ORDER — LOSARTAN POTASSIUM 50 MG PO TABS
100.0000 mg | ORAL_TABLET | Freq: Every day | ORAL | Status: DC
  Administered 2024-11-13: 100 mg via ORAL
  Filled 2024-11-13: qty 2

## 2024-11-13 MED ORDER — MEMANTINE HCL 10 MG PO TABS
10.0000 mg | ORAL_TABLET | Freq: Two times a day (BID) | ORAL | Status: DC
  Administered 2024-11-13 (×2): 10 mg via ORAL
  Filled 2024-11-13 (×2): qty 1

## 2024-11-13 MED ORDER — ENOXAPARIN SODIUM 40 MG/0.4ML IJ SOSY
40.0000 mg | PREFILLED_SYRINGE | INTRAMUSCULAR | Status: DC
  Administered 2024-11-13: 40 mg via SUBCUTANEOUS
  Filled 2024-11-13: qty 0.4

## 2024-11-13 MED ORDER — ATORVASTATIN CALCIUM 10 MG PO TABS
20.0000 mg | ORAL_TABLET | Freq: Every day | ORAL | Status: DC
  Administered 2024-11-13: 20 mg via ORAL
  Filled 2024-11-13: qty 2

## 2024-11-13 MED ORDER — ACETAMINOPHEN 650 MG RE SUPP: 650.0000 mg | Freq: Four times a day (QID) | RECTAL | Status: DC | PRN

## 2024-11-13 MED ORDER — ONDANSETRON HCL 4 MG/2ML IJ SOLN: 4.0000 mg | Freq: Four times a day (QID) | INTRAMUSCULAR | Status: DC | PRN

## 2024-11-13 MED ORDER — MESALAMINE 1.2 G PO TBEC
2.4000 g | DELAYED_RELEASE_TABLET | Freq: Every day | ORAL | Status: DC
  Administered 2024-11-13: 2.4 g via ORAL
  Filled 2024-11-13: qty 2

## 2024-11-13 MED ORDER — LACTATED RINGERS IV SOLN: INTRAVENOUS | Status: AC

## 2024-11-13 MED ORDER — ACETAMINOPHEN 325 MG PO TABS: 650.0000 mg | ORAL_TABLET | Freq: Four times a day (QID) | ORAL | Status: DC | PRN

## 2024-11-13 MED ORDER — SERTRALINE HCL 25 MG PO TABS: 50.0000 mg | ORAL_TABLET | Freq: Every day | ORAL | Status: DC

## 2024-11-13 MED ORDER — PANTOPRAZOLE SODIUM 40 MG PO TBEC
40.0000 mg | DELAYED_RELEASE_TABLET | Freq: Every day | ORAL | Status: DC
  Administered 2024-11-13: 40 mg via ORAL
  Filled 2024-11-13: qty 1

## 2024-11-13 MED ORDER — ONDANSETRON HCL 4 MG PO TABS: 4.0000 mg | ORAL_TABLET | Freq: Four times a day (QID) | ORAL | Status: DC | PRN

## 2024-11-13 MED ORDER — HYDROXYZINE HCL 10 MG PO TABS: 10.0000 mg | ORAL_TABLET | Freq: Once | ORAL | Status: DC | PRN

## 2024-11-13 MED ORDER — HALOPERIDOL LACTATE 5 MG/ML IJ SOLN: 2.0000 mg | Freq: Once | INTRAMUSCULAR | Status: DC

## 2024-11-13 NOTE — ED Notes (Signed)
 Pt in bed, pt denies pain, pt has no needs or requests, pt to MRI

## 2024-11-13 NOTE — Care Management Obs Status (Cosign Needed)
 MEDICARE OBSERVATION STATUS NOTIFICATION   Patient Details  Name: BESS SALTZMAN MRN: [id number] Date of Birth: [birth year]   Medicare Observation Status Notification Given:  Yes    Rosaline JONELLE Joe, RN 11/13/2024, 1:44 PM

## 2024-11-13 NOTE — Discharge Summary (Signed)
 "  Physician Discharge Summary  Erica Carter FMW:[id number] DOB: [birth year] DOA: 11/12/2024  PCP: Erica Kins, MD  Admit date: 11/12/2024 Discharge date: 11/13/2024  Admitted From: home Disposition:  home  Recommendations for Outpatient Follow-up:  Follow up with PCP in 1-2 weeks  Home Health: none Equipment/Devices: none  Discharge Condition: stable CODE STATUS: Full code Diet Orders (From admission, onward)     Start     Ordered   11/13/24 0233  Diet Heart Room service appropriate? Yes; Fluid consistency: Thin  Diet effective now       Question Answer Comment  Room service appropriate? Yes   Fluid consistency: Thin      11/13/24 0233            HPI: Per admitting MD, Erica Carter is a 89 y.o. female with medical history significant of hypertension, hyperlipidemia, GERD, dementia who presents to the emergency department via EMS due to altered mental status.  Patient has dementia at baseline, but was able to maintain normal communication and able to take care of most ADLs.  She was recently diagnosed with influenza A on close status 12/27 and was treated with Tamiflu for 5 days.  Today she presented with increased confusion when patient took off the thermostat off the wall (unusual for patient)..  She had no complaints of burning sensation on urination, fever, abdominal pain or vomiting   Hospital Course / Discharge diagnoses: Principal problem Transient altered mental status -patient presented to the hospital with transient confusion.  She has dementia at baseline, however she is able to function well within her house, but the day prior to admission family noticed that she has been having difficulties with simple tasks, such as knowing what to do with a stamped envelope, did not know what her mailbox is and took the thermostat off of the wall and gave it to a family member for safekeeping.  She was brought to the hospital.  Workup was essentially unrevealing, urinalysis did  not show any evidence of infection, chest x-ray was without infiltrates, a CT scan and MRI of the brain were both unremarkable without evidence of acute intracranial processes or acute CVA.  Her vital signs have been unremarkable and she has been afebrile.  Blood work showed mild hypokalemia which has been replaced but otherwise no leukocytosis or any other significant findings.  She is back to baseline, ambulatory, worked well with PT, eating without difficulties and will be discharged home in stable condition  Active problems Dementia-continue home medications Hyperlipidemia-continue home medications Hypertension-continue home medications Depression-continue Zoloft  Ulcerative colitis-continue mesalamine , she has no abdominal pain, diarrhea and this seems to be controlled  Sepsis ruled out   Discharge Instructions   Allergies as of 11/13/2024       Reactions   Codeine Other (See Comments)   Patient does not recall any reaction to this medication        Medication List     STOP taking these medications    oseltamivir 75 MG capsule Commonly known as: TAMIFLU       TAKE these medications    amLODipine  5 MG tablet Commonly known as: NORVASC  Take 5 mg by mouth daily.   aspirin EC 81 MG tablet Take 81 mg by mouth daily.   atorvastatin  20 MG tablet Commonly known as: LIPITOR Take 20 mg by mouth daily.   Centrum Silver 50+Women Tabs Take 1 tablet by mouth daily.   losartan  100 MG tablet Commonly known as: COZAAR  Take 100  mg by mouth daily.   memantine  10 MG tablet Commonly known as: NAMENDA  Take 1 tablet  twice a day   mesalamine  1.2 g EC tablet Commonly known as: LIALDA  Take 2 tablets (2.4 g total) by mouth daily with breakfast.   ondansetron  4 MG disintegrating tablet Commonly known as: ZOFRAN -ODT Take 1 tablet (4 mg total) by mouth every 8 (eight) hours as needed for nausea or vomiting.   OVER THE COUNTER MEDICATION Take 1 capsule by mouth daily. OTC;  [employer]: Ultimate Colon Care Formula   pantoprazole  40 MG tablet Commonly known as: PROTONIX  Take 1 tablet (40 mg total) by mouth daily before breakfast.   Vitamin D3 50 MCG (2000 UT) capsule Take 2,000 Units by mouth daily.        Follow-up Information     Erica Kins, MD Follow up in 1 week(s).   Specialty: Family Medicine Contact information: [address] [place] [address] [phone]                 Consultations: none  Procedures/Studies:   MR BRAIN WO CONTRAST Result Date: 11/13/2024 EXAM: MRI BRAIN WITHOUT CONTRAST 11/13/2024 10:55:33 AM TECHNIQUE: Multiplanar multisequence MRI of the head/brain was performed without the administration of intravenous contrast. COMPARISON: Brain MRI 09/16/2022, Head CT yesterday. CLINICAL HISTORY: 89 year old female with cough and altered mental status. FINDINGS: BRAIN AND VENTRICLES: Brain volume is stable since 2023. No acute infarct. No intracranial hemorrhage. No mass. No midline shift. No hydrocephalus. Major intracranial vascular flow voids are stable, with generalized arterial tortuosity and dominant distal left vertebral artery. 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 Brain volume is stable since 2023. Chronic widely scattered cerebral white matter T2 and FLAIR hyperintensity is moderately advanced, stable. No cortical encephalomalacia or chronic cerebral blood products identified. Comparatively mild chronic T2 heterogeneity throughout the deep gray matter nuclei, stable. Brainstem and cerebellum negative for age. The sella is unremarkable. ORBITS: No acute abnormality. SINUSES AND MASTOIDS: Chronic paranasal sinus disease, stable. Mastoids remain well aerated. Grossly normal visible internal auditory structures. BONES AND SOFT TISSUES: Normal  marrow signal. No acute soft tissue abnormality. Negative visible cervical spine. IMPRESSION: 1. No acute intracranial abnormality. Stable since 2023 non-contrast MRI appearance of the brain Electronically signed by: Erica Hurst MD 11/13/2024 11:36 AM EST RP Workstation: [id number]   CT Chest W Contrast Result Date: 11/12/2024 EXAM: CT CHEST WITH CONTRAST 11/12/2024 09:07:00 PM TECHNIQUE: CT of the chest was performed with the administration of 50 mL of iohexol  (OMNIPAQUE ) 350 MG/ML injection. Multiplanar reformatted images are provided for review. Automated exposure control, iterative reconstruction, and/or weight based adjustment of the mA/kV was utilized to reduce the radiation dose to as low as reasonably achievable. COMPARISON: CT chest 09/02/2022. CLINICAL HISTORY: Pneumonia, complication suspected, xray done. FINDINGS: MEDIASTINUM: Inferior left thyroid  nodule measures 15 mm. Coronary and aortic atherosclerotic calcifications are noted. Heart and pericardium are unremarkable. The central airways are clear. LYMPH NODES: No mediastinal, hilar or axillary lymphadenopathy. LUNGS AND PLEURA: Minimal atelectatic changes in both lung bases. There are a few scattered 1 to 2 mm nodular densities. There is a 3 mm right lower lobe nodule image 3/84 which is unchanged from 2023 and favored as benign. No focal consolidation or pulmonary edema. No pleural effusion or pneumothorax. SOFT TISSUES/BONES: Multilevel degenerative changes affect the spine. No acute abnormality of the  bones or soft tissues. UPPER ABDOMEN: Limited images of the upper abdomen demonstrates a 2.9 cm right renal cyst. No acute abnormality. IMPRESSION: 1. No acute findings. 2. 15 mm inferior left thyroid  nodule, recommend non-emergent thyroid  ultrasound. 3. scattered nodular densities measuring 3 mm or less. No routine follow-up imaging is recommended per Fleischner Society Guidelines. Electronically signed by: Greig Pique MD 11/12/2024 09:34 PM EST  RP Workstation: [id number]   CT Head Wo Contrast Result Date: 11/12/2024 EXAM: CT HEAD WITHOUT 11/12/2024 06:53:47 PM TECHNIQUE: CT of the head was performed without the administration of intravenous contrast. Automated exposure control, iterative reconstruction, and/or weight based adjustment of the mA/kV was utilized to reduce the radiation dose to as low as reasonably achievable. COMPARISON: 09/02/2022 CLINICAL HISTORY: Mental status change, unknown cause FINDINGS: BRAIN AND VENTRICLES: No acute intracranial hemorrhage. No mass effect or midline shift. No extra-axial fluid collection. No evidence of acute infarct. No hydrocephalus. Atrophy and chronic small vessel disease throughout the deep white matter. ORBITS: No acute abnormality. SINUSES AND MASTOIDS: No acute abnormality. SOFT TISSUES AND SKULL: No acute skull fracture. No acute soft tissue abnormality. IMPRESSION: 1. No acute intracranial abnormality. 2. Atrophy and chronic small vessel disease throughout the deep white matter. Electronically signed by: Franky Crease MD 11/12/2024 07:33 PM EST RP Workstation: [id number]   DG Chest Portable 1 View Result Date: 11/12/2024 EXAM: 1 VIEW(S) XRAY OF THE CHEST 11/12/2024 05:18:00 PM COMPARISON: None available. CLINICAL HISTORY: ?PNA, AMS FINDINGS: LUNGS AND PLEURA: Elevated right hemidiaphragm. No focal pulmonary opacity. No pleural effusion. No pneumothorax. HEART AND MEDIASTINUM: No acute abnormality of the cardiac and mediastinal silhouettes. BONES AND SOFT TISSUES: Multilevel degenerative changes of thoracolumbar spine. Scoliosis. IMPRESSION: 1. No acute findings. Electronically signed by: Greig Pique MD 11/12/2024 06:23 PM EST RP Workstation: [id number]     Subjective: - no chest pain, shortness of breath, no abdominal pain, nausea or vomiting.   Discharge Exam: BP (!) 160/77 (BP Location: Right Arm)   Pulse 68   Temp 98 F (36.7 C) (Oral)   Resp 16   Ht 4' 11 (1.499 m)   Wt 53.5 kg   SpO2  100%   BMI 23.83 kg/m   General: Pt is alert, awake, not in acute distress Cardiovascular: RRR, S1/S2 +, no rubs, no gallops Respiratory: CTA bilaterally, no wheezing, no rhonchi Abdominal: Soft, NT, ND, bowel sounds + Extremities: no edema, no cyanosis    The results of significant diagnostics from this hospitalization (including imaging, microbiology, ancillary and laboratory) are listed below for reference.     Microbiology: No results found for this or any previous visit (from the past 240 hours).   Labs: Basic Metabolic Panel: Recent Labs  Lab 11/12/24 1813 11/13/24 0426  NA 142 142  K 3.8 3.0*  CL 109 106  CO2 21* 26  GLUCOSE 96 120*  BUN 11 9  CREATININE 0.68 0.68  CALCIUM  8.4* 8.6*  MG  --  1.9  PHOS  --  2.7   Liver Function Tests: Recent Labs  Lab 11/12/24 1813 11/13/24 0426  AST 39 24  ALT 8 10  ALKPHOS 112 104  BILITOT 0.7 0.6  PROT 5.9* 5.9*  ALBUMIN 2.9* 3.2*   CBC: Recent Labs  Lab 11/12/24 2035 11/13/24 0426  WBC 7.5 6.9  NEUTROABS 4.7  --   HGB 14.9 13.7  HCT 45.6 42.1  MCV 88.9 90.3  PLT 279 249   CBG: Recent Labs  Lab 11/12/24 1720  GLUCAP 99  Hgb A1c No results for input(s): HGBA1C in the last 72 hours. Lipid Profile No results for input(s): CHOL, HDL, LDLCALC, TRIG, CHOLHDL, LDLDIRECT in the last 72 hours. Thyroid  function studies No results for input(s): TSH, T4TOTAL, T3FREE, THYROIDAB in the last 72 hours.  Invalid input(s): FREET3 Urinalysis    Component Value Date/Time   COLORURINE AMBER (A) 11/12/2024 1658   APPEARANCEUR CLEAR 11/12/2024 1658   LABSPEC 1.019 11/12/2024 1658   PHURINE 6.0 11/12/2024 1658   GLUCOSEU NEGATIVE 11/12/2024 1658   HGBUR NEGATIVE 11/12/2024 1658   BILIRUBINUR NEGATIVE 11/12/2024 1658   KETONESUR NEGATIVE 11/12/2024 1658   PROTEINUR NEGATIVE 11/12/2024 1658   NITRITE NEGATIVE 11/12/2024 1658   LEUKOCYTESUR NEGATIVE 11/12/2024 1658    FURTHER DISCHARGE  INSTRUCTIONS:   Get Medicines reviewed and adjusted: Please take all your medications with you for your next visit with your Primary MD   Laboratory/radiological data: Please request your Primary MD to go over all hospital tests and procedure/radiological results at the follow up, please ask your Primary MD to get all Hospital records sent to his/her office.   In some cases, they will be blood work, cultures and biopsy results pending at the time of your discharge. Please request that your primary care M.D. goes through all the records of your hospital data and follows up on these results.   Also Note the following: If you experience worsening of your admission symptoms, develop shortness of breath, life threatening emergency, suicidal or homicidal thoughts you must seek medical attention immediately by calling 911 or calling your MD immediately  if symptoms less severe.   You must read complete instructions/literature along with all the possible adverse reactions/side effects for all the Medicines you take and that have been prescribed to you. Take any new Medicines after you have completely understood and accpet all the possible adverse reactions/side effects.    Do not drive when taking Pain medications or sleeping medications (Benzodaizepines)   Do not take more than prescribed Pain, Sleep and Anxiety Medications. It is not advisable to combine anxiety,sleep and pain medications without talking with your primary care practitioner   Special Instructions: If you have smoked or chewed Tobacco  in the last 2 yrs please stop smoking, stop any regular Alcohol  and or any Recreational drug use.   Wear Seat belts while driving.   Please note: You were cared for by a hospitalist during your hospital stay. Once you are discharged, your primary care physician will handle any further medical issues. Please note that NO REFILLS for any discharge medications will be authorized once you are discharged,  as it is imperative that you return to your primary care physician (or establish a relationship with a primary care physician if you do not have one) for your post hospital discharge needs so that they can reassess your need for medications and monitor your lab values.  Time coordinating discharge: 35 minutes  SIGNED:  Nilda Fendt, MD, PhD 11/13/2024, 1:35 PM   "

## 2024-11-13 NOTE — Progress Notes (Signed)
 Transition of Care [facility]) - Inpatient Brief Assessment   Patient Details  Name: Erica Carter MRN: [id number] Date of Birth: Mar 14, 1935  [facility] CM/SW Contact:    Rosaline JONELLE Joe, RN Phone Number: 11/13/2024, 1:55 PM   Clinical Narrative: CM met with the patient and daughter at the bedside to discuss IP Care management needs.  The patient lives with daughter at the home and plans to return home today.  Discharge order was placed by the MD.  Patient's daughter states that she would like a rolator.  DME order for Rolator was placed - to be co-signed by MD.  Patient was offered Medicare choice regarding DMe agency and daughter/patient did not have a preference.  [employer] was called to deliver Rolator to the hospital room prior to discharge.  Medicare Observation letter given  and signed by daughter.  Daughter to provide transportation to home by car once Rolator delivered to the hospital room.   Transition of Care Asessment: Insurance and Status: (P) Insurance coverage has been reviewed Patient has primary care physician: (P) Yes Home environment has been reviewed: (P) from home with daughter Prior level of function:: (P) self   Social Drivers of Health Review: (P) SDOH reviewed interventions complete Readmission risk has been reviewed: (P) Yes Transition of care needs: (P) transition of care needs identified, TOC will continue to follow

## 2024-11-13 NOTE — Evaluation (Signed)
 Physical Therapy Evaluation Patient Details Name: Erica Carter MRN: [id number] DOB: 11/13/1934 Today's Date: 11/13/2024  History of Present Illness  89 y.o. female presents to [facility] hospital on 11/12/2024 with AMS. Pt was recently diagnosed with the flu on 12/27 and treated with Tamiflu. PMH includes dementia, HTN, HLD, GERD.  Clinical Impression  Pt presents to PT with deficits in gait, balance, endurance, although pt's daughter reports these are more chronic in nature and have been present over the last few months. Pt is able to ambulate for household distances unassisted. Pt's daughter reports that with fatigue the patient has required physical assistance to maintain balance. PT recommends a rollator to allow for energy conservation when mobilizing in the community.        If plan is discharge home, recommend the following: Assistance with cooking/housework;Assist for transportation;Help with stairs or ramp for entrance;Supervision due to cognitive status;Direct supervision/assist for medications management;Direct supervision/assist for financial management   Can travel by private vehicle        Equipment Recommendations Rollator (4 wheels)  Recommendations for Other Services       Functional Status Assessment Patient has had a recent decline in their functional status and demonstrates the ability to make significant improvements in function in a reasonable and predictable amount of time.     Precautions / Restrictions Precautions Precautions: Fall Recall of Precautions/Restrictions: Impaired Restrictions Weight Bearing Restrictions Per Provider Order: No      Mobility  Bed Mobility Overal bed mobility: Modified Independent                  Transfers Overall transfer level: Needs assistance Equipment used: None Transfers: Sit to/from Stand Sit to Stand: Supervision                Ambulation/Gait Ambulation/Gait assistance: Supervision Gait Distance (Feet):  200 Feet Assistive device: None Gait Pattern/deviations: Step-through pattern, Decreased dorsiflexion - left Gait velocity: reduced Gait velocity interpretation: <1.8 ft/sec, indicate of risk for recurrent falls   General Gait Details: pt with slowed step-through gait, increased lateral drift and decreased foot clearance of LLE  Stairs            Wheelchair Mobility     Tilt Bed    Modified Rankin (Stroke Patients Only)       Balance Overall balance assessment: Needs assistance Sitting-balance support: No upper extremity supported, Feet supported Sitting balance-Leahy Scale: Good     Standing balance support: No upper extremity supported, During functional activity Standing balance-Leahy Scale: Good                               Pertinent Vitals/Pain Pain Assessment Pain Assessment: No/denies pain    Home Living Family/patient expects to be discharged to:: Private residence Living Arrangements: Children Available Help at Discharge: Family;Available 24 hours/day Type of Home: House Home Access: Stairs to enter Entrance Stairs-Rails: Can reach both Entrance Stairs-Number of Steps: 3   Home Layout: One level Home Equipment: None      Prior Function Prior Level of Function : Needs assist             Mobility Comments: independent with mobility, daughter does report endurance deficits since the summer with PRN assistance when fatigued ADLs Comments: assistance for IADLs     Extremity/Trunk Assessment   Upper Extremity Assessment Upper Extremity Assessment: Overall WFL for tasks assessed    Lower Extremity Assessment Lower Extremity Assessment: Generalized weakness  Cervical / Trunk Assessment Cervical / Trunk Assessment: Kyphotic  Communication   Communication Communication: No apparent difficulties    Cognition Arousal: Alert Behavior During Therapy: WFL for tasks assessed/performed   PT - Cognitive impairments: History of  cognitive impairments                       PT - Cognition Comments: pt with dementia at baseline, oritented x 3 Following commands: Intact       Cueing Cueing Techniques: Verbal cues     General Comments General comments (skin integrity, edema, etc.): VSS on RA    Exercises     Assessment/Plan    PT Assessment Patient needs continued PT services  PT Problem List Decreased activity tolerance;Decreased balance;Decreased mobility;Decreased strength       PT Treatment Interventions DME instruction;Gait training;Stair training;Functional mobility training;Therapeutic activities;Therapeutic exercise;Balance training;Neuromuscular re-education;Patient/family education    PT Goals (Current goals can be found in the Care Plan section)  Acute Rehab PT Goals Patient Stated Goal: to return home PT Goal Formulation: With patient/family Time For Goal Achievement: 11/27/24 Potential to Achieve Goals: Good    Frequency Min 1X/week     Co-evaluation               AM-PAC PT 6 Clicks Mobility  Outcome Measure Help needed turning from your back to your side while in a flat bed without using bedrails?: None Help needed moving from lying on your back to sitting on the side of a flat bed without using bedrails?: None Help needed moving to and from a bed to a chair (including a wheelchair)?: A Little Help needed standing up from a chair using your arms (e.g., wheelchair or bedside chair)?: A Little Help needed to walk in hospital room?: A Little Help needed climbing 3-5 steps with a railing? : A Little 6 Click Score: 20    End of Session Equipment Utilized During Treatment: Gait belt Activity Tolerance: Patient tolerated treatment well Patient left: in bed;with call bell/phone within reach;with bed alarm set;with family/visitor present Nurse Communication: Mobility status PT Visit Diagnosis: Other abnormalities of gait and mobility (R26.89);Muscle weakness (generalized)  (M62.81)    Time: 1310-1330 PT Time Calculation (min) (ACUTE ONLY): 20 min   Charges:   PT Evaluation $PT Eval Low Complexity: 1 Low   PT General Charges $$ ACUTE PT VISIT: 1 Visit         Bernardino JINNY Ruth, PT, DPT Acute Rehabilitation Office [phone]   Bernardino JINNY Ruth 11/13/2024, 4:47 PM

## 2024-11-13 NOTE — Progress Notes (Signed)
 Physical Therapy Quick Note  PT has completed initial evaluation.    Overall, patient at supervision assistance level.   PT Follow up recommended: No follow up PT Equipment recommended:  Rollator Complete evaluation note to follow.     Bernardino JINNY Ruth, PT, DPT Acute Rehabilitation Office [phone]

## 2024-11-13 NOTE — Discharge Instructions (Signed)
Follow with Lupita Raider, MD in 5-7 days  Please get a complete blood count and chemistry panel checked by your Primary MD at your next visit, and again as instructed by your Primary MD. Please get your medications reviewed and adjusted by your Primary MD.  Please request your Primary MD to go over all Hospital Tests and Procedure/Radiological results at the follow up, please get all Hospital records sent to your Prim MD by signing hospital release before you go home.  In some cases, there will be blood work, cultures and biopsy results pending at the time of your discharge. Please request that your primary care M.D. goes through all the records of your hospital data and follows up on these results.  If you had Pneumonia of Lung problems at the Hospital: Please get a 2 view Chest X ray done in 6-8 weeks after hospital discharge or sooner if instructed by your Primary MD.  If you have Congestive Heart Failure: Please call your Cardiologist or Primary MD anytime you have any of the following symptoms:  1) 3 pound weight gain in 24 hours or 5 pounds in 1 week  2) shortness of breath, with or without a dry hacking cough  3) swelling in the hands, feet or stomach  4) if you have to sleep on extra pillows at night in order to breathe  Follow cardiac low salt diet and 1.5 lit/day fluid restriction.  If you have diabetes Accuchecks 4 times/day, Once in AM empty stomach and then before each meal. Log in all results and show them to your primary doctor at your next visit. If any glucose reading is under 80 or above 300 call your primary MD immediately.  If you have Seizure/Convulsions/Epilepsy: Please do not drive, operate heavy machinery, participate in activities at heights or participate in high speed sports until you have seen by Primary MD or a Neurologist and advised to do so again. Per [facility] statutes, patients with seizures are not allowed to drive until they have been  seizure-free for six months.  Use caution when using heavy equipment or power tools. Avoid working on ladders or at heights. Take showers instead of baths. Ensure the water temperature is not too high on the home water heater. Do not go swimming alone. Do not lock yourself in a room alone (i.e. bathroom). When caring for infants or small children, sit down when holding, feeding, or changing them to minimize risk of injury to the child in the event you have a seizure. Maintain good sleep hygiene. Avoid alcohol.   If you had Gastrointestinal Bleeding: Please ask your Primary MD to check a complete blood count within one week of discharge or at your next visit. Your endoscopic/colonoscopic biopsies that are pending at the time of discharge, will also need to followed by your Primary MD.  Get Medicines reviewed and adjusted. Please take all your medications with you for your next visit with your Primary MD  Please request your Primary MD to go over all hospital tests and procedure/radiological results at the follow up, please ask your Primary MD to get all Hospital records sent to his/her office.  If you experience worsening of your admission symptoms, develop shortness of breath, life threatening emergency, suicidal or homicidal thoughts you must seek medical attention immediately by calling 911 or calling your MD immediately  if symptoms less severe.  You must read complete instructions/literature along with all the possible adverse reactions/side effects for all the Medicines you take  and that have been prescribed to you. Take any new Medicines after you have completely understood and accpet all the possible adverse reactions/side effects.   Do not drive or operate heavy machinery when taking Pain medications.   Do not take more than prescribed Pain, Sleep and Anxiety Medications  Special Instructions: If you have smoked or chewed Tobacco  in the last 2 yrs please stop smoking, stop any regular  Alcohol  and or any Recreational drug use.  Wear Seat belts while driving.  Please note You were cared for by a hospitalist during your hospital stay. If you have any questions about your discharge medications or the care you received while you were in the hospital after you are discharged, you can call the unit and asked to speak with the hospitalist on call if the hospitalist that took care of you is not available. Once you are discharged, your primary care physician will handle any further medical issues. Please note that NO REFILLS for any discharge medications will be authorized once you are discharged, as it is imperative that you return to your primary care physician (or establish a relationship with a primary care physician if you do not have one) for your aftercare needs so that they can reassess your need for medications and monitor your lab values.  You can reach the hospitalist office at phone [phone] or fax [phone]   If you do not have a primary care physician, you can call [phone] for a physician referral.  Activity: As tolerated with Full fall precautions use walker/cane & assistance as needed    Diet: regular  Disposition Home

## 2024-11-15 LAB — VITAMIN B1: Vitamin B1 (Thiamine): 165.3 nmol/L (ref 66.5–200.0)

## 2025-03-26 ENCOUNTER — Ambulatory Visit: Admitting: Physician Assistant
